# Patient Record
Sex: Female | Born: 1935 | Race: White | Hispanic: No | Marital: Married | Smoking: Never smoker
Health system: Southern US, Community
[De-identification: ages and names within clinical notes are randomized; demographics above are authoritative.]

## PROBLEM LIST (undated history)

## (undated) DIAGNOSIS — M810 Age-related osteoporosis without current pathological fracture: Secondary | ICD-10-CM

## (undated) DIAGNOSIS — I10 Essential (primary) hypertension: Secondary | ICD-10-CM

## (undated) DIAGNOSIS — N183 Chronic kidney disease, stage 3 unspecified: Secondary | ICD-10-CM

## (undated) DIAGNOSIS — Z95818 Presence of other cardiac implants and grafts: Secondary | ICD-10-CM

## (undated) DIAGNOSIS — Z8719 Personal history of other diseases of the digestive system: Secondary | ICD-10-CM

## (undated) DIAGNOSIS — M1990 Unspecified osteoarthritis, unspecified site: Secondary | ICD-10-CM

## (undated) DIAGNOSIS — I442 Atrioventricular block, complete: Secondary | ICD-10-CM

## (undated) DIAGNOSIS — E785 Hyperlipidemia, unspecified: Secondary | ICD-10-CM

## (undated) DIAGNOSIS — Z87898 Personal history of other specified conditions: Secondary | ICD-10-CM

## (undated) DIAGNOSIS — M419 Scoliosis, unspecified: Secondary | ICD-10-CM

## (undated) DIAGNOSIS — R0989 Other specified symptoms and signs involving the circulatory and respiratory systems: Secondary | ICD-10-CM

## (undated) DIAGNOSIS — K219 Gastro-esophageal reflux disease without esophagitis: Secondary | ICD-10-CM

## (undated) DIAGNOSIS — L602 Onychogryphosis: Secondary | ICD-10-CM

## (undated) DIAGNOSIS — N3020 Other chronic cystitis without hematuria: Secondary | ICD-10-CM

## (undated) DIAGNOSIS — R079 Chest pain, unspecified: Secondary | ICD-10-CM

## (undated) DIAGNOSIS — F0390 Unspecified dementia without behavioral disturbance: Secondary | ICD-10-CM

## (undated) DIAGNOSIS — K449 Diaphragmatic hernia without obstruction or gangrene: Secondary | ICD-10-CM

## (undated) DIAGNOSIS — M2040 Other hammer toe(s) (acquired), unspecified foot: Secondary | ICD-10-CM

## (undated) DIAGNOSIS — M549 Dorsalgia, unspecified: Secondary | ICD-10-CM

## (undated) DIAGNOSIS — D509 Iron deficiency anemia, unspecified: Secondary | ICD-10-CM

## (undated) DIAGNOSIS — N859 Noninflammatory disorder of uterus, unspecified: Secondary | ICD-10-CM

## (undated) DIAGNOSIS — N390 Urinary tract infection, site not specified: Secondary | ICD-10-CM

## (undated) DIAGNOSIS — R55 Syncope and collapse: Secondary | ICD-10-CM

## (undated) DIAGNOSIS — G8929 Other chronic pain: Secondary | ICD-10-CM

## (undated) HISTORY — PX: JOINT REPLACEMENT: SHX530

## (undated) HISTORY — DX: Chest pain, unspecified: R07.9

## (undated) HISTORY — PX: TUBAL LIGATION: SHX77

## (undated) HISTORY — DX: Atrioventricular block, complete: I44.2

## (undated) HISTORY — DX: Scoliosis, unspecified: M41.9

## (undated) HISTORY — PX: CATARACT EXTRACTION, BILATERAL: SHX1313

## (undated) HISTORY — DX: Hyperlipidemia, unspecified: E78.5

## (undated) HISTORY — DX: Onychogryphosis: L60.2

## (undated) HISTORY — DX: Diaphragmatic hernia without obstruction or gangrene: K44.9

## (undated) HISTORY — DX: Noninflammatory disorder of uterus, unspecified: N85.9

## (undated) HISTORY — DX: Other hammer toe(s) (acquired), unspecified foot: M20.40

## (undated) HISTORY — DX: Age-related osteoporosis without current pathological fracture: M81.0

## (undated) HISTORY — DX: Personal history of other specified conditions: Z87.898

## (undated) HISTORY — DX: Other specified symptoms and signs involving the circulatory and respiratory systems: R09.89

## (undated) HISTORY — PX: LOOP RECORDER IMPLANT: SHX5954

## (undated) HISTORY — DX: Presence of other cardiac implants and grafts: Z95.818

---

## 2006-01-17 ENCOUNTER — Ambulatory Visit (HOSPITAL_COMMUNITY): Admission: RE | Admit: 2006-01-17 | Discharge: 2006-01-17 | Payer: Self-pay | Admitting: Orthopedic Surgery

## 2006-01-17 ENCOUNTER — Encounter: Payer: Self-pay | Admitting: Vascular Surgery

## 2006-02-21 ENCOUNTER — Ambulatory Visit: Admission: RE | Admit: 2006-02-21 | Discharge: 2006-02-21 | Payer: Self-pay | Admitting: Orthopedic Surgery

## 2006-05-10 HISTORY — PX: TOTAL HIP ARTHROPLASTY: SHX124

## 2006-06-03 ENCOUNTER — Encounter (INDEPENDENT_AMBULATORY_CARE_PROVIDER_SITE_OTHER): Payer: Self-pay | Admitting: *Deleted

## 2006-06-03 ENCOUNTER — Inpatient Hospital Stay (HOSPITAL_COMMUNITY): Admission: RE | Admit: 2006-06-03 | Discharge: 2006-06-07 | Payer: Self-pay | Admitting: Orthopedic Surgery

## 2006-06-04 ENCOUNTER — Ambulatory Visit: Payer: Self-pay | Admitting: Physical Medicine & Rehabilitation

## 2008-06-21 ENCOUNTER — Emergency Department (HOSPITAL_COMMUNITY): Admission: EM | Admit: 2008-06-21 | Discharge: 2008-06-22 | Payer: Self-pay | Admitting: Emergency Medicine

## 2009-11-28 ENCOUNTER — Ambulatory Visit: Payer: Self-pay | Admitting: Vascular Surgery

## 2011-01-22 NOTE — Procedures (Signed)
DUPLEX DEEP VENOUS EXAM - LOWER EXTREMITY   INDICATION:  History of DVT with pain and swelling.   HISTORY:  Edema:  Yes.  Trauma/Surgery:  No.  Pain:  Yes.  PE:  No.  Previous DVT:  Yes.  Anticoagulants:  None.  Other:   DUPLEX EXAM:                CFV   SFV   PopV  PTV    GSV                R  L  R  L  R  L  R   L  R  L  Thrombosis    o  o     o     +      o     o  Spontaneous   +  +     +     p      o     +  Phasic        +  +     +     p      o     +  Augmentation  +  +     +     p      o     +  Compressible  +  +     +     p      o     +  Competent     +  +     +     p      o     +   Legend:  + - yes  o - no  p - partial  D - decreased   IMPRESSION:  Deep venous thrombosis with undetermined age noted in the  left popliteal, peroneal, and posterior tibial veins.    _____________________________  Quita Skye Hart [place], M.D.   MG/MEDQ  D:  11/28/2009  T:  11/29/2009  Job:  [id number]

## 2011-01-22 NOTE — Consult Note (Signed)
NEW PATIENT CONSULTATION   Victoria Lewis, Victoria Lewis  DOB:  04/04/1928                                       11/28/2009  ZOXWR#:[id number]   The patient is an 77 year old female referred by Dr. Tomasa Blase for lower  extremity edema, left worse than right.  This patient apparently began  having some swelling in both legs with the left worse than the right a  few months ago according to her husband.  She has no known history of  DVT, thrombophlebitis and has no history of stasis ulcers, painful  varicosities or superficial thrombophlebitis.  She does not wear elastic  compression stockings.  She has been treated with diuretics over the  past few months with significant improvement in her swelling and states  that at the present time she is not having pain in her feet and ankles  as she was a few months ago.  I have reviewed her records from [facility] including an ultrasound report done by [facility]  11/01/2009 which revealed some nonocclusive thrombus in the left  popliteal vein but otherwise no evidence of DVT in the rest of the left  leg or in the entire right leg.   CHRONIC MEDICAL PROBLEMS:  1. Hypertension.  2. Degenerative joint disease.  3. Previous left hip replacement.  4. Osteopenia.  5. Hyperlipidemia.  6. Negative for diabetes, coronary artery disease, COPD or stroke.   FAMILY HISTORY:  Positive for coronary artery disease and stroke in her  mother.  Negative for diabetes.   SOCIAL HISTORY:  She is married.  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  She does not use tobacco or alcohol.   REVIEW OF SYSTEMS:  Positive for weight loss and gradual loss of  appetite.  Negative for chest pain, dyspnea on exertion.  Does have  urinary frequency.  Lower extremity discomfort in both legs with  walking, joint pain, muscle pain and poor hearing.   PHYSICAL EXAM:  General:  She is an elderly frail-appearing female who  is in no apparent  distress, alert and oriented x3.  HEENT:  EOMs intact.  Conjunctivae normal.  Lungs:  Are clear with no wheezing or rhonchi.  Cardiovascular:  Regular rhythm.  No murmurs.  Carotid pulses 3+.  No  audible bruits.  She has 2+ pulses in all extremities.  Abdomen:  Soft,  nontender with no masses.  Musculoskeletal:  Exam reveals no major  deformities.  Neurologic:  Normal.  Skin:  Free of rashes.  Lower  extremity:  Exam reveals no bulging varicosities.  She does have 1+  edema in the left ankle.  No edema in the right ankle.  There is no  hyperpigmentation, ulceration or diffuse spider veins noted.   Today I ordered a repeat venous duplex exam in our office to compare  with the one done 1 month ago at [facility].  I reviewed and  interpreted this.  She has a DVT in the left popliteal pain of  undetermined origin with some posterior and peroneal vein involvement.  There is no reflux in the left great saphenous system.   She has a chronic DVT in the left leg which may be months or years old,  difficult to know at this time.  I would recommend treating her with an  81 mg aspirin tablet daily and  if she has problems with edema in the  left leg have her fitted for short leg elastic compression stockings.  No other treatment is necessary at this time.  I will see her back on a  p.r.n. basis.     Quita Skye Hart [place], M.D.  Electronically Signed   JDL/MEDQ  D:  11/28/2009  T:  11/29/2009  Job:  [id number]   cc:   Dr Foye Deer

## 2011-01-25 NOTE — H&P (Signed)
NAME:  Victoria Lewis, Victoria Lewis NO.:  [id number]   MEDICAL RECORD NO.:  [id number]            PATIENT TYPE:   LOCATION:                                 FACILITY:   PHYSICIAN:  Deidre Ala, M.D.         DATE OF BIRTH:   DATE OF ADMISSION:  DATE OF DISCHARGE:                              HISTORY & PHYSICAL   CHIEF COMPLAINT:  Left hip pain.   HISTORY OF PRESENT ILLNESS:  The patient is a 77 year old female with a  longstanding history of left hip pain.  She has failed conservative  treatment up to this point.  Radiographs in the office taken showed end-  stage osteoarthritis of the left hip.  Upon these findings and failure  of conservative treatment Dr. Renae Fickle felt it was best to proceed with a  left total hip arthroplasty.  The patient agreed.  The risks and  benefits of the surgery were discussed with the patient and the patient  wished to proceed.   DRUG ALLERGIES:  NO KNOWN DRUG ALLERGIES.   CURRENT MEDICATIONS:  1. Lipitor 20 mg p.o. daily.  2. Zetia 10 mg p.o. daily.  3. Cozaar 100 mg p.o. daily.  4. Metoprolol 50 mg p.o. daily.  5. Fosamax 70 mg p.o. q. week.  6. Calcium one p.o. b.i.d.  7. Vicodin one p.o. q.i.d.  8. Fiber Choice two times daily.  9. B-12 injection once a month.  10.Tandem Plus capsule one p.o. b.i.d.   PAST MEDICAL HISTORY:  1. Hypercholesterolemia.  2. Hypertension.   PAST SURGICAL HISTORY:  Bilateral tubal ligation.   SOCIAL HISTORY:  The patient denies any tobacco or alcohol use.  She is  married.  She also lives in a one story house with 3 steps.   FAMILY HISTORY:  Positive for CVA, hypertension and cancer.   REVIEW OF SYSTEMS:  UROLOGICAL:  Positive for frequency and nocturia.   PHYSICAL EXAMINATION:  Pulse is 68, respirations 20, blood pressure  130/90.  GENERAL:  A well-developed, well-nourished 77 year old female awake,  alert and oriented and cooperative.  HEENT:  Normocephalic, atraumatic.  Pupils equal, round,  reactive to  light.  NECK:  Supple.  No carotid bruits noted.  CHEST:  Clear to auscultation bilaterally.  No wheezes, rhonchi or  crackles.  HEART:  Regular rate and rhythm.  No murmur, rub or gallop.  ABDOMEN:  Soft, nontender, nondistended.  Positive bowel sounds x4.  EXTREMITIES:  Groin pain with range of motion of her left hip.  She  walks with a Trendelenburg gait.  She is neurovascularly intact distally  to the left lower extremity.  SKIN:  Within normal limits.   RADIOGRAPHS:  Show end-stage osteoarthritis of the left hip with bone on  bone contact in left hip joint.   IMPRESSION:  End-stage osteoarthritis of the left hip.   PLAN:  The patient will be admitted to [facility] to undergo a  left total hip arthroplasty by Dr. Charlesetta Shanks.     ______________________________  Clarene Reamer, P.A.-C.    ______________________________  V. Charlesetta Shanks,  M.D.    SW/MEDQ  D:  07/22/2006  T:  07/22/2006  Job:  [id number]

## 2011-01-25 NOTE — Discharge Summary (Signed)
NAMEIRETHA, KIRLEY NO.:  [id number]   MEDICAL RECORD NO.:  [id number]          PATIENT TYPE:  INP   LOCATION:  1506                         FACILITY:  [facility]   PHYSICIAN:  Deidre Ala, M.D.    DATE OF BIRTH:  [birth year]   DATE OF ADMISSION:  06/03/2006  DATE OF DISCHARGE:  06/07/2006                                 DISCHARGE SUMMARY   ADMISSION DIAGNOSES:  1. End-stage osteoarthritis of the left hip.  2. Hypercholesterolemia.  3. Hypertension.   DISCHARGE DIAGNOSES:  1. End-stage osteoarthritis of the left hip status post left total hip      arthroplasty.  2. Hypercholesterolemia.  3. Hypertension.  4. Postop hemorrhagic anemia, stable at the time of discharge.   PROCEDURE:  The patient was taken to the operating room on June 03, 2006, and underwent a left total hip arthroplasty.   SURGEON:  1. Charlesetta Shanks, M.D.   ASSISTANT:  Clarene Reamer, P.A.-C.   ANESTHESIA:  General.   CONSULTATIONS:  Physical Therapy.  Occupational Therapy.  Social Work Case Management.  Physical Medicine Rehabilitation with Dr. Thomasena Edis.   BRIEF HISTORY:  The patient is a 77 year old female with a long-standing  history of left hip pain.  She has failed conservative treatment to this  point.  Radiographs in the office revealed end-stage osteoarthritis of the  left hip with deformity of the femoral head.  Upon these findings and  failure of conservative treatment, Dr. Renae Fickle thought that it was best to  proceed with a left total hip arthroplasty.  The patient agreed.  Risks and  benefits of the surgery were discussed with the patient and the patient  wished to proceed.   LABORATORY DATA:  CBC on admission showed a hemoglobin and hematocrit of  13.5 and 39.2, white blood cell count 7.1, red blood cell count 4.3.  Serial  H&Hs were performed throughout hospital stay.  Hemoglobin and hematocrit did  decline to 9.5 and 27 on September 28, but were stable at the time of  discharge.  Differential on admission was all within normal limits.  Coagulation studies on admission were all within normal limits.  PT INR at  the time of discharge were 21.7 and 1.8 respectively on Coumadin therapy.  Routine chemistry on admission showed a glucose high at 149.  Serial  chemistries were followed throughout hosp stay.  Glucose ranged from a low  of 100 to a high of 149.  Urinalysis showed a moderate amount of leucocyte  esterase with few epithelials.  The patient's blood type is O+ with antibody  screen negative.  Postop hip x-rays revealed left total hip arthroplasty  devices in place.  Hardware components are in anatomic alignment.   HOSPITAL COURSE:  The patient was admitted to [facility] and taken  to the operating room where she underwent the above stated procedure without  complications.  The patient tolerated the procedure well and was returned  from recovery room to the orthopedic floor continuing her postop care.  On  postop day #1, the patient was complaining of some pain as expected.  Hemoglobin and hematocrit are 10.5 and 30.2.  She is neurovascularly intact  in the left lower extremity.  Dressing was clean, dry and intact.  The  patient is to work with physical therapy and occupational therapy.  On  September 27, postop day #2, T-max is 99.1. Hemoglobin and hematocrit are  10.1 and 28.8.  Incision was clean dry and intact.  She was neurovascularly  intact in the left lower extremity.  PCA and Hemovac were discontinued on  this date.  She is to continue to work with physical therapy and  occupational therapy.  Rehab consult was seen.  It was felt that she would  be better for home health.  On September 28, postop day #3, the patient is  resting comfortably; still a little slow with therapy.  T-max is 99.1.  Hemoglobin and hematocrit are 9.5 and 27.  She is neurovascularly intact to  the left lower extremity.  Incision was clean, dry and intact.  The  patient  was to work with physical therapy for one more day with the plan to be  discharged home the following day.  On June 07, 2006, postop day #4,  the patient was doing well, ready to go home.  She was afebrile.  Neurovascularly intact to the left lower extremity.  Incision was clean, dry  and intact.  The patient was to be subsequently discharged home on this  date.   DISPOSITION:  The patient was discharged home on June 07, 2006.   DISCHARGE MEDICATIONS:  1. Percocet one to two p.o. q4-6h. p.r.n. pain.  2. Robaxin 500 mg one p.o. q.6-8h. p.r.n. spasm.  3. Coumadin per pharmacy protocol.   DISCHARGE DIET:  Diet as tolerated.   DISCHARGE ACTIVITIES:  The patient is weightbearing as tolerated to the left  lower extremity.   WOUND CARE:  The patient is to perform daily dressing changes until no  drainage.  She may shower when no drainage.   DISCHARGE FOLLOWUP:  The patient is to follow up with Dr. Renae Fickle 2 weeks from  the date of surgery.  The office is to be called for an appointment at [phone].   CONDITION ON DISCHARGE:  Stable and improved.     ______________________________  Clarene Reamer, P.A.-C.    ______________________________  V. Charlesetta Shanks, M.D.    SW/MEDQ  D:  06/24/2006  T:  06/24/2006  Job:  [id number]

## 2011-01-25 NOTE — Op Note (Signed)
Victoria Lewis, Victoria Lewis NO.:  [id number]   MEDICAL RECORD NO.:  [id number]          PATIENT TYPE:  INP   LOCATION:  1506                         FACILITY:  [facility]   PHYSICIAN:  Deidre Ala, M.D.    DATE OF BIRTH:  [birth year]   DATE OF PROCEDURE:  06/03/2006  DATE OF DISCHARGE:                                 OPERATIVE REPORT   PREOPERATIVE DIAGNOSIS:  1. End-stage DJD, left hip.  2. Large peri-acetabular cyst.   PROCEDURE:  1. Left total hip arthroplasty using uncemented DePuy components.  2. Excision of partial peri-acetabular cyst.   SURGEON:  1. Charlesetta Shanks, M.D.   ASSISTANT:  Clarene Reamer, [facility]   ANESTHESIA:  General endotracheal.   CULTURES:  None.   DRAINS:  Two medium Hemovacs to self suction.   ESTIMATED BLOOD LOSS:  300 cc.   HISTORY:  Victoria Lewis is a 77 year old female with long-term history of  degenerative joint disease of the left hip.   FINDINGS:  She had superior acetabular wear.  She had good bone medially  with intact columns.  She had preoperative  acetabular cyst that was quite  large. It does go up and down in size, according to the patient.  I did see  it in the anterior capsule and resected as much as I safely could and sent  it for pathology.  We were able to fit her with a 56 mm acetabulum, 36 mm  inside diameter, 10 degree acetabular liner +4, with a tapered hip stem,  size 5, 12/14 taper, standard offset, hole eliminator, acetabular sector cup  56 mm, and humeral head was 1+5, 36 mm.  We achieved push-pull stability.  We had hip extension to full with 90 degrees of flexion, 90 degrees of knee  flexion.  We had no instability to external rotation and extension, flexion  with internal rotation produced stability up to 85 to almost 90 degrees.  We  had the posterior lip at about 4 o'clock with very good anteversion of the  stem as well as the cup.   DESCRIPTION OF PROCEDURE:  After adequate anesthesia was obtained using  endotracheal technique, 1 gram Ancef given IV prophylaxis.  The patient was  placed in the right lateral decubitus position with the left side up.  After  standard prepping and draping of the left hip, we then did a standard Romilda Joy type posterior hip incision.  The incision was deepened sharply and  adequate hemostasis obtained using the Bovie electrocoagulator.  Dissection  was carried down to the iliotibial band and gluteus fascia which was incised  longitudinally.  We then placed a Charnley retractor.  I then internally  rotated the hip and resected the short hip flexors, hip external rotators.  I then cut and tagged the piriformis.  I then tagged the capsule and made a  capsulotomy with a flap based medial and reflected the capsule back.  I then  dislocated the femoral head and cleared the neck and piriformis area of soft  tissue.  I then made a cut using the broach template at mid neck and  removed  the femoral head.  I then exposed the acetabulum and did a labrectomy,  preserving the posterior capsule.  Retractors were placed and I started  reaming centrally,  I then reamed up in the more inferior mid acetabulum,  the true acetabulum just above the fovea centralis and reamed up to a 55.  I  then trialed a 54.  I then reamed with a 56 on the face. I then implanted  the 56 with good tightness.  I did use a sector cup but I did not need the  holes because it was very solid.  I then placed the 10 degree liner as  above, trial.  I then exposed the proximal femur.  I further shaved and  trimmed the neck.  I then did successive reaming to a #6.  I then broached  to a 5 with the appropriate anteversion, trying to stay lateral.  I then  placed the trial broach in place with the standard neck and +0 head and  articulated the hip through a range of motion with the above findings.  I  then removed the trial components, exposed the acetabulum, and implanted the  final acetabular insert +4, 10  degree liner, 36 mm inner diameter, 56 mm  outer diameter and impacted it.  I then exposed the proximal femur and  implanted the 5 standard tapered hip stem.  I then  impacted on the femoral  head trial and put it through a range of motion to +0.  I then implanted the  0 femoral head and cold welded it with the impaction.  The joint was then  articulated and put through a range of motion.  Thorough irrigation was  carried out.  I then placed Hemovac drains in the medial lateral gutters.  I  then closed the capsule flat back to the posterior trochanter using drill  holes with a horizontal mattress suture of double-stranded #1 Ethibond.  The  wound was then closed in layers with #1 Vicryl on the fascia and with a  running locking #1 PDS oversew, 0 and 2-0 Vicryl in the subcu, and skin  staples.  Hemovac was hooked up to Autovac.  The patient then had a knee  immobilizer placed to prevent dislocation.  The patient then, having  tolerated procedure well, was taken to recovery room in satisfactory  condition for routine postoperative care.           ______________________________  V. Charlesetta Shanks, M.D.     VEP/MEDQ  D:  06/03/2006  T:  06/05/2006  Job:  [id number]   cc:   Laurell Roof  Fax: [phone]

## 2011-06-11 LAB — DIFFERENTIAL
Basophils Absolute: 0
Basophils Relative: 0
Eosinophils Absolute: 0.1
Eosinophils Relative: 1
Lymphocytes Relative: 6 — ABNORMAL LOW
Lymphs Abs: 0.7
Monocytes Absolute: 0.7
Monocytes Relative: 6
Neutro Abs: 10.4 — ABNORMAL HIGH
Neutrophils Relative %: 87 — ABNORMAL HIGH

## 2011-06-11 LAB — LIPASE, BLOOD: Lipase: 26

## 2011-06-11 LAB — POCT CARDIAC MARKERS
CKMB, poc: 2.1
CKMB, poc: 2.4
Troponin i, poc: <0.05

## 2011-06-11 LAB — COMPREHENSIVE METABOLIC PANEL WITH GFR
BUN: 17
CO2: 29
Chloride: 100
Creatinine, Ser: 1.46 — ABNORMAL HIGH
GFR calc Af Amer: 42 — ABNORMAL LOW
Sodium: 136
Total Bilirubin: 0.8

## 2011-06-11 LAB — COMPREHENSIVE METABOLIC PANEL
ALT: 14
AST: 25
Albumin: 3.8
Alkaline Phosphatase: 81
Calcium: 9.2
GFR calc non Af Amer: 35 — ABNORMAL LOW
Glucose, Bld: 131 — ABNORMAL HIGH
Potassium: 4.5
Total Protein: 6.1

## 2011-06-11 LAB — URINALYSIS, ROUTINE W REFLEX MICROSCOPIC
Bilirubin Urine: NEGATIVE
Hgb urine dipstick: NEGATIVE
Nitrite: NEGATIVE
Specific Gravity, Urine: 1.01

## 2011-06-11 LAB — CBC
HCT: 37.2
Hemoglobin: 12.5
MCHC: 33.5
MCV: 96.2
Platelets: 228
RBC: 3.86 — ABNORMAL LOW
RDW: 13.1
WBC: 11.9 — ABNORMAL HIGH

## 2011-06-11 LAB — URINE MICROSCOPIC-ADD ON

## 2013-10-05 ENCOUNTER — Other Ambulatory Visit: Payer: Self-pay | Admitting: Orthopedic Surgery

## 2013-10-07 ENCOUNTER — Encounter (HOSPITAL_COMMUNITY): Payer: Self-pay | Admitting: Pharmacy Technician

## 2013-10-07 ENCOUNTER — Encounter (HOSPITAL_COMMUNITY)
Admission: RE | Admit: 2013-10-07 | Discharge: 2013-10-07 | Disposition: A | Discharge status: Home / Self Care | Payer: Medicare Other | Source: Ambulatory Visit | Attending: Orthopedic Surgery | Admitting: Orthopedic Surgery

## 2013-10-07 ENCOUNTER — Encounter (HOSPITAL_COMMUNITY): Payer: Self-pay

## 2013-10-07 DIAGNOSIS — Z01812 Encounter for preprocedural laboratory examination: Secondary | ICD-10-CM | POA: Insufficient documentation

## 2013-10-07 DIAGNOSIS — Z01810 Encounter for preprocedural cardiovascular examination: Secondary | ICD-10-CM | POA: Insufficient documentation

## 2013-10-07 DIAGNOSIS — Z01811 Encounter for preprocedural respiratory examination: Secondary | ICD-10-CM | POA: Insufficient documentation

## 2013-10-07 DIAGNOSIS — Z01818 Encounter for other preprocedural examination: Secondary | ICD-10-CM | POA: Insufficient documentation

## 2013-10-07 HISTORY — DX: Gastro-esophageal reflux disease without esophagitis: K21.9

## 2013-10-07 HISTORY — DX: Unspecified osteoarthritis, unspecified site: M19.90

## 2013-10-07 HISTORY — DX: Personal history of other diseases of the digestive system: Z87.19

## 2013-10-07 HISTORY — DX: Essential (primary) hypertension: I10

## 2013-10-07 LAB — PROTIME-INR
INR: 0.94 (ref 0.00–1.49)
Prothrombin Time: 12.4 seconds (ref 11.6–15.2)

## 2013-10-07 LAB — CBC WITH DIFFERENTIAL/PLATELET
BASOS PCT: 2 % — AB (ref 0–1)
Basophils Absolute: 0.1 10*3/uL (ref 0.0–0.1)
EOS ABS: 0.7 10*3/uL (ref 0.0–0.7)
Eosinophils Relative: 12 % — ABNORMAL HIGH (ref 0–5)
HEMATOCRIT: 29 % — AB (ref 36.0–46.0)
Hemoglobin: 9.5 g/dL — ABNORMAL LOW (ref 12.0–15.0)
Lymphocytes Relative: 16 % (ref 12–46)
Lymphs Abs: 1 10*3/uL (ref 0.7–4.0)
MCH: 26.5 pg (ref 26.0–34.0)
MCHC: 32.8 g/dL (ref 30.0–36.0)
MCV: 81 fL (ref 78.0–100.0)
MONO ABS: 0.6 10*3/uL (ref 0.1–1.0)
MONOS PCT: 9 % (ref 3–12)
NEUTROS ABS: 3.7 10*3/uL (ref 1.7–7.7)
Neutrophils Relative %: 61 % (ref 43–77)
Platelets: 361 10*3/uL (ref 150–400)
RBC: 3.58 MIL/uL — ABNORMAL LOW (ref 3.87–5.11)
RDW: 16.6 % — ABNORMAL HIGH (ref 11.5–15.5)
WBC: 6.1 10*3/uL (ref 4.0–10.5)

## 2013-10-07 LAB — BASIC METABOLIC PANEL
BUN: 16 mg/dL (ref 6–23)
CHLORIDE: 100 meq/L (ref 96–112)
CO2: 22 mEq/L (ref 19–32)
Calcium: 9 mg/dL (ref 8.4–10.5)
Creatinine, Ser: 1.19 mg/dL — ABNORMAL HIGH (ref 0.50–1.10)
GFR calc Af Amer: 48 mL/min — ABNORMAL LOW (ref >90)
GFR calc non Af Amer: 41 mL/min — ABNORMAL LOW (ref >90)
Glucose, Bld: 89 mg/dL (ref 70–99)
Potassium: 4.8 mEq/L (ref 3.7–5.3)
SODIUM: 137 meq/L (ref 137–147)

## 2013-10-07 LAB — ABO/RH: ABO/RH(D): O POS

## 2013-10-07 LAB — APTT: aPTT: 32 seconds (ref 24–37)

## 2013-10-07 LAB — SURGICAL PCR SCREEN
MRSA, PCR: NEGATIVE
Staphylococcus aureus: NEGATIVE

## 2013-10-07 NOTE — Pre-Procedure Instructions (Addendum)
Tera HelperBetty C Strieter  10/07/2013   Your procedure is scheduled on:  MONDAY, FEBRUARY 2.  [facility] Main [facility]trance / Entrance "A" at 9:30 AM.  Call this number if you have problems the morning of surgery: [phone]   Remember:   Do not eat food or drink liquids after midnight Sunday.   Take these medicines the morning of surgery with A SIP OF WATER: -Hydrocodone if needed for pain, Metoprolol, and Prilosec. Stop Aspirin, Vitamins,  herbal medication and NSAIDS as today   Do not wear jewelry, make-up or nail polish.  Do not wear lotions, powders, or perfumes. You may wear deodorant.  Do not shave 48 hours prior to surgery.   Do not bring valuables to the hospital.  [facility] belongings or valuables.               Contacts, dentures or bridgework may not be worn into surgery.  Leave suitcase in the car. After surgery it may be brought to your room.  For patients admitted to the hospital, discharge time is determined by your treatment team.                Special Instructions: Shower using CHG 2 nights before surgery and the night before surgery.  If you shower the day of surgery use CHG.  Use special wash - you have one bottle of CHG for all showers.  You should use approximately 1/3 of the bottle for each shower.   Please read over the following fact sheets that you were given: Pain Booklet, Coughing and Deep Breathing, Blood Transfusion Information and Surgical Site Infection Prevention

## 2013-10-07 NOTE — H&P (Signed)
TOTAL HIP ADMISSION H&P  Patient is admitted for right total hip arthroplasty.  Subjective:  Chief Complaint: right hip pain  HPI: Victoria Lewis, 80 y.o. female, has a history of pain and functional disability in the right hip(s) due to arthritis and patient has failed non-surgical conservative treatments for greater than 12 weeks to include NSAID's and/or analgesics, flexibility and strengthening excercises, use of assistive devices and activity modification.  Onset of symptoms was gradual starting >10 years ago with gradually worsening course since that time.The patient noted no past surgery on the right hip(s).  Patient currently rates pain in the right hip at 10 out of 10 with activity. Patient has night pain, worsening of pain with activity and weight bearing, pain that interfers with activities of daily living and pain with passive range of motion. Patient has evidence of subchondral sclerosis, joint subluxation and joint space narrowing by imaging studies. This condition presents safety issues increasing the risk of falls. This patient has had avascular necrosis of the hip, acetabular fracture, hip dysplasia.  There is no current active infection.  There are no active problems to display for this patient.  Past Medical History  Diagnosis Date  . Hypertension   . GERD (gastroesophageal reflux disease)   . H/O hiatal hernia   . Arthritis   . Anemia     iron    Past Surgical History  Procedure Laterality Date  . Joint replacement Left 2008  . Eye surgery Bilateral     cataracts     No prescriptions prior to admission   No Known Allergies  History  Substance Use Topics  . Smoking status: Never Smoker   . Smokeless tobacco: Never Used  . Alcohol Use: No    No family history on file.   Review of Systems  Constitutional: Negative.   HENT: Negative.   Eyes: Negative.   Respiratory: Negative.   Cardiovascular: Negative.   Gastrointestinal: Negative.   Genitourinary:  Negative.   Musculoskeletal: Positive for joint pain.  Skin: Negative.   Neurological: Negative.   Psychiatric/Behavioral: Negative.     Objective:  Physical Exam  Constitutional: She is oriented to person, place, and time. She appears well-developed and well-nourished.  HENT:  Head: Normocephalic and atraumatic.  Eyes: Pupils are equal, round, and reactive to light.  Neck: Normal range of motion.  Cardiovascular: Intact distal pulses.   Respiratory: Effort normal.  Musculoskeletal: She exhibits tenderness.  Neurological: She is alert and oriented to person, place, and time.  Skin: Skin is warm and dry.  Psychiatric: She has a normal mood and affect. Her behavior is normal. Judgment and thought content normal.    Vital signs in last 24 hours: Temp:  [98.3 F (36.8 C)] 98.3 F (36.8 C) (01/29 1251) Pulse Rate:  [100] 100 (01/29 1251) Resp:  [18] 18 (01/29 1251) BP: (148)/(85) 148/85 mmHg (01/29 1251) SpO2:  [99 %] 99 % (01/29 1251) Weight:  [64.864 kg (143 lb)] 64.864 kg (143 lb) (01/29 1251)  Labs:   There is no height or weight on file to calculate BMI.   Imaging Review X-rays around the sector system are reviewed showing end-stage arthritis, high neck shaft angle flattening of the femoral head, which is 50% subluxed laterally and superiorly.  She did have a CT scan done that is on the [facility] and was reviewed showing good anterior and posterior columns and mild superior acetabular deficiency.  Assessment/Plan:  End stage arthritis, right hip(s)  The patient history,  physical examination, clinical judgement of the provider and imaging studies are consistent with end stage degenerative joint disease of the right hip(s) and total hip arthroplasty is deemed medically necessary. The treatment options including medical management, injection therapy, arthroscopy and arthroplasty were discussed at length. The risks and benefits of total hip arthroplasty were presented  and reviewed. The risks due to aseptic loosening, infection, stiffness, dislocation/subluxation,  thromboembolic complications and other imponderables were discussed.  The patient acknowledged the explanation, agreed to proceed with the plan and consent was signed. Patient is being admitted for inpatient treatment for surgery, pain control, PT, OT, prophylactic antibiotics, VTE prophylaxis, progressive ambulation and ADL's and discharge planning.The patient is planning to be discharged to skilled nursing facility

## 2013-10-08 NOTE — Progress Notes (Signed)
Anesthesia Chart Review:  Patient is a 80 year old female scheduled for right THA on 10/11/13 by Dr. Turner Danielsowan. History includes non-smoker, GERD, hiatal hernia, HTN, arthritis, anemia, syncope '13. PCP is Dr. Foye Deerouglas Schultz who medically cleared patient for this procedure.    She was also referred to cardiologist Dr. Ledora BottcherKrasowki for cardiac clearance.  He initially saw her around 11/2012 for syncope.  By notes, her stress, echo, and event monitor were unremarkable.  In the future he may consider an implantable loop recorder. He did recommend further evaluation by GI Dr. Jennye BoroughsMisenheimer for her anemia since post-operative anticoagluation would be anticipated.  She was since undergone 1) EGD (10/05/13)showing esophageal stricture s/p dilation and large hiatal hernia, and 2) colonoscopy (10/05/13) showing mild pan-diverticular disease, perirectal inflammation, external hemorrhoids, but biopsies showed no changes of sprue, active inflammation, or granulomas.   EKG on 10/07/13 showed ST @ 103 bpm, low voltage QRS, LAD, anterior infarct (age undetermined).  Nuclear stress test on 11/23/12 [facility] showed no evidence of ischemia, normal wall motion and thickening, EF 60%.  Echo 07/28/12 [facility] showed: Normal global left ventricular contractility, overall left ventricular systolic function is normal with EF 65-70%, no regional wall motion abnormalities, mildly dilated left atrium, mild mitral annular calcification, mild mitral regurgitation, mild tricuspid regurgitation.  CXR on 10/07/13 showed no active cardiopulmonary disease.  Preoperative labs noted.  H/H 9.5/29.0 which is stable when compared to labs from 10/05/[facility] (Dr. Meredeth IdeMiseheimer). T&S has already been done. [facility] and [facility] Dr. Wadie Lessenowan's office.  Patient has had recent GI and cardiology work-up.  H/H appears stable.  She has medical and cardiac clearances.  If no acute changes then I anticipate that she can  proceed as planned.  Velna Ochsllison Janith Nielson, PA-C [facility]Anesthesiology Phone [phone] 10/08/2013 11:23 AM

## 2013-10-10 MED ORDER — CEFAZOLIN SODIUM-DEXTROSE 2-3 GM-% IV SOLR
2.0000 g | INTRAVENOUS | Status: AC
  Administered 2013-10-11: 2 g via INTRAVENOUS
  Filled 2013-10-10: qty 50

## 2013-10-11 ENCOUNTER — Inpatient Hospital Stay (HOSPITAL_COMMUNITY): Payer: Medicare Other

## 2013-10-11 ENCOUNTER — Inpatient Hospital Stay (HOSPITAL_COMMUNITY)
Admission: RE | Admit: 2013-10-11 | Discharge: 2013-10-15 | DRG: 470 | Disposition: A | Discharge status: Skilled Nursing Facility | Payer: Medicare Other | Source: Ambulatory Visit | Attending: Orthopedic Surgery | Admitting: Orthopedic Surgery

## 2013-10-11 ENCOUNTER — Encounter (HOSPITAL_COMMUNITY): Payer: Medicare Other | Admitting: Vascular Surgery

## 2013-10-11 ENCOUNTER — Encounter (HOSPITAL_COMMUNITY)
Admission: RE | Disposition: A | Discharge status: Skilled Nursing Facility | Payer: Self-pay | Source: Ambulatory Visit | Attending: Orthopedic Surgery

## 2013-10-11 ENCOUNTER — Inpatient Hospital Stay (HOSPITAL_COMMUNITY): Payer: Medicare Other | Admitting: Anesthesiology

## 2013-10-11 ENCOUNTER — Encounter (HOSPITAL_COMMUNITY): Payer: Self-pay | Admitting: Anesthesiology

## 2013-10-11 DIAGNOSIS — N390 Urinary tract infection, site not specified: Secondary | ICD-10-CM | POA: Diagnosis present

## 2013-10-11 DIAGNOSIS — M169 Osteoarthritis of hip, unspecified: Principal | ICD-10-CM | POA: Diagnosis present

## 2013-10-11 DIAGNOSIS — K219 Gastro-esophageal reflux disease without esophagitis: Secondary | ICD-10-CM | POA: Diagnosis present

## 2013-10-11 DIAGNOSIS — D62 Acute posthemorrhagic anemia: Secondary | ICD-10-CM | POA: Diagnosis not present

## 2013-10-11 DIAGNOSIS — M1610 Unilateral primary osteoarthritis, unspecified hip: Principal | ICD-10-CM | POA: Diagnosis present

## 2013-10-11 DIAGNOSIS — Q6589 Other specified congenital deformities of hip: Secondary | ICD-10-CM

## 2013-10-11 DIAGNOSIS — I10 Essential (primary) hypertension: Secondary | ICD-10-CM | POA: Diagnosis present

## 2013-10-11 DIAGNOSIS — M87059 Idiopathic aseptic necrosis of unspecified femur: Secondary | ICD-10-CM | POA: Diagnosis present

## 2013-10-11 HISTORY — PX: TOTAL HIP ARTHROPLASTY: SHX124

## 2013-10-11 LAB — PREPARE RBC (CROSSMATCH)

## 2013-10-11 SURGERY — ARTHROPLASTY, HIP, TOTAL,POSTERIOR APPROACH
Anesthesia: General | Site: Hip | Laterality: Right

## 2013-10-11 MED ORDER — BISACODYL 5 MG PO TBEC
5.0000 mg | DELAYED_RELEASE_TABLET | Freq: Every day | ORAL | Status: DC | PRN
  Administered 2013-10-12: 5 mg via ORAL
  Filled 2013-10-11: qty 1

## 2013-10-11 MED ORDER — OXYCODONE HCL 5 MG/5ML PO SOLN
ORAL | Status: AC
  Filled 2013-10-11: qty 10

## 2013-10-11 MED ORDER — PROPOFOL 10 MG/ML IV BOLUS
INTRAVENOUS | Status: DC | PRN
  Administered 2013-10-11: 100 mg via INTRAVENOUS

## 2013-10-11 MED ORDER — ONDANSETRON HCL 4 MG PO TABS: 4.0000 mg | ORAL_TABLET | Freq: Four times a day (QID) | ORAL | Status: DC | PRN

## 2013-10-11 MED ORDER — OXYCODONE HCL 5 MG PO TABS
5.0000 mg | ORAL_TABLET | ORAL | Status: DC | PRN
  Administered 2013-10-11: 10 mg via ORAL

## 2013-10-11 MED ORDER — GLYCOPYRROLATE 0.2 MG/ML IJ SOLN
INTRAMUSCULAR | Status: DC | PRN
  Administered 2013-10-11: 0.2 mg via INTRAVENOUS

## 2013-10-11 MED ORDER — PREGABALIN 50 MG PO CAPS
50.0000 mg | ORAL_CAPSULE | Freq: Every day | ORAL | Status: DC
  Administered 2013-10-11 – 2013-10-14 (×4): 50 mg via ORAL
  Filled 2013-10-11 (×4): qty 1

## 2013-10-11 MED ORDER — ONDANSETRON HCL 4 MG/2ML IJ SOLN
INTRAMUSCULAR | Status: DC | PRN
Start: 2013-10-11 — End: 2013-10-11
  Administered 2013-10-11: 4 mg via INTRAVENOUS

## 2013-10-11 MED ORDER — PROMETHAZINE HCL 25 MG/ML IJ SOLN: 6.2500 mg | INTRAMUSCULAR | Status: DC | PRN

## 2013-10-11 MED ORDER — METOPROLOL TARTRATE 50 MG PO TABS
50.0000 mg | ORAL_TABLET | Freq: Two times a day (BID) | ORAL | Status: DC
  Administered 2013-10-11 – 2013-10-15 (×8): 50 mg via ORAL
  Filled 2013-10-11 (×9): qty 1

## 2013-10-11 MED ORDER — ONDANSETRON HCL 4 MG/2ML IJ SOLN: 4.0000 mg | Freq: Four times a day (QID) | INTRAMUSCULAR | Status: DC | PRN

## 2013-10-11 MED ORDER — OXYCODONE HCL 5 MG PO TABS: 5.0000 mg | ORAL_TABLET | Freq: Once | ORAL | Status: DC | PRN

## 2013-10-11 MED ORDER — CHLORHEXIDINE GLUCONATE 4 % EX LIQD: 1.0000 "application " | Freq: Once | CUTANEOUS | Status: DC

## 2013-10-11 MED ORDER — METOCLOPRAMIDE HCL 10 MG PO TABS: 5.0000 mg | ORAL_TABLET | Freq: Three times a day (TID) | ORAL | Status: DC | PRN

## 2013-10-11 MED ORDER — BUPIVACAINE-EPINEPHRINE (PF) 0.5% -1:200000 IJ SOLN
INTRAMUSCULAR | Status: AC
  Filled 2013-10-11: qty 10

## 2013-10-11 MED ORDER — ACETAMINOPHEN 650 MG RE SUPP
650.0000 mg | Freq: Four times a day (QID) | RECTAL | Status: DC | PRN
Start: 2013-10-11 — End: 2013-10-15

## 2013-10-11 MED ORDER — PROPOFOL 10 MG/ML IV BOLUS
INTRAVENOUS | Status: AC
  Filled 2013-10-11: qty 20

## 2013-10-11 MED ORDER — MENTHOL 3 MG MT LOZG: 1.0000 | LOZENGE | OROMUCOSAL | Status: DC | PRN

## 2013-10-11 MED ORDER — LACTATED RINGERS IV SOLN
INTRAVENOUS | Status: DC | PRN
  Administered 2013-10-11: 12:00:00 via INTRAVENOUS

## 2013-10-11 MED ORDER — CHLORHEXIDINE GLUCONATE 4 % EX LIQD: 60.0000 mL | Freq: Once | CUTANEOUS | Status: DC

## 2013-10-11 MED ORDER — LIDOCAINE HCL (CARDIAC) 20 MG/ML IV SOLN
INTRAVENOUS | Status: AC
  Filled 2013-10-11: qty 5

## 2013-10-11 MED ORDER — DIPHENHYDRAMINE HCL 12.5 MG/5ML PO ELIX: 12.5000 mg | ORAL_SOLUTION | ORAL | Status: DC | PRN

## 2013-10-11 MED ORDER — FENTANYL CITRATE 0.05 MG/ML IJ SOLN
INTRAMUSCULAR | Status: DC | PRN
  Administered 2013-10-11: 50 ug via INTRAVENOUS
  Administered 2013-10-11: 25 ug via INTRAVENOUS

## 2013-10-11 MED ORDER — ASPIRIN EC 325 MG PO TBEC
325.0000 mg | DELAYED_RELEASE_TABLET | Freq: Every day | ORAL | Status: DC
  Administered 2013-10-12 – 2013-10-15 (×4): 325 mg via ORAL
  Filled 2013-10-11 (×5): qty 1

## 2013-10-11 MED ORDER — METOPROLOL TARTRATE 12.5 MG HALF TABLET
ORAL_TABLET | ORAL | Status: AC
  Filled 2013-10-11: qty 2

## 2013-10-11 MED ORDER — LIDOCAINE HCL (CARDIAC) 20 MG/ML IV SOLN
INTRAVENOUS | Status: DC | PRN
  Administered 2013-10-11: 60 mg via INTRAVENOUS

## 2013-10-11 MED ORDER — LACTATED RINGERS IV SOLN
INTRAVENOUS | Status: DC
  Administered 2013-10-11: 10:00:00 via INTRAVENOUS

## 2013-10-11 MED ORDER — METOPROLOL TARTRATE 50 MG PO TABS
ORAL_TABLET | ORAL | Status: DC
Start: 2013-10-11 — End: 2013-10-11
  Filled 2013-10-11: qty 1

## 2013-10-11 MED ORDER — METOCLOPRAMIDE HCL 5 MG/ML IJ SOLN: 5.0000 mg | Freq: Three times a day (TID) | INTRAMUSCULAR | Status: DC | PRN

## 2013-10-11 MED ORDER — BUPIVACAINE-EPINEPHRINE 0.5% -1:200000 IJ SOLN
INTRAMUSCULAR | Status: DC | PRN
  Administered 2013-10-11: 20 mL

## 2013-10-11 MED ORDER — LOSARTAN POTASSIUM 50 MG PO TABS
100.0000 mg | ORAL_TABLET | Freq: Every day | ORAL | Status: DC
  Administered 2013-10-11 – 2013-10-15 (×5): 100 mg via ORAL
  Filled 2013-10-11 (×5): qty 2

## 2013-10-11 MED ORDER — TRANEXAMIC ACID 100 MG/ML IV SOLN
1000.0000 mg | INTRAVENOUS | Status: AC
  Administered 2013-10-11: 1000 mg via INTRAVENOUS
  Filled 2013-10-11: qty 10

## 2013-10-11 MED ORDER — GLYCOPYRROLATE 0.2 MG/ML IJ SOLN
INTRAMUSCULAR | Status: AC
  Filled 2013-10-11: qty 2

## 2013-10-11 MED ORDER — ATORVASTATIN CALCIUM 40 MG PO TABS
40.0000 mg | ORAL_TABLET | Freq: Every day | ORAL | Status: DC
  Administered 2013-10-11 – 2013-10-14 (×4): 40 mg via ORAL
  Filled 2013-10-11 (×5): qty 1

## 2013-10-11 MED ORDER — METOPROLOL TARTRATE 50 MG PO TABS
50.0000 mg | ORAL_TABLET | Freq: Two times a day (BID) | ORAL | Status: DC
  Filled 2013-10-11 (×2): qty 1

## 2013-10-11 MED ORDER — ACETAMINOPHEN 325 MG PO TABS
650.0000 mg | ORAL_TABLET | Freq: Four times a day (QID) | ORAL | Status: DC | PRN
  Administered 2013-10-12 (×2): 650 mg via ORAL
  Filled 2013-10-11 (×2): qty 2

## 2013-10-11 MED ORDER — SODIUM CHLORIDE 0.9 % IR SOLN
Status: DC | PRN
  Administered 2013-10-11: 1000 mL

## 2013-10-11 MED ORDER — CIPROFLOXACIN HCL 250 MG PO TABS
250.0000 mg | ORAL_TABLET | Freq: Two times a day (BID) | ORAL | Status: DC
  Administered 2013-10-11 – 2013-10-13 (×4): 250 mg via ORAL
  Filled 2013-10-11 (×8): qty 1

## 2013-10-11 MED ORDER — ARTIFICIAL TEARS OP OINT
TOPICAL_OINTMENT | OPHTHALMIC | Status: AC
  Filled 2013-10-11: qty 3.5

## 2013-10-11 MED ORDER — HYDROMORPHONE HCL PF 1 MG/ML IJ SOLN
0.2500 mg | INTRAMUSCULAR | Status: DC | PRN
  Administered 2013-10-11 (×2): 0.5 mg via INTRAVENOUS

## 2013-10-11 MED ORDER — MAGNESIUM CITRATE PO SOLN: 1.0000 | Freq: Once | ORAL | Status: AC | PRN

## 2013-10-11 MED ORDER — NEOSTIGMINE METHYLSULFATE 1 MG/ML IJ SOLN
INTRAMUSCULAR | Status: DC | PRN
  Administered 2013-10-11: 2 mg via INTRAVENOUS

## 2013-10-11 MED ORDER — METHOCARBAMOL 500 MG PO TABS
500.0000 mg | ORAL_TABLET | Freq: Four times a day (QID) | ORAL | Status: DC | PRN
  Administered 2013-10-14: 500 mg via ORAL
  Filled 2013-10-11: qty 1

## 2013-10-11 MED ORDER — PHENYLEPHRINE HCL 10 MG/ML IJ SOLN
10.0000 mg | INTRAVENOUS | Status: DC | PRN
  Administered 2013-10-11: 10 ug/min via INTRAVENOUS

## 2013-10-11 MED ORDER — MIDAZOLAM HCL 2 MG/2ML IJ SOLN
INTRAMUSCULAR | Status: AC
  Filled 2013-10-11: qty 2

## 2013-10-11 MED ORDER — DEXTROSE-NACL 5-0.45 % IV SOLN: INTRAVENOUS | Status: DC

## 2013-10-11 MED ORDER — DOCUSATE SODIUM 100 MG PO CAPS
100.0000 mg | ORAL_CAPSULE | Freq: Two times a day (BID) | ORAL | Status: DC
  Administered 2013-10-11 – 2013-10-15 (×8): 100 mg via ORAL
  Filled 2013-10-11 (×9): qty 1

## 2013-10-11 MED ORDER — OXYCODONE HCL 5 MG/5ML PO SOLN: 5.0000 mg | Freq: Once | ORAL | Status: DC | PRN

## 2013-10-11 MED ORDER — SENNOSIDES-DOCUSATE SODIUM 8.6-50 MG PO TABS: 1.0000 | ORAL_TABLET | Freq: Every evening | ORAL | Status: DC | PRN

## 2013-10-11 MED ORDER — PHENOL 1.4 % MT LIQD: 1.0000 | OROMUCOSAL | Status: DC | PRN

## 2013-10-11 MED ORDER — KCL IN DEXTROSE-NACL 20-5-0.45 MEQ/L-%-% IV SOLN
INTRAVENOUS | Status: DC
  Administered 2013-10-11 – 2013-10-12 (×2): via INTRAVENOUS
  Filled 2013-10-11 (×15): qty 1000

## 2013-10-11 MED ORDER — FENTANYL CITRATE 0.05 MG/ML IJ SOLN
INTRAMUSCULAR | Status: AC
  Filled 2013-10-11: qty 5

## 2013-10-11 MED ORDER — ROCURONIUM BROMIDE 100 MG/10ML IV SOLN
INTRAVENOUS | Status: DC | PRN
  Administered 2013-10-11: 35 mg via INTRAVENOUS

## 2013-10-11 MED ORDER — HYDROMORPHONE HCL PF 1 MG/ML IJ SOLN: 1.0000 mg | INTRAMUSCULAR | Status: DC | PRN

## 2013-10-11 MED ORDER — METOPROLOL TARTRATE 25 MG PO TABS
25.0000 mg | ORAL_TABLET | Freq: Once | ORAL | Status: AC
  Administered 2013-10-11: 25 mg via ORAL

## 2013-10-11 MED ORDER — PANTOPRAZOLE SODIUM 40 MG PO TBEC
40.0000 mg | DELAYED_RELEASE_TABLET | Freq: Every day | ORAL | Status: DC
  Administered 2013-10-12 – 2013-10-15 (×4): 40 mg via ORAL
  Filled 2013-10-11 (×4): qty 1

## 2013-10-11 MED ORDER — FERROUS SULFATE 325 (65 FE) MG PO TABS
325.0000 mg | ORAL_TABLET | Freq: Two times a day (BID) | ORAL | Status: DC
  Administered 2013-10-12 – 2013-10-15 (×7): 325 mg via ORAL
  Filled 2013-10-11 (×9): qty 1

## 2013-10-11 MED ORDER — METHOCARBAMOL 100 MG/ML IJ SOLN
500.0000 mg | Freq: Four times a day (QID) | INTRAVENOUS | Status: DC | PRN
  Filled 2013-10-11: qty 5

## 2013-10-11 MED ORDER — ROCURONIUM BROMIDE 50 MG/5ML IV SOLN
INTRAVENOUS | Status: AC
  Filled 2013-10-11: qty 1

## 2013-10-11 MED ORDER — HYDROMORPHONE HCL PF 1 MG/ML IJ SOLN
INTRAMUSCULAR | Status: AC
  Filled 2013-10-11: qty 1

## 2013-10-11 MED ORDER — ARTIFICIAL TEARS OP OINT
TOPICAL_OINTMENT | OPHTHALMIC | Status: DC | PRN
  Administered 2013-10-11: 1 via OPHTHALMIC

## 2013-10-11 MED ORDER — NEOSTIGMINE METHYLSULFATE 1 MG/ML IJ SOLN
INTRAMUSCULAR | Status: AC
  Filled 2013-10-11: qty 10

## 2013-10-11 SURGICAL SUPPLY — 52 items
BLADE SAW SGTL 18X1.27X75 (BLADE) ×2 IMPLANT
BLADE SAW SGTL 18X1.27X75MM (BLADE) ×1
BRUSH FEMORAL CANAL (MISCELLANEOUS) IMPLANT
CAPT HIP PF MOP ×2 IMPLANT
CLOTH BEACON ORANGE TIMEOUT ST (SAFETY) ×3 IMPLANT
COVER BACK TABLE 24X17X13 BIG (DRAPES) IMPLANT
COVER SURGICAL LIGHT HANDLE (MISCELLANEOUS) ×6 IMPLANT
DRAPE ORTHO SPLIT 77X108 STRL (DRAPES) ×3
DRAPE PROXIMA HALF (DRAPES) ×3 IMPLANT
DRAPE SURG ORHT 6 SPLT 77X108 (DRAPES) ×1 IMPLANT
DRAPE U-SHAPE 47X51 STRL (DRAPES) ×3 IMPLANT
DRILL BIT 7/64X5 (BIT) ×3 IMPLANT
DRSG AQUACEL AG ADV 3.5X10 (GAUZE/BANDAGES/DRESSINGS) ×5 IMPLANT
DURAPREP 26ML APPLICATOR (WOUND CARE) ×3 IMPLANT
ELECT BLADE 4.0 EZ CLEAN MEGAD (MISCELLANEOUS) ×3
ELECT REM PT RETURN 9FT ADLT (ELECTROSURGICAL) ×3
ELECTRODE BLDE 4.0 EZ CLN MEGD (MISCELLANEOUS) ×1 IMPLANT
ELECTRODE REM PT RTRN 9FT ADLT (ELECTROSURGICAL) ×1 IMPLANT
GAUZE XEROFORM 1X8 LF (GAUZE/BANDAGES/DRESSINGS) ×1 IMPLANT
GLOVE BIO SURGEON STRL SZ7.5 (GLOVE) ×3 IMPLANT
GLOVE BIO SURGEON STRL SZ8.5 (GLOVE) ×6 IMPLANT
GLOVE BIOGEL PI IND STRL 8 (GLOVE) ×2 IMPLANT
GLOVE BIOGEL PI IND STRL 9 (GLOVE) ×1 IMPLANT
GLOVE BIOGEL PI INDICATOR 8 (GLOVE) ×4
GLOVE BIOGEL PI INDICATOR 9 (GLOVE) ×2
GOWN PREVENTION PLUS XLARGE (GOWN DISPOSABLE) ×3 IMPLANT
GOWN STRL NON-REIN LRG LVL3 (GOWN DISPOSABLE) ×6 IMPLANT
GOWN STRL REIN XL XLG (GOWN DISPOSABLE) ×6 IMPLANT
HANDPIECE INTERPULSE COAX TIP (DISPOSABLE)
HOOD PEEL AWAY FACE SHEILD DIS (HOOD) ×6 IMPLANT
KIT BASIN OR (CUSTOM PROCEDURE TRAY) ×3 IMPLANT
KIT ROOM TURNOVER OR (KITS) ×3 IMPLANT
MANIFOLD NEPTUNE II (INSTRUMENTS) ×3 IMPLANT
NEEDLE 22X1 1/2 (OR ONLY) (NEEDLE) ×3 IMPLANT
NS IRRIG 1000ML POUR BTL (IV SOLUTION) ×3 IMPLANT
PACK TOTAL JOINT (CUSTOM PROCEDURE TRAY) ×3 IMPLANT
PAD ARMBOARD 7.5X6 YLW CONV (MISCELLANEOUS) ×6 IMPLANT
PASSER SUT SWANSON 36MM LOOP (INSTRUMENTS) ×3 IMPLANT
PRESSURIZER FEMORAL UNIV (MISCELLANEOUS) IMPLANT
SET HNDPC FAN SPRY TIP SCT (DISPOSABLE) IMPLANT
SUT ETHIBOND 2 V 37 (SUTURE) ×3 IMPLANT
SUT ETHILON 3 0 FSL (SUTURE) ×3 IMPLANT
SUT VIC AB 0 CTB1 27 (SUTURE) ×3 IMPLANT
SUT VIC AB 1 CTX 36 (SUTURE) ×3
SUT VIC AB 1 CTX36XBRD ANBCTR (SUTURE) ×1 IMPLANT
SUT VIC AB 2-0 CTB1 (SUTURE) ×3 IMPLANT
SYR CONTROL 10ML LL (SYRINGE) ×3 IMPLANT
TOWEL OR 17X24 6PK STRL BLUE (TOWEL DISPOSABLE) ×3 IMPLANT
TOWEL OR 17X26 10 PK STRL BLUE (TOWEL DISPOSABLE) ×3 IMPLANT
TOWER CARTRIDGE SMART MIX (DISPOSABLE) IMPLANT
TRAY FOLEY CATH 14FR (SET/KITS/TRAYS/PACK) ×2 IMPLANT
WATER STERILE IRR 1000ML POUR (IV SOLUTION) ×12 IMPLANT

## 2013-10-11 NOTE — Progress Notes (Signed)
Pt was started on Cipro 250 mg BID x 3 days for a pre -op UTI. We are awaiting urine cultures.

## 2013-10-11 NOTE — Interval H&P Note (Signed)
History and Physical Interval Note:  10/11/2013 12:18 PM  Victoria HelperBetty C Glasheen  has presented today for surgery, with the diagnosis of OSTEOARTHRITIS LEFT HIP  The various methods of treatment have been discussed with the patient and family. After consideration of risks, benefits and other options for treatment, the patient has consented to  Procedure(s): TOTAL HIP ARTHROPLASTY (Right) as a surgical intervention .  The patient's history has been reviewed, patient examined, no change in status, stable for surgery.  I have reviewed the patient's chart and labs.  Questions were answered to the patient's satisfaction.     Nestor LewandowskyOWAN,Alexsia Klindt J

## 2013-10-11 NOTE — Transfer of Care (Signed)
Immediate Anesthesia Transfer of Care Note  Patient: Victoria Lewis  Procedure(s) Performed: Procedure(s): TOTAL HIP ARTHROPLASTY (Right)  Patient Location: PACU  Anesthesia Type:General  Level of Consciousness: awake and alert   Airway & Oxygen Therapy: Patient Spontanous Breathing and Patient connected to nasal cannula oxygen  Post-op Assessment: Report given to PACU RN and Post -op Vital signs reviewed and stable  Post vital signs: Reviewed and stable  Complications: No apparent anesthesia complications

## 2013-10-11 NOTE — Anesthesia Preprocedure Evaluation (Signed)
Anesthesia Evaluation  Patient identified by MRN, date of birth, ID band Patient awake    Reviewed: Allergy & Precautions, H&P , NPO status , Patient's Chart, lab work & pertinent test results  History of Anesthesia Complications Negative for: history of anesthetic complications  Airway Mallampati: I      Dental  (+) Teeth Intact and Dental Advisory Given   Pulmonary  breath sounds clear to auscultation        Cardiovascular hypertension, Rhythm:Regular Rate:Tachycardia     Neuro/Psych negative neurological ROS     GI/Hepatic hiatal hernia, GERD-  ,  Endo/Other    Renal/GU      Musculoskeletal   Abdominal   Peds  Hematology  (+) anemia ,   Anesthesia Other Findings   Reproductive/Obstetrics                           Anesthesia Physical Anesthesia Plan  ASA: III  Anesthesia Plan: General   Post-op Pain Management:    Induction: Intravenous  Airway Management Planned: Oral ETT  Additional Equipment:   Intra-op Plan:   Post-operative Plan: Extubation in OR  Informed Consent: I have reviewed the patients History and Physical, chart, labs and discussed the procedure including the risks, benefits and alternatives for the proposed anesthesia with the patient or authorized representative who has indicated his/her understanding and acceptance.   Dental advisory given  Plan Discussed with: Surgeon  Anesthesia Plan Comments: (Discussed anemia and likelihood of need for transfusion, patient and daughter agree)        Anesthesia Quick Evaluation

## 2013-10-11 NOTE — Progress Notes (Signed)
Dr. Jacklynn BueMassagee in to see pt. Request Type and Cross for 2 units in OR if needed. Also ordered pt. Receive Metoprolol 25 mg instead of 50 mg. 25 given.

## 2013-10-11 NOTE — Op Note (Addendum)
OPERATIVE REPORT    DATE OF PROCEDURE:  10/11/2013       PREOPERATIVE DIAGNOSIS:  OSTEOARTHRITIS RightHIP                                                          POSTOPERATIVE DIAGNOSIS:  OSTEOARTHRITIS Right HIP                                                           PROCEDURE:  Righttotal hip arthroplasty using a 52 mm DePuy Pinnacle  Cup, Peabody Energypex Hole Eliminator, 10-degree polyethylene liner index superior  and posterior, a +0 36 mm ceramic head, a [id number] SROM stem, 18Ds Sleeve   SURGEON: Aishani Kalis J    ASSISTANT:   Eric K. Gaylene BrooksPhillips PA-C  (present throughout entire procedure and necessary for timely completion of the procedure)   ANESTHESIA: General BLOOD LOSS: 300 FLUID REPLACEMENT: 1800 crystalloid DRAINS: Foley Catheter URINE OUTPUT: 300cc COMPLICATIONS: none    INDICATIONS FOR PROCEDURE: A 80 y.o. year-old With  OSTEOARTHRITISRight HIP   for 2 years, x-rays show bone-on-bone arthritic changes. Despite conservative measures with observation, anti-inflammatory medicine, narcotics, use of a cane, has severe unremitting pain and can ambulate only a few blocks before resting.  Patient desires elective Righttotal hip arthroplasty to decrease pain and increase function. The risks, benefits, and alternatives were discussed at length including but not limited to the risks of infection, bleeding, nerve injury, stiffness, blood clots, the need for revision surgery, cardiopulmonary complications, among others, and they were willing to proceed. Questions answered     PROCEDURE IN DETAIL: The patient was identified by armband,  received preoperative [facility], taken to the operating room , appropriate anesthetic monitors  were attached and general endotracheal anesthesia induced. Foley catheter was inserted. Pt was rolled into the L lateral decubitus position and fixed there with a Stulberg Mark II pelvic clamp.  The R lower extremity  was then prepped and draped  in the usual sterile fashion from the ankle to the hemipelvis. A time-out  procedure was performed. The skin along the lateral hip and thigh  infiltrated with 10 mL of 0.5% Marcaine and epinephrine solution. We  then made a posterolateral approach to the hip. With a #10 blade, a 18 cm  incision was made through the skin and subcutaneous tissue down to the level of the  IT band. Small bleeders were identified and cauterized. The IT band was cut in  line with skin incision exposing the greater trochanter. A Cobra retractor was placed between the gluteus minimus and the superior hip joint capsule, and a spiked Cobra between the quadratus femoris and the inferior hip joint capsule. This isolated the short  external rotators and piriformis tendons. These were tagged with a #2 Ethibond  suture and cut off their insertion on the intertrochanteric crest. The posterior  capsule was then developed into an acetabular-based flap from Posterior Superior off of the acetabulum out over the femoral neck and back posterior inferior to the acetabular rim. This flap was tagged with two #2 Ethibond sutures and retracted protecting  the sciatic nerve. This exposed the arthritic femoral head and osteophytes. The hip was then flexed and internally rotated, dislocating the femoral head and a standard neck cut performed 1 fingerbreadth above the lesser trochanter.  A spiked Cobra was placed in the cotyloid notch and a Hohmann retractor was then used to lever the femur anteriorly off of the anterior pelvic column. A posterior-inferior wing retractor was placed at the junction of the acetabulum and the ischium completing the acetabular exposure.We then removed the peripheral osteophytes and labrum from the acetabulum. Starting with a 45 mm reamer we then reamed the acetabulum up to 51 mm with basket reamers obtaining good coverage in all quadrants. We then irrigated with normal  saline solution and  hammered into place a 52 mm pinnacle cup in 45  degrees of abduction and about 20 degrees of anteversion. More  peripheral osteophytes removed and a trial 10-degree liner placed with the  index superior-posterior. The hip was then flexed and internally rotated exposing the  proximal femur, which was entered with the initiating reamer followed by  the axial reamers up to a 13.5 mm full depth and 14mm partial depth. We then conically reamed to 18D to the correct depth for a 42 base neck. The calcar was milled to 18Ds. A trial cone and stem was inserted in the 25 degrees anteversion, with a +0 36mm trial head. Trial reduction was then performed and excellent stability was noted with at 90 of flexion with 75 of internal rotation and then full extension with maximal external rotation. The hip could not be dislocated in full extension. The knee could easily flex  to about 130 degrees. We also stretched the abductors at this point,  because of the preexisting adductor contractures. All trial components  were then removed. The acetabulum was irrigated out with normal saline  solution. A titanium [facility] was then screwed into place  followed by a 10-degree polyethylene liner index superior-posterior. On  the femoral side a 18Ds ZTT1 sleeve was hammered into place, followed by a [phone] SROM stem in 25 degrees of anteversion. At this point, a +0 36 mm ceramic head was  hammered on the stem. The hip was reduced. We checked our stability  one more time and found it to be excellent. The wound was once again  thoroughly irrigated out with normal saline solution pulse lavage. The  capsular flap and short external rotators were repaired back to the  intertrochanteric crest through drill holes with a #2 Ethibond suture.  The IT band was closed with running 1 Vicryl suture. The subcutaneous  tissue with 0 and 2-0 undyed Vicryl suture and the skin with running  interlocking 3-0 nylon suture.  Dressing of Xeroform and Mepilex was  then applied. The patient was then unclamped, rolled supine, awaken extubated and taken to recovery room without difficulty in stable condition.   Vlad Mayberry J 10/11/2013, 1:50 PM

## 2013-10-11 NOTE — Preoperative (Signed)
Beta Blockers   Reason not to administer Beta Blockers:Not Applicable 

## 2013-10-11 NOTE — Anesthesia Procedure Notes (Addendum)
Procedure Name: Intubation Date/Time: 10/11/2013 12:37 PM Performed by: Gayla MedicusHYPES, Dara Camargo M. Pre-anesthesia Checklist: Patient identified, Patient being monitored, Emergency Drugs available, Timeout performed and Suction available Patient Re-evaluated:Patient Re-evaluated prior to inductionOxygen Delivery Method: Circle system utilized and Simple face mask Preoxygenation: Pre-oxygenation with 100% oxygen Intubation Type: IV induction Ventilation: Mask ventilation without difficulty Laryngoscope Size: Mac and 3 Grade View: Grade I Tube type: Oral Tube size: 7.0 mm Number of attempts: 1 Airway Equipment and Method: Stylet Placement Confirmation: ETT inserted through vocal cords under direct vision,  positive ETCO2,  CO2 detector and breath sounds checked- equal and bilateral Secured at: 21 cm Tube secured with: Tape Dental Injury: Teeth and Oropharynx as per pre-operative assessment

## 2013-10-12 ENCOUNTER — Encounter (HOSPITAL_COMMUNITY): Payer: Self-pay | Admitting: Orthopedic Surgery

## 2013-10-12 LAB — URINALYSIS, ROUTINE W REFLEX MICROSCOPIC
Bilirubin Urine: NEGATIVE
Glucose, UA: NEGATIVE mg/dL
Ketones, ur: NEGATIVE mg/dL
Nitrite: NEGATIVE
Protein, ur: NEGATIVE mg/dL
Specific Gravity, Urine: 1.01 (ref 1.005–1.030)
UROBILINOGEN UA: 0.2 mg/dL (ref 0.0–1.0)
pH: 6 (ref 5.0–8.0)

## 2013-10-12 LAB — BASIC METABOLIC PANEL
BUN: 12 mg/dL (ref 6–23)
CO2: 22 mEq/L (ref 19–32)
Calcium: 8.6 mg/dL (ref 8.4–10.5)
Chloride: 97 mEq/L (ref 96–112)
Creatinine, Ser: 1.11 mg/dL — ABNORMAL HIGH (ref 0.50–1.10)
GFR calc Af Amer: 52 mL/min — ABNORMAL LOW (ref >90)
GFR, EST NON AFRICAN AMERICAN: 45 mL/min — AB (ref >90)
Glucose, Bld: 114 mg/dL — ABNORMAL HIGH (ref 70–99)
POTASSIUM: 4.4 meq/L (ref 3.7–5.3)
SODIUM: 134 meq/L — AB (ref 137–147)

## 2013-10-12 LAB — CBC
HCT: 25.8 % — ABNORMAL LOW (ref 36.0–46.0)
HEMOGLOBIN: 8.4 g/dL — AB (ref 12.0–15.0)
MCH: 26.5 pg (ref 26.0–34.0)
MCHC: 32.6 g/dL (ref 30.0–36.0)
MCV: 81.4 fL (ref 78.0–100.0)
Platelets: 275 10*3/uL (ref 150–400)
RBC: 3.17 MIL/uL — ABNORMAL LOW (ref 3.87–5.11)
RDW: 17 % — ABNORMAL HIGH (ref 11.5–15.5)
WBC: 8.2 10*3/uL (ref 4.0–10.5)

## 2013-10-12 LAB — URINE MICROSCOPIC-ADD ON

## 2013-10-12 MED ORDER — HYDROCODONE-ACETAMINOPHEN 5-325 MG PO TABS
1.0000 | ORAL_TABLET | ORAL | Status: DC | PRN
  Administered 2013-10-13 – 2013-10-15 (×9): 1 via ORAL
  Filled 2013-10-12 (×10): qty 1

## 2013-10-12 NOTE — Progress Notes (Addendum)
Clinical Social Work Department CLINICAL SOCIAL WORK PLACEMENT NOTE 10/12/2013  Patient:  Tera HelperLINEBERRY,Keyondra C  Account Number:  [id number] Admit date:  10/11/2013  Clinical Social Worker:  Sharol HarnessPOONUM Amariss Detamore, Theresia MajorsLCSWA  Date/time:  10/12/2013 03:30 PM  Clinical Social Work is seeking post-discharge placement for this patient at the following level of care:   SKILLED NURSING   (*CSW will update this form in Epic as items are completed)   10/12/2013  Patient/family provided with [facility] Department of Clinical Social Work's list of facilities offering this level of care within the geographic area requested by the patient (or if unable, by the patient's family).  10/12/2013  Patient/family informed of their freedom to choose among providers that offer the needed level of care, that participate in Medicare, Medicaid or managed care program needed by the patient, have an available bed and are willing to accept the patient.  10/12/2013  Patient/family informed of [facility]' ownership interest in [facility], as well as of the fact that they are under no obligation to receive care at this facility.  PASARR submitted to EDS on 10/12/2013 PASARR number received from EDS on 10/12/2013  FL2 transmitted to all facilities in geographic area requested by pt/family on  10/12/2013 FL2 transmitted to all facilities within larger geographic area on   Patient informed that his/her managed care company has contracts with or will negotiate with  certain facilities, including the following:     Patient/family informed of bed offers received:  10/14/2013 [facility] Physician recommends and patient chooses bed at    [facility] on  10/15/2013 [facility]  The following physician request were entered in Epic:   Additional Comments:   Louay Myrie, LCSWA [phone]

## 2013-10-12 NOTE — Progress Notes (Signed)
Utilization review completed.  

## 2013-10-12 NOTE — Evaluation (Signed)
Physical Therapy Evaluation Patient Details Name: Victoria Lewis MRN: [id number] DOB: 1928/06/21 Today's Date: 10/12/2013 Time: 4098-1191 PT Time Calculation (min): 31 min  PT Assessment / Plan / Recommendation History of Present Illness  pt presents with R THA and long hx of decreased mobility per daughter.    Clinical Impression  Pt requires extensive A for all mobility and at times resists mobility 2/2 fearful of falling.  Per daughter plan is for pt to go to SNF prior to returning to home as pt has become a burden on pt's husband, who recently injured himself trying to A pt.  Will continue to follow.      PT Assessment  Patient needs continued PT services    Follow Up Recommendations  SNF    Does the patient have the potential to tolerate intense rehabilitation      Barriers to Discharge        Equipment Recommendations  None recommended by PT    Recommendations for Other Services OT consult   Frequency 7X/week    Precautions / Restrictions Precautions Precautions: Posterior Hip;Fall Precaution Booklet Issued: Yes (comment) Precaution Comments: Reviewed with pt and daughter, however pt with Dementia.   Restrictions Weight Bearing Restrictions: Yes RLE Weight Bearing: Weight bearing as tolerated   Pertinent Vitals/Pain Did not rate, but calls out during mobility.  Per daughter pt has had pain meds.        Mobility  Bed Mobility Overal bed mobility: Needs Assistance Bed Mobility: Supine to Sit Supine to sit: Max assist;HOB elevated General bed mobility comments: cues for sequencing and hip precautions.  pt at times resistant due to pain in R hip.   Transfers Overall transfer level: Needs assistance Equipment used: 1 person hand held assist Transfers: Sit to/from [employer] Sit to Stand: Max assist Stand pivot transfers: Max assist General transfer comment: max step-by-step cueing for safe technique and attending to task.  At times pt  resistant to mobility and tries to lean away from PT.  Daughter in room to A with encouragement andgetting pt to let go of bed rail in order to complete pivot.      Exercises Total Joint Exercises Ankle Circles/Pumps: AROM;Both;10 reps Quad Sets: AROM;Both;10 reps   PT Diagnosis: Abnormality of gait;Acute pain  PT Problem List: Decreased strength;Decreased activity tolerance;Decreased balance;Decreased mobility;Decreased cognition;Decreased knowledge of use of DME;Decreased knowledge of precautions;Decreased coordination;Pain PT Treatment Interventions: DME instruction;Gait training;Functional mobility training;Therapeutic activities;Therapeutic exercise;Balance training;Cognitive remediation;Patient/family education     PT Goals(Current goals can be found in the care plan section) Acute Rehab PT Goals Patient Stated Goal: Per daughter for pt to go to rehab prior to returning to home.   PT Goal Formulation: With family Time For Goal Achievement: 10/26/13 Potential to Achieve Goals: Fair  Visit Information  Last PT Received On: 10/12/13 Assistance Needed: +2 History of Present Illness: pt presents with R THA and long hx of decreased mobility per daughter.         Prior Functioning  Home Living Family/patient expects to be discharged to:: Skilled nursing facility Prior Function Level of Independence: Needs assistance Gait / Transfers Assistance Needed: A with coming to stand and during ambulation.   ADL's / Homemaking Assistance Needed: A with all ADLs and family performs homemaking.   Communication Communication: HOH    Cognition  Cognition Arousal/Alertness: Awake/alert Behavior During Therapy: WFL for tasks assessed/performed Overall Cognitive Status: History of cognitive impairments - at baseline    Extremity/Trunk Assessment Upper Extremity Assessment  Upper Extremity Assessment: Defer to OT evaluation Lower Extremity Assessment Lower Extremity Assessment: RLE  deficits/detail;LLE deficits/detail RLE: Unable to fully assess due to pain RLE Coordination: decreased fine motor;decreased gross motor LLE Deficits / Details: Generalized weakness LLE Coordination: decreased fine motor;decreased gross motor   Balance Balance Overall balance assessment: Needs assistance Sitting-balance support: Bilateral upper extremity supported;Feet supported Sitting balance-Leahy Scale: Fair Standing balance support: Bilateral upper extremity supported Standing balance-Leahy Scale: Zero  End of Session PT - End of Session Equipment Utilized During Treatment: Gait belt Activity Tolerance: Patient limited by fatigue;Patient limited by pain Patient left: in chair;with call bell/phone within reach;with family/visitor present Nurse Communication: Mobility status  GP     Sunny SchleinRitenour, Shaley Leavens F, [place]PT [phone] 10/12/2013, 12:16 PM

## 2013-10-12 NOTE — Care Management Note (Signed)
CARE MANAGEMENT NOTE 10/12/2013  Patient:  Tera HelperLINEBERRY,Tamilyn C   Account Number:  [id number]  Date Initiated:  10/12/2013  Documentation initiated by:  Vance PeperBRADY,Marlen Mollica  Subjective/Objective Assessment:   80 yr old female s/p right total hip arthroplasty.     Action/Plan:   patient [facility]. Family wants [facility]. Social Worker is aware.   Anticipated DC Date:  10/13/2013   Anticipated DC Plan:  SKILLED NURSING FACILITY  In-house referral  Clinical Social Worker      DC Planning Services  CM consult      Choice offered to / List presented to:             Status of service:  Completed, signed off Medicare Important Message given?   (If response is "NO", the following Medicare IM given date fields will be blank) Date Medicare IM given:   Date Additional Medicare IM given:    Discharge Disposition:  SKILLED NURSING FACILITY

## 2013-10-12 NOTE — Anesthesia Postprocedure Evaluation (Signed)
  Anesthesia Post-op Note  Patient: Victoria Lewis  Procedure(s) Performed: Procedure(s): TOTAL HIP ARTHROPLASTY (Right)  Patient Location: PACU  Anesthesia Type:General  Level of Consciousness: awake, alert , oriented and patient cooperative  Airway and Oxygen Therapy: Patient Spontanous Breathing  Post-op Pain: moderate  Post-op Assessment: Post-op Vital signs reviewed, Patient's Cardiovascular Status Stable, Respiratory Function Stable, Patent Airway, No signs of Nausea or vomiting and Pain level controlled  Post-op Vital Signs: stable  Complications: No apparent anesthesia complications

## 2013-10-12 NOTE — Progress Notes (Signed)
Clinical Social Work Department BRIEF PSYCHOSOCIAL ASSESSMENT 10/12/2013  Patient:  Victoria Lewis,Victoria Lewis     Account Number:  [id number]     Admit date:  10/11/2013  Clinical Social Worker:  Harless NakayamaAMBELAL,Carlen Fils, LCSWA  Date/Time:  10/12/2013 03:00 PM  Referred by:  Physician  Date Referred:  10/12/2013 Referred for  SNF Placement   Other Referral:   Interview type:  Patient Other interview type:   Spoke with pt, pt husband, and pt son at beside    PSYCHOSOCIAL DATA Living Status:  HUSBAND Admitted from facility:   Level of care:   Primary support name:  Victoria MaudlinScott Lewis [phone] Primary support relationship to patient:  SPOUSE Degree of support available:   Pt has very good support system    CURRENT CONCERNS Current Concerns  Post-Acute Placement   Other Concerns:    SOCIAL WORK ASSESSMENT / PLAN CSW aware of recommendation from PT. CSW visited pt room to discuss recommendation. Pt participated very little in conversation as her husband answered for her. They did however inform CSW at end of conversation that pt is hard of hearing and that is why she did not participate in conversation more. Pt husband and son informed CSW they were already made aware of recommendation for ST rehab and are agreeable. Pt husband informed CSW his daughter is familiar with facilities and they would like [facility]. CSW explained SNF referral process, but at this time pt family is only wanting for pt to be refered out to [facility]. CSW to update pt and pt family tomorrow.   Assessment/plan status:  Psychosocial Support/Ongoing Assessment of Needs Other assessment/ plan:   Information/referral to community resources:   SNF list denied at this time but will provided should preferred facility not be available.    PATIENT'S/FAMILY'S RESPONSE TO PLAN OF CARE: Pt and pt family are agreeble to SNF.       Victoria Lewis, LCSWA [phone]

## 2013-10-12 NOTE — Progress Notes (Signed)
Patient ID: Victoria Lewis, female   DOB: [birth year], 80 y.o.   MRN: [id number] PATIENT ID: Victoria Lewis  MRN: [id number]  DOB/AGE:  [birth year] / 80 y.o.  1 Day Post-Op Procedure(s) (LRB): TOTAL HIP ARTHROPLASTY (Right)    PROGRESS NOTE Subjective: Patient is alert, oriented,no Nausea, 1x Vomiting, yes passing gas, no Bowel Movement. Taking PO well. Denies SOB, Chest or Calf Pain. Using Incentive Spirometer, PAS in place. Ambulate WBAT today With physical therapy Patient reports pain as 5 on 0-10 scale  .    Objective: Vital signs in last 24 hours: Filed Vitals:   10/11/13 1743 10/11/13 1750 10/12/13 0131 10/12/13 0630  BP:  142/59 146/60 142/65  Pulse: 83 86 82 85  Temp: 97.1 F (36.2 C) 98.3 F (36.8 C) 98.7 F (37.1 C) 98.2 F (36.8 C)  TempSrc:      Resp: 12 16 18 18   SpO2: 97% 99% 99% 99%      Intake/Output from previous day: I/O last 3 completed shifts: In: 2300 [P.O.:100; I.V.:2200] Out: 1290 [Urine:990; Blood:300]   Intake/Output this shift:     LABORATORY DATA:  Recent Labs  10/12/13 0627  WBC 8.2  HGB 8.4*  HCT 25.8*  PLT 275  Urine culture taken at surgery is pending, urine obtained on Foley insertion looked purulent, patient has had previous UTIs, treated well with Cipro.  Examination: Neurologically intact ABD soft Neurovascular intact Sensation intact distally Intact pulses distally Dorsiflexion/Plantar flexion intact Incision: scant drainage No cellulitis present Compartment soft} XR AP&Lat of hip shows well placed\fixed THA  Assessment:   1 Day Post-Op Procedure(s) (LRB): TOTAL HIP ARTHROPLASTY (Right) ADDITIONAL DIAGNOSIS:  Hypertension and UTI Preop, Postop anemia with hemoglobin of 8.4, however, patient also had preoperative anemia, chronic, with a hemoglobin of 9.5 preoperatively.  Plan: PT/OT WBAT, THA  posterior precautions,We will treat the preoperative UTI with Cipro, which has worked well as an outpatient in the past, we  will monitor her hemoglobin and if it gets into the sevens.  We will transfuse  DVT Prophylaxis: SCDx72 hrs, ASA 325 mg BID x 2 weeks  DISCHARGE PLAN: Skilled Nursing Facility/Rehab, [place] [facility] [place]  DISCHARGE NEEDS: HHPT, HHRN, CPM, Walker and 3-in-1 comode seat

## 2013-10-13 ENCOUNTER — Encounter (HOSPITAL_COMMUNITY): Payer: Self-pay | Admitting: General Practice

## 2013-10-13 LAB — CBC
HCT: 24.7 % — ABNORMAL LOW (ref 36.0–46.0)
Hemoglobin: 8 g/dL — ABNORMAL LOW (ref 12.0–15.0)
MCH: 26.3 pg (ref 26.0–34.0)
MCHC: 32.4 g/dL (ref 30.0–36.0)
MCV: 81.3 fL (ref 78.0–100.0)
PLATELETS: 219 10*3/uL (ref 150–400)
RBC: 3.04 MIL/uL — ABNORMAL LOW (ref 3.87–5.11)
RDW: 17 % — AB (ref 11.5–15.5)
WBC: 9.9 10*3/uL (ref 4.0–10.5)

## 2013-10-13 LAB — URINE CULTURE: Colony Count: >=100000

## 2013-10-13 MED ORDER — HYDROCODONE-ACETAMINOPHEN 5-325 MG PO TABS: 1.0000 | ORAL_TABLET | ORAL | Status: DC | PRN

## 2013-10-13 MED ORDER — PNEUMOCOCCAL VAC POLYVALENT 25 MCG/0.5ML IJ INJ
0.5000 mL | INJECTION | INTRAMUSCULAR | Status: DC
  Filled 2013-10-13: qty 0.5

## 2013-10-13 MED ORDER — TIZANIDINE HCL 2 MG PO CAPS: 2.0000 mg | ORAL_CAPSULE | Freq: Three times a day (TID) | ORAL | Status: DC

## 2013-10-13 MED ORDER — ASPIRIN EC 325 MG PO TBEC: 325.0000 mg | DELAYED_RELEASE_TABLET | Freq: Two times a day (BID) | ORAL | Status: DC

## 2013-10-13 MED ORDER — CEFUROXIME AXETIL 500 MG PO TABS
500.0000 mg | ORAL_TABLET | Freq: Two times a day (BID) | ORAL | Status: DC
  Administered 2013-10-14 – 2013-10-15 (×3): 500 mg via ORAL
  Filled 2013-10-13 (×5): qty 1

## 2013-10-13 NOTE — Progress Notes (Signed)
Physical Therapy Treatment Patient Details Name: Victoria Lewis MRN: [id number] DOB: Aug 13, 1928 Today's Date: 10/13/2013 Time: 4098-1191 PT Time Calculation (min): 31 min  PT Assessment / Plan / Recommendation  History of Present Illness Pt presents with R THA and long hx of decreased mobility per daughter.     PT Comments   Pt made progress today with PT, tolerated therapy better without screaming or resisting. Pt still requires +2 assist for sit to stand and pivot transfers and ambulation not yet appropriate. Family agreeable to SNF for rehab. PT will continue to follow.   Follow Up Recommendations  SNF     Does the patient have the potential to tolerate intense rehabilitation     Barriers to Discharge        Equipment Recommendations  None recommended by PT    Recommendations for Other Services    Frequency 7X/week   Progress towards PT Goals Progress towards PT goals: Progressing toward goals  Plan Current plan remains appropriate    Precautions / Restrictions Precautions Precautions: Posterior Hip;Fall Precaution Comments: no carryover of precautions for pt Restrictions Weight Bearing Restrictions: Yes RLE Weight Bearing: Weight bearing as tolerated   Pertinent Vitals/Pain VSS    Mobility  Bed Mobility Overal bed mobility: Needs Assistance Bed Mobility: Rolling;Sidelying to Sit Rolling: Max assist;+2 for physical assistance Sidelying to sit: Max assist;+2 for physical assistance;HOB elevated General bed mobility comments: pt performed ~15% of bed mobility task, leaning back instead of fwd with SL to sit, manual facilitation given to get to EOB Transfers Overall transfer level: Needs assistance Equipment used: Rolling walker (2 wheeled) Transfers: Sit to/from [employer] Sit to Stand: +2 physical assistance;Total assist Stand pivot transfers: Total assist;+2 physical assistance General transfer comment: pt performed sit to stand transfer 3x  before SPT. Each time pt performed 10% of work. Leaning bkwd with trunk flexed, bilateral feet blocked, unable to achieve full upright posture. With SPT, pt required max facilitation at trunk from theapist in front to achieve standing, and then max facilitation of OT from behind to guide hips to chair as pt still in flexed position and very difficult to wt-shift. Ambulation/Gait General Gait Details: unable at this point. Performed pregait activities in standing: wt-shifting, but pt required max facilitation to do so.    Exercises Total Joint Exercises Ankle Circles/Pumps: AROM;Both;10 reps Quad Sets: AROM;Both;10 reps Heel Slides: AAROM;Both;10 reps;Supine Hip ABduction/ADduction: AAROM;Both;10 reps;Supine Straight Leg Raises: AAROM;Both;10 reps;Supine Long Arc Quad: AROM;Both;10 reps;Seated   PT Diagnosis:    PT Problem List:   PT Treatment Interventions:     PT Goals (current goals can now be found in the care plan section) Acute Rehab PT Goals Patient Stated Goal: Per daughter for pt to go to rehab prior to returning to home.   PT Goal Formulation: With family Time For Goal Achievement: 10/26/13 Potential to Achieve Goals: Fair  Visit Information  Last PT Received On: 10/13/13 Assistance Needed: +2 PT/OT/SLP Co-Evaluation/Treatment: Yes Reason for Co-Treatment: For patient/therapist safety;Necessary to address cognition/behavior during functional activity PT goals addressed during session: Mobility/safety with mobility;Balance;Proper use of DME;Strengthening/ROM OT goals addressed during session: ADL's and self-care;Proper use of Adaptive equipment and DME;Other (comment) (mobility/safety w/ mobility) History of Present Illness: Pt presents with R THA and long hx of decreased mobility per daughter.      Subjective Data  Subjective: pt frequently sticks tongue out or rolls eyes at therapist and daughter but cooperates physically Patient Stated Goal: Per daughter for pt to go  to  rehab prior to returning to home.     Cognition  Cognition Arousal/Alertness: Awake/alert Behavior During Therapy: WFL for tasks assessed/performed Overall Cognitive Status: History of cognitive impairments - at baseline Memory: Decreased recall of precautions;Decreased short-term memory    Balance  Balance Overall balance assessment: Needs assistance Sitting-balance support: Bilateral upper extremity supported;Feet supported Sitting balance-Leahy Scale: Fair Postural control: Posterior lean Standing balance support: Bilateral upper extremity supported;During functional activity Standing balance-Leahy Scale: Zero  End of Session PT - End of Session Equipment Utilized During Treatment: Gait belt Activity Tolerance: Patient limited by fatigue;Patient limited by pain Patient left: in chair;with call bell/phone within reach;with family/visitor present Nurse Communication: Mobility status   GP   Lyanne CoVictoria Netha Dafoe, PT  Acute Rehab Services  [phone]   Lyanne CoManess, Candid Bovey 10/13/2013, 10:54 AM

## 2013-10-13 NOTE — Progress Notes (Addendum)
PATIENT ID: Victoria HelperBetty C Lewis  MRN: [id number]  DOB/AGE:  06-01-1928 / 80 y.o.  2 Days Post-Op Procedure(s) (LRB): TOTAL HIP ARTHROPLASTY (Right)    PROGRESS NOTE Subjective: Patient is alert, oriented,no Nausea, no Vomiting, yes passing gas, no Bowel Movement. Taking PO well. Denies SOB, Chest or Calf Pain. Using Incentive Spirometer, PAS in place. Ambulate WBAT Patient reports pain as moderate.  Pt's family would like to stop pt's stronger pain meds as they believe this is affecting her ability to get up with therapy.    Objective: Vital signs in last 24 hours: Filed Vitals:   10/12/13 0131 10/12/13 0630 10/12/13 1400 10/12/13 2017  BP: 146/60 142/65 143/59 168/83  Pulse: 82 85 81 91  Temp: 98.7 F (37.1 C) 98.2 F (36.8 C) 98.3 F (36.8 C) 98.9 F (37.2 C)  TempSrc:    Oral  Resp: 18 18 16 18   SpO2: 99% 99% 97% 96%      Intake/Output from previous day: I/O last 3 completed shifts: In: 2940 [P.O.:540; I.V.:2400] Out: 800 [Urine:800]   Intake/Output this shift:     LABORATORY DATA:  Recent Labs  10/12/13 0627 10/13/13 0712  WBC 8.2 9.9  HGB 8.4* 8.0*  HCT 25.8* 24.7*  PLT 275 219  NA 134*  --   K 4.4  --   CL 97  --   CO2 22  --   BUN 12  --   CREATININE 1.11*  --   GLUCOSE 114*  --   CALCIUM 8.6  --     Examination: Neurologically intact ABD soft Neurovascular intact Sensation intact distally Intact pulses distally Dorsiflexion/Plantar flexion intact Incision: dressing C/D/I No cellulitis present Compartment soft} XR AP&Lat of hip shows well placed\fixed THA  Assessment:   2 Days Post-Op Procedure(s) (LRB): TOTAL HIP ARTHROPLASTY (Right) ADDITIONAL DIAGNOSIS:  Hypertension and UTI Preop, Postop anemia with hemoglobin of 8.0, however, patient also had preoperative anemia, chronic, with a hemoglobin of 9.5 preoperatively.   Plan: PT/OT WBAT, THA  posterior precautions  DVT Prophylaxis: SCDx72 hrs, ASA 325 mg BID x 2 weeks  DISCHARGE PLAN:  Skilled Nursing Facility/Rehab  DISCHARGE NEEDS: HHPT, HHRN, Walker and 3-in-1 comode seat  Pt's dilaudid and oxycodone discontinued, pt to use hydrocodone for pain control.

## 2013-10-13 NOTE — Evaluation (Signed)
Occupational Therapy Evaluation Patient Details Name: Victoria Lewis MRN: [id number] DOB: Jan 09, 1928 Today's Date: 10/13/2013 Time: 9562-1308 OT Time Calculation (min): 31 min  OT Assessment / Plan / Recommendation History of present illness Pt presents with R THA and long hx of decreased mobility per daughter.     Clinical Impression   Pt presents w/ dx as above & h/o of dementia impacting her ability to perform functional transfers related to ADL's & self care tasks. Pt will require SNF secondary to currently +2 total assist for sit to stand and SPT's, pt ~10%. Will follow acutely to assist w/ increasing functional mobility, ADL's & decreasing burden of care in preparation for d/c to next venue.    OT Assessment  Patient needs continued OT Services    Follow Up Recommendations  SNF    Barriers to Discharge      Equipment Recommendations  Other (comment) (Defer to next venue)    Recommendations for Other Services    Frequency  Min 2X/week    Precautions / Restrictions Precautions Precautions: Posterior Hip;Fall Restrictions Weight Bearing Restrictions: Yes RLE Weight Bearing: Weight bearing as tolerated   Pertinent Vitals/Pain Pt had pain medication prior to therapy session today.    ADL  Eating/Feeding: Performed;Set up Where Assessed - Eating/Feeding: Bed level Grooming: Performed;Minimal assistance Where Assessed - Grooming: Supported sitting Upper Body Bathing: Simulated;Minimal assistance Where Assessed - Upper Body Bathing: Supported sitting Lower Body Bathing: Simulated;Maximal assistance;+2 Total assistance Lower Body Bathing: Patient Percentage: 10% Where Assessed - Lower Body Bathing: Supported sit to stand Upper Body Dressing: Performed;Minimal assistance Where Assessed - Upper Body Dressing: Supported sitting;Unsupported sitting Lower Body Dressing: Simulated;+2 Total assistance Lower Body Dressing: Patient Percentage: 10% Where Assessed - Lower Body  Dressing: Supported sit to stand Toilet Transfer: Simulated;+2 Total assistance (Sit to stand from EOB x4, then SPT EOB to chair) Toilet Transfer: Patient Percentage: 10% Toilet Transfer Method: Surveyor, minerals: Other (comment) (Wears depends at home, family changes, pt "goes into bathroom" however per pt's daughter prior to this fx) Toileting - Architect and Hygiene: Performed Where Assessed - Engineer, mining and Hygiene: Standing Tub/Shower Transfer Method: Not assessed Equipment Used: Gait belt;Rolling walker Transfers/Ambulation Related to ADLs: Pt is +2 total assist sit to stand from EOB x4 using RW. Pt with strong posterior lean and forward flexed trunk in standing noted. SPT +2 total assist from EOB-chair w/o RW for safety. ADL Comments: Pt/daughter were educated in role of OT and recommendations for SNF after acute stay. Pt w/ dementia, daughter agreeable to SNF secondary to pt +2 total assist at this time. Performed sit to stand therapeutic activities x4 in preparation for increased independence/decreased burden of care and increased ADL participation. Dementia is an issue for carry over. Pt's daughter state that pt was assist with all ADL's at home prior to this hospitialization, however stated that she walked short distances at times given assistance.    OT Diagnosis: Generalized weakness;Acute pain  OT Problem List: Decreased strength;Decreased activity tolerance;Impaired balance (sitting and/or standing);Decreased knowledge of precautions;Decreased knowledge of use of DME or AE;Decreased safety awareness;Decreased cognition;Pain OT Treatment Interventions: Self-care/ADL training;DME and/or AE instruction;Patient/family education;Therapeutic activities;Balance training   OT Goals(Current goals can be found in the care plan section) Acute Rehab OT Goals Patient Stated Goal: Per daughter for pt to go to rehab prior to returning to home.    OT Goal Formulation: Patient unable to participate in goal setting Time For Goal Achievement: 10/27/13 Potential to  Achieve Goals: Fair  Visit Information  Last OT Received On: 10/13/13 Assistance Needed: +2 PT/OT/SLP Co-Evaluation/Treatment: Yes OT goals addressed during session: ADL's and self-care;Proper use of Adaptive equipment and DME;Other (comment) (mobility/safety w/ mobility) History of Present Illness: Pt presents with R THA and long hx of decreased mobility per daughter.         Prior Functioning     Home Living Family/patient expects to be discharged to:: Skilled nursing facility Living Arrangements: Spouse/significant other Prior Function Level of Independence: Needs assistance Gait / Transfers Assistance Needed: A with coming to stand and during ambulation.   ADL's / Homemaking Assistance Needed: A with all ADLs and family performs homemaking.   Communication Communication: HOH    Vision/Perception Vision - History Baseline Vision: Wears glasses all the time   Cognition  Cognition Arousal/Alertness: Awake/alert Behavior During Therapy: WFL for tasks assessed/performed Overall Cognitive Status: History of cognitive impairments - at baseline    Extremity/Trunk Assessment Upper Extremity Assessment Upper Extremity Assessment: Generalized weakness Lower Extremity Assessment Lower Extremity Assessment: Defer to PT evaluation    Mobility Bed Mobility Overal bed mobility: Needs Assistance Bed Mobility: Rolling;Sidelying to Sit Rolling: Max assist;+2 for physical assistance Sidelying to sit: Max assist;+2 for physical assistance;HOB elevated General bed mobility comments: Cues for sequencing, safety, hand placement and hip precautions.  pt resistant/hesistant to move.   Transfers Overall transfer level: Needs assistance Equipment used: Rolling walker (2 wheeled) Transfers: Sit to/from [employer];Stand Pivot Transfers Sit to Stand: +2 physical assistance;Total  assist Stand pivot transfers: Total assist;+2 physical assistance General transfer comment: max step-by-step cueing for safe technique and attending to task.  At times pt resistant to mobility, strong posterior lean and wide base of stance with forward/flexed trunk, making transfers difficult.  Daughter in room to A with encouragement, +2 total assist, pt ~10%.          Balance Balance Overall balance assessment: Needs assistance Sitting-balance support: Bilateral upper extremity supported;Feet supported Sitting balance-Leahy Scale: Fair Postural control: Posterior lean;Other (comment) (Foward flexed trunk in standing, wide base of stance) Standing balance support: Bilateral upper extremity supported Standing balance-Leahy Scale: Poor   End of Session OT - End of Session Equipment Utilized During Treatment: Gait belt;Rolling walker Activity Tolerance: Patient tolerated treatment well Patient left: in chair;with call bell/phone within reach;with family/visitor present Nurse Communication: Mobility status;Need for lift equipment;Patient requests pain meds  GO     Alm BustardBarnhill, Amy Beth Dixon 10/13/2013, 9:30 AM

## 2013-10-13 NOTE — Progress Notes (Signed)
When cleaning patient up after returning to bed, RN noticed Right hip incision site seemed to have increased redness, swelling, and warmth. RN marked area of redness/swelling. MD notified. Will continue to monitor.

## 2013-10-13 NOTE — Clinical Documentation Improvement (Signed)
THIS DOCUMENT IS NOT A PERMANENT PART OF THE MEDICAL RECORD  10/13/13  Dear Dannielle BurnEric Jaskirat Zertuche, PA-C,  In an effort to better capture your patient's severity of illness, reflect appropriate length of stay and utilization of resources, a review of the patient medical record has revealed the following indicators.  In your H&P from 10/07/13, you documented "This patient has had avascular necrosis of the hip, acetabular fracture, hip dysplasia."  Please exercise your independent judgment. The fact queries are asked, does not imply that any particular answer is desired or expected. Please clarify and document in a progress note and/or discharge summary the clinical condition associated with the following supporting information.      Was the acetabular fracture present on admission, or did it occur in the past and was treated medically?    Did the fracture occur on the right hip or the left hip prior to her previous hip surgery?   Conditions documented as possible, probable, or suspected can be coded if restated at the time of discharge.    Thank You,  Darla LeschesWendy Wright, RN, BSN, CCRN Clinical Documentation Improvement Specialist HIM department--[place] Office [phone]   Upon reviewing the patient's chart and [facility], the patient has a history of hip dysplasia and necrosis of the hip.  She does not have a history of acetabular fracture.

## 2013-10-14 LAB — CBC
HCT: 22.7 % — ABNORMAL LOW (ref 36.0–46.0)
Hemoglobin: 7.5 g/dL — ABNORMAL LOW (ref 12.0–15.0)
MCH: 27 pg (ref 26.0–34.0)
MCHC: 33 g/dL (ref 30.0–36.0)
MCV: 81.7 fL (ref 78.0–100.0)
Platelets: 271 10*3/uL (ref 150–400)
RBC: 2.78 MIL/uL — ABNORMAL LOW (ref 3.87–5.11)
RDW: 17.5 % — AB (ref 11.5–15.5)
WBC: 10.1 10*3/uL (ref 4.0–10.5)

## 2013-10-14 LAB — PREPARE RBC (CROSSMATCH)

## 2013-10-14 MED ORDER — CEFUROXIME AXETIL 500 MG PO TABS: 500.0000 mg | ORAL_TABLET | Freq: Two times a day (BID) | ORAL | Status: DC

## 2013-10-14 MED ORDER — POLYETHYLENE GLYCOL 3350 17 G PO PACK
17.0000 g | PACK | Freq: Every day | ORAL | Status: DC | PRN
  Administered 2013-10-14: 17 g via ORAL
  Filled 2013-10-14: qty 1

## 2013-10-14 NOTE — Progress Notes (Addendum)
Patient ID: Victoria Lewis, female   DOB: 07/07/1928, 80 y.o.   MRN: [id number] PATIENT ID: Victoria HelperBetty C Barua  MRN: [id number]  DOB/AGE:  78/29/1929 / 80 y.o.  3 Days Post-Op Procedure(s) (LRB): TOTAL HIP ARTHROPLASTY (Right)    PROGRESS NOTE Subjective: Patient is alert, oriented,no Nausea, no Vomiting, yes passing gas, no Bowel Movement. Taking PO well. Denies SOB, Chest or Calf Pain. Using Incentive Spirometer, PAS in place. Ambulate WBAT with physical therapy Patient reports pain as 6 on 0-10 scale, patient does feel fatigued when she attempts to get up with physical therapy  .    Objective: Vital signs in last 24 hours: Filed Vitals:   10/13/13 1600 10/13/13 1900 10/13/13 2000 10/14/13 0608  BP:  121/46  131/51  Pulse:  96  85  Temp:  98.9 F (37.2 C)  98.1 F (36.7 C)  TempSrc:  Oral  Oral  Resp: 18 18 18 18   SpO2:  98% 98% 98%      Intake/Output from previous day: I/O last 3 completed shifts: In: 1980 [P.O.:840; I.V.:1140] Out: -    Intake/Output this shift:     LABORATORY DATA:  Recent Labs  10/12/13 0627 10/13/13 0712 10/14/13 0557  WBC 8.2 9.9 10.1  HGB 8.4* 8.0* 7.5*  HCT 25.8* 24.7* 22.7*  PLT 275 219 271  NA 134*  --   --   K 4.4  --   --   CL 97  --   --   CO2 22  --   --   BUN 12  --   --   CREATININE 1.11*  --   --   GLUCOSE 114*  --   --   CALCIUM 8.6  --   --     Examination: Neurologically intact ABD soft Neurovascular intact Sensation intact distally Intact pulses distally Dorsiflexion/Plantar flexion intact Incision: no drainage No cellulitis present Compartment soft} XR AP&Lat of hip shows well placed\fixed THA  Assessment:   3 Days Post-Op Procedure(s) (LRB): TOTAL HIP ARTHROPLASTY (Right), at surgery the patient was noted to have a pre-existing rim fracture of the anterior wall of the acetabulum ADDITIONAL DIAGNOSIS:  Hypertension, postoperative anemia with symptoms  Plan: PT/OT WBAT, THA  posterior precautions, for  postoperative anemia will transfuse one unit of packed red cells  DVT Prophylaxis: SCDx72 hrs, ASA 325 mg BID x 2 weeks  DISCHARGE PLAN: Skilled Nursing Facility/Rehab, [facility] [place]. Should be ready for discharge tomorrow, she will be getting blood transfusion today for postoperative anemia DISCHARGE NEEDS: HHPT, HHRN, Walker and 3-in-1 comode seat

## 2013-10-14 NOTE — Plan of Care (Signed)
Problem: Consults Goal: Diagnosis- Total Joint Replacement Primary Total Hip     

## 2013-10-14 NOTE — Discharge Summary (Deleted)
Patient ID: Victoria Lewis MRN: [id number] DOB/AGE: 78-Sep-1929 78 y.o.  Admit date: 10/11/2013 Discharge date: 10/14/2013  Admission Diagnoses:  Principal Problem:   Right Hip arthritis   Discharge Diagnoses:  Same  Past Medical History  Diagnosis Date  . Hypertension   . GERD (gastroesophageal reflux disease)   . H/O hiatal hernia   . Arthritis   . Anemia     iron    Surgeries: Procedure(s): TOTAL HIP ARTHROPLASTY on 10/11/2013   Consultants:    Discharged Condition: Improved  Hospital Course: Victoria HelperBetty C Munter is an 80 y.o. female who was admitted 10/11/2013 for operative treatment ofHip arthritis. Patient has severe unremitting pain that affects sleep, daily activities, and work/hobbies. After pre-op clearance the patient was taken to the operating room on 10/11/2013 and underwent  Procedure(s): TOTAL HIP ARTHROPLASTY.    Patient was given perioperative antibiotics: Anti-infectives   Start     Dose/Rate Route Frequency Ordered Stop   10/14/13 0800  cefUROXime (CEFTIN) tablet 500 mg     500 mg Oral 2 times daily with meals 10/13/13 1720 10/21/13 0759   10/14/13 0000  cefUROXime (CEFTIN) 500 MG tablet     500 mg Oral 2 times daily with meals 10/14/13 1038     10/11/13 2000  ciprofloxacin (CIPRO) tablet 250 mg  Status:  Discontinued    Comments:  Pt has pre op uti.   250 mg Oral 2 times daily 10/11/13 1802 10/13/13 1720   10/11/13 0600  ceFAZolin (ANCEF) IVPB 2 g/50 mL premix     2 g 100 mL/hr over 30 Minutes Intravenous On call to O.R. 10/10/13 1313 10/11/13 1255       Patient was given sequential compression devices, early ambulation, and chemoprophylaxis to prevent DVT.  Patient benefited maximally from hospital stay and there were no complications.    Recent vital signs: Patient Vitals for the past 24 hrs:  BP Temp Temp src Pulse Resp SpO2  10/14/13 0608 131/51 mmHg 98.1 F (36.7 C) Oral 85 18 98 %  10/13/13 2000 - - - - 18 98 %  10/13/13 1900 121/46 mmHg 98.9  F (37.2 C) Oral 96 18 98 %  10/13/13 1600 - - - - 18 -  10/13/13 1425 123/53 mmHg 98.8 F (37.1 C) - 86 16 100 %     Recent laboratory studies:  Recent Labs  10/12/13 0627 10/13/13 0712 10/14/13 0557  WBC 8.2 9.9 10.1  HGB 8.4* 8.0* 7.5*  HCT 25.8* 24.7* 22.7*  PLT 275 219 271  NA 134*  --   --   K 4.4  --   --   CL 97  --   --   CO2 22  --   --   BUN 12  --   --   CREATININE 1.11*  --   --   GLUCOSE 114*  --   --   CALCIUM 8.6  --   --      Discharge Medications:     Medication List    STOP taking these medications       aspirin 81 MG tablet  Replaced by:  aspirin EC 325 MG tablet      TAKE these medications       ALIGN PO  Take 1 capsule by mouth daily.     aspirin EC 325 MG tablet  Take 1 tablet (325 mg total) by mouth 2 (two) times daily.     atorvastatin 40 MG tablet  Commonly known  as:  LIPITOR  Take 40 mg by mouth at bedtime.     AZO CRANBERRY PO  Take 2 tablets by mouth daily.     CALCIUM 600 + D 600-200 MG-UNIT Tabs  Generic drug:  Calcium Carb-Cholecalciferol  Take 1 tablet by mouth 2 (two) times daily.     cefUROXime 500 MG tablet  Commonly known as:  CEFTIN  Take 1 tablet (500 mg total) by mouth 2 (two) times daily with a meal.     CELEBREX 200 MG capsule  Generic drug:  celecoxib  Take 200 mg by mouth 2 (two) times daily.     ferrous sulfate 325 (65 FE) MG tablet  Take 325 mg by mouth 2 (two) times daily with a meal.     GAVISCON PO  Take 5 mLs by mouth 3 (three) times daily before meals.     HYDROcodone-acetaminophen 5-325 MG per tablet  Commonly known as:  NORCO/VICODIN  Take 1-2 tablets by mouth every 4 (four) hours as needed for moderate pain.     losartan 100 MG tablet  Commonly known as:  COZAAR  Take 100 mg by mouth daily.     metoprolol 50 MG tablet  Commonly known as:  LOPRESSOR  Take 50 mg by mouth 2 (two) times daily.     omeprazole 20 MG capsule  Commonly known as:  PRILOSEC  Take 20 mg by mouth daily.      pregabalin 50 MG capsule  Commonly known as:  LYRICA  Take 50 mg by mouth at bedtime.     tizanidine 2 MG capsule  Commonly known as:  ZANAFLEX  Take 1 capsule (2 mg total) by mouth 3 (three) times daily.     VITAMIN B-12 PO  Take 1 tablet by mouth daily.        Diagnostic Studies: Dg Chest 2 View  10/07/2013   CLINICAL DATA:  Total hip arthroplasty preop evaluation  EXAM: CHEST  2 VIEW  COMPARISON:  02/16/2013  FINDINGS: Cardiac enlargement without heart failure. Elevated right hemidiaphragm. Moderately large hiatal hernia  Negative for infiltrate effusion or mass.  Thoraco lumbar kyphoscoliosis  IMPRESSION: No active cardiopulmonary disease.   Electronically Signed   By: Marlan Palau M.D.   On: 10/07/2013 16:17   Dg Pelvis Portable  10/11/2013   CLINICAL DATA:  Status post right hip replacement  EXAM: PORTABLE PELVIS 1-2 VIEWS  COMPARISON:  None.  FINDINGS: Bilateral total hip replacements are seen. No acute fracture or dislocation is noted. No loosening is noted.   Electronically Signed   By: Alcide Clever M.D.   On: 10/11/2013 15:14    Disposition:       Discharge Orders   Future Orders Complete By Expires   Call MD / Call 911  As directed    Comments:     If you experience chest pain or shortness of breath, CALL 911 and be transported to the hospital emergency room.  If you develope a fever above 101 F, pus (white drainage) or increased drainage or redness at the wound, or calf pain, call your surgeon's office.   Change dressing  As directed    Comments:     You may change your dressing on day 5, then change the dressing daily with sterile 4 x 4 inch gauze dressing and paper tape.  You may clean the incision with alcohol prior to redressing   Constipation Prevention  As directed    Comments:     Drink  plenty of fluids.  Prune juice may be helpful.  You may use a stool softener, such as Colace (over the counter) 100 mg twice a day.  Use MiraLax (over the counter) for  constipation as needed.   Diet - low sodium heart healthy  As directed    Discharge instructions  As directed    Comments:     Follow up in office with Dr. Turner Daniels in 2 weeks.   Driving restrictions  As directed    Comments:     No driving for 2 weeks   Follow the hip precautions as taught in Physical Therapy  As directed    Increase activity slowly as tolerated  As directed    Patient may shower  As directed    Comments:     You may shower without a dressing once there is no drainage.  Do not wash over the wound.  If drainage remains, cover wound with plastic wrap and then shower.      Follow-up Information   Follow up with Nestor Lewandowsky, MD In 2 weeks.   Specialty:  Orthopedic Surgery   Contact information:   [address] [place] [address] [phone]        Signed: Henry Russel 10/14/2013, 10:39 AM

## 2013-10-14 NOTE — Progress Notes (Signed)
CSW (Clinical Child psychotherapistocial Worker) had confirmed bed at [facility] for pt but was informed by pt nurse that pt daughter is wanting to change facility. CSW spoke with pt daughter who informed CSW that [facility]. CSW spoke with new facility and they are able to offer a bed for pt tomorrow. CSW did inform pt daughter and RN CM that dc will be possible for tomorrow at desired facility.   Sharlynn Seckinger, LCSWA [phone]

## 2013-10-14 NOTE — Progress Notes (Signed)
Physical Therapy Treatment Patient Details Name: Victoria HelperBetty C Lewis MRN: [id number] DOB: [birth year] Today's Date: 10/14/2013 Time: [id number] PT Time Calculation (min): 24 min  PT Assessment / Plan / Recommendation  History of Present Illness Pt presents with R THA and long hx of decreased mobility per daughter.     PT Comments   Pt. With significant fear of falling when attempting mobility and was unable to stand upright over legs (has posterior lean).  Daughter in room and providing encouragement.  Pt. Remains at 2 max assist level for functional mobility.  Follow Up Recommendations  SNF     Does the patient have the potential to tolerate intense rehabilitation     Barriers to Discharge        Equipment Recommendations  None recommended by PT    Recommendations for Other Services    Frequency 7X/week   Progress towards PT Goals Progress towards PT goals: Progressing toward goals  Plan Current plan remains appropriate    Precautions / Restrictions Precautions Precautions: Posterior Hip;Fall Precaution Booklet Issued: Yes (comment) Precaution Comments: no carryover of precautions for pt Restrictions Weight Bearing Restrictions: Yes RLE Weight Bearing: Weight bearing as tolerated   Pertinent Vitals/Pain See vitals tab Pt. Appears more limited in her mobility by fear but does have pain with bed mobility especially    Mobility  Bed Mobility Overal bed mobility: Needs Assistance Bed Mobility: Supine to Sit Rolling: Max assist;+2 for physical assistance General bed mobility comments: Pt. could initiate moving hips toward edge of bed but reuqired 2 max assist to complete transition up to sitting position Transfers Overall transfer level: Needs assistance Equipment used: Rolling walker (2 wheeled);None Transfers: Sit to/from [employer] CorporationStand;Stand Pivot Transfers Sit to Stand: +2 physical assistance;Total assist Stand pivot transfers: Total assist;+2 physical assistance General transfer  comment: Pt. performed sit>stand and standing balance , was seated then completed stand pivot transfer with 2 max assist.  Pt. unable to achieve upright standing and kept hips posterior to her base of support, partly out of fear of falling and otherwise due to overall decreased tolerance to activity Ambulation/Gait Ambulation/Gait assistance:  (pt. unable)    Exercises Total Joint Exercises Ankle Circles/Pumps: AROM;Both;10 reps Long Arc Quad: AROM;Right;10 reps;Seated   PT Diagnosis:    PT Problem List:   PT Treatment Interventions:     PT Goals (current goals can now be found in the care plan section)    Visit Information  Last PT Received On: 10/14/13 Assistance Needed: +2 History of Present Illness: Pt presents with R THA and long hx of decreased mobility per daughter.      Subjective Data  Subjective: Pt. presents to PT in bed , receiving blood, with daughter in room   Cognition  Cognition Arousal/Alertness: Awake/alert Behavior During Therapy: WFL for tasks assessed/performed Overall Cognitive Status: History of cognitive impairments - at baseline Memory: Decreased recall of precautions;Decreased short-term memory    Balance  Balance Overall balance assessment: Needs assistance Sitting-balance support: Bilateral upper extremity supported;Feet supported Sitting balance-Leahy Scale: Fair Standing balance support: Bilateral upper extremity supported;During functional activity Standing balance-Leahy Scale: Zero Standing balance comment: significant posterior lean in standing attempt  End of Session PT - End of Session Equipment Utilized During Treatment: Gait belt Activity Tolerance: Patient limited by fatigue;Patient limited by pain Patient left: in chair;with call bell/phone within reach;with family/visitor present Nurse Communication: Mobility status   GP     Ferman HammingBlankenship, Dantae Meunier B 10/14/2013, 2:06 PM Weldon PickingSusan Analuisa Tudor PT Acute Rehab Services [phone] Beeper  [phone]   

## 2013-10-15 MED ORDER — CEFUROXIME AXETIL 500 MG PO TABS: 500.0000 mg | ORAL_TABLET | Freq: Two times a day (BID) | ORAL | Status: DC

## 2013-10-15 MED ORDER — METHOCARBAMOL 500 MG PO TABS: 500.0000 mg | ORAL_TABLET | Freq: Two times a day (BID) | ORAL | Status: DC

## 2013-10-15 NOTE — Progress Notes (Signed)
CSW (Clinical Social Worker) prepared pt dc packet and placed with shadow chart. CSW arranged non-emergent ambulance transport at 1:30pm. Pt, pt family, pt nurse, and facility informed. CSW signing off.  Janaria Mccammon, LCSWA [phone]  

## 2013-10-15 NOTE — Progress Notes (Signed)
Patient has had PNA vaccination.

## 2013-10-15 NOTE — Discharge Summary (Signed)
Patient ID: Victoria Lewis MRN: [id number] DOB/AGE: Jul 30, 1928 80 y.o.  Admit date: 10/11/2013 Discharge date: 10/15/2013  Admission Diagnoses:  Principal Problem:   Right Hip arthritis   Discharge Diagnoses:  Same  Past Medical History  Diagnosis Date  . Hypertension   . GERD (gastroesophageal reflux disease)   . H/O hiatal hernia   . Arthritis   . Anemia     iron    Surgeries: Procedure(s): TOTAL HIP ARTHROPLASTY on 10/11/2013   Consultants:    Discharged Condition: Improved  Hospital Course: Victoria Lewis is an 80 y.o. female who was admitted 10/11/2013 for operative treatment ofHip arthritis. Patient has severe unremitting pain that affects sleep, daily activities, and work/hobbies. After pre-op clearance the patient was taken to the operating room on 10/11/2013 and underwent  Procedure(s): TOTAL HIP ARTHROPLASTY.    Patient was given perioperative antibiotics: Anti-infectives   Start     Dose/Rate Route Frequency Ordered Stop   10/15/13 0000  cefUROXime (CEFTIN) 500 MG tablet     500 mg Oral 2 times daily with meals 10/15/13 0723     10/14/13 0800  cefUROXime (CEFTIN) tablet 500 mg     500 mg Oral 2 times daily with meals 10/13/13 1720 10/21/13 0759   10/14/13 0000  cefUROXime (CEFTIN) 500 MG tablet  Status:  Discontinued     500 mg Oral 2 times daily with meals 10/14/13 1038 10/15/13    10/11/13 2000  ciprofloxacin (CIPRO) tablet 250 mg  Status:  Discontinued    Comments:  Pt has pre op uti.   250 mg Oral 2 times daily 10/11/13 1802 10/13/13 1720   10/11/13 0600  ceFAZolin (ANCEF) IVPB 2 g/50 mL premix     2 g 100 mL/hr over 30 Minutes Intravenous On call to O.R. 10/10/13 1313 10/11/13 1255       Patient was given sequential compression devices, early ambulation, and chemoprophylaxis to prevent DVT.  Patient benefited maximally from hospital stay and there were no complications.    Recent vital signs: Patient Vitals for the past 24 hrs:  BP Temp Temp src  Pulse Resp SpO2  10/15/13 0529 148/67 mmHg 99.1 F (37.3 C) - 85 18 95 %  10/15/13 0400 - - - - 16 -  10/15/13 0000 - - - - 16 -  10/14/13 2056 134/57 mmHg 98.6 F (37 C) Oral 87 18 95 %  10/14/13 2000 - - - - 16 -  10/14/13 1430 111/56 mmHg 99 F (37.2 C) Oral 79 16 97 %  10/14/13 1340 142/60 mmHg 98.4 F (36.9 C) Oral 85 18 96 %  10/14/13 1240 130/59 mmHg 100.8 F (38.2 C) Oral 91 16 98 %  10/14/13 1142 143/52 mmHg 98.4 F (36.9 C) Oral 97 18 96 %  10/14/13 1115 114/48 mmHg 99 F (37.2 C) Oral 94 16 98 %     Recent laboratory studies:  Recent Labs  10/13/13 0712 10/14/13 0557  WBC 9.9 10.1  HGB 8.0* 7.5*  HCT 24.7* 22.7*  PLT 219 271     Discharge Medications:     Medication List    STOP taking these medications       aspirin 81 MG tablet  Replaced by:  aspirin EC 325 MG tablet      TAKE these medications       ALIGN PO  Take 1 capsule by mouth daily.     aspirin EC 325 MG tablet  Take 1 tablet (325 mg total) by  mouth 2 (two) times daily.     atorvastatin 40 MG tablet  Commonly known as:  LIPITOR  Take 40 mg by mouth at bedtime.     AZO CRANBERRY PO  Take 2 tablets by mouth daily.     CALCIUM 600 + D 600-200 MG-UNIT Tabs  Generic drug:  Calcium Carb-Cholecalciferol  Take 1 tablet by mouth 2 (two) times daily.     cefUROXime 500 MG tablet  Commonly known as:  CEFTIN  Take 1 tablet (500 mg total) by mouth 2 (two) times daily with a meal.     CELEBREX 200 MG capsule  Generic drug:  celecoxib  Take 200 mg by mouth 2 (two) times daily.     ferrous sulfate 325 (65 FE) MG tablet  Take 325 mg by mouth 2 (two) times daily with a meal.     GAVISCON PO  Take 5 mLs by mouth 3 (three) times daily before meals.     HYDROcodone-acetaminophen 5-325 MG per tablet  Commonly known as:  NORCO/VICODIN  Take 1-2 tablets by mouth every 4 (four) hours as needed for moderate pain.     losartan 100 MG tablet  Commonly known as:  COZAAR  Take 100 mg by mouth  daily.     methocarbamol 500 MG tablet  Commonly known as:  ROBAXIN  Take 1 tablet (500 mg total) by mouth 2 (two) times daily with a meal.     metoprolol 50 MG tablet  Commonly known as:  LOPRESSOR  Take 50 mg by mouth 2 (two) times daily.     omeprazole 20 MG capsule  Commonly known as:  PRILOSEC  Take 20 mg by mouth daily.     pregabalin 50 MG capsule  Commonly known as:  LYRICA  Take 50 mg by mouth at bedtime.     VITAMIN B-12 PO  Take 1 tablet by mouth daily.        Diagnostic Studies: Dg Chest 2 View  10/07/2013   CLINICAL DATA:  Total hip arthroplasty preop evaluation  EXAM: CHEST  2 VIEW  COMPARISON:  02/16/2013  FINDINGS: Cardiac enlargement without heart failure. Elevated right hemidiaphragm. Moderately large hiatal hernia  Negative for infiltrate effusion or mass.  Thoraco lumbar kyphoscoliosis  IMPRESSION: No active cardiopulmonary disease.   Electronically Signed   By: Marlan Palauharles  Clark M.D.   On: 10/07/2013 16:17   Dg Pelvis Portable  10/11/2013   CLINICAL DATA:  Status post right hip replacement  EXAM: PORTABLE PELVIS 1-2 VIEWS  COMPARISON:  None.  FINDINGS: Bilateral total hip replacements are seen. No acute fracture or dislocation is noted. No loosening is noted.   Electronically Signed   By: Alcide CleverMark  Lukens M.D.   On: 10/11/2013 15:14    Disposition:       Discharge Orders   Future Orders Complete By Expires   Call MD / Call 911  As directed    Comments:     If you experience chest pain or shortness of breath, CALL 911 and be transported to the hospital emergency room.  If you develope a fever above 101 F, pus (white drainage) or increased drainage or redness at the wound, or calf pain, call your surgeon's office.   Change dressing  As directed    Comments:     You may change your dressing on day 5, then change the dressing daily with sterile 4 x 4 inch gauze dressing and paper tape.  You may clean the incision with alcohol  prior to redressing   Constipation  Prevention  As directed    Comments:     Drink plenty of fluids.  Prune juice may be helpful.  You may use a stool softener, such as Colace (over the counter) 100 mg twice a day.  Use MiraLax (over the counter) for constipation as needed.   Diet - low sodium heart healthy  As directed    Discharge instructions  As directed    Comments:     Follow up in office with Dr. Turner Daniels in 2 weeks.   Driving restrictions  As directed    Comments:     No driving for 2 weeks   Follow the hip precautions as taught in Physical Therapy  As directed    Increase activity slowly as tolerated  As directed    Patient may shower  As directed    Comments:     You may shower without a dressing once there is no drainage.  Do not wash over the wound.  If drainage remains, cover wound with plastic wrap and then shower.      Follow-up Information   Follow up with Nestor Lewandowsky, MD In 2 weeks.   Specialty:  Orthopedic Surgery   Contact information:   [address] [place] [address] [phone]        Signed: Vear Clock ERIC R 10/15/2013, 7:26 AM

## 2013-10-15 NOTE — Progress Notes (Signed)
PATIENT ID: Victoria Lewis  MRN: [id number]  DOB/AGE:  Sep 17, 1927 / 80 y.o.  4 Days Post-Op Procedure(s) (LRB): TOTAL HIP ARTHROPLASTY (Right)    PROGRESS NOTE Subjective: Patient is alert, oriented,no Nausea, no Vomiting, yes passing gas, no Bowel Movement. Taking PO well. Denies SOB, Chest or Calf Pain. Using Incentive Spirometer, PAS in place. Ambulate WBAT Patient reports pain as moderate  .    Objective: Vital signs in last 24 hours: Filed Vitals:   10/14/13 2056 10/15/13 0000 10/15/13 0400 10/15/13 0529  BP: 134/57   148/67  Pulse: 87   85  Temp: 98.6 F (37 C)   99.1 F (37.3 C)  TempSrc: Oral     Resp: 18 16 16 18   SpO2: 95%   95%      Intake/Output from previous day: I/O last 3 completed shifts: In: 1055 [P.O.:480; I.V.:250; Blood:325] Out: -    Intake/Output this shift:     LABORATORY DATA:  Recent Labs  10/13/13 0712 10/14/13 0557  WBC 9.9 10.1  HGB 8.0* 7.5*  HCT 24.7* 22.7*  PLT 219 271    Examination: Neurologically intact Neurovascular intact Sensation intact distally Intact pulses distally Dorsiflexion/Plantar flexion intact Incision: dressing C/D/I and no drainage No cellulitis present} XR AP&Lat of hip shows well placed\fixed THA  Assessment:   4 Days Post-Op Procedure(s) (LRB): TOTAL HIP ARTHROPLASTY (Right) ADDITIONAL DIAGNOSIS:  Acute Blood Loss Anemia and Hypertension  Plan: PT/OT WBAT, THA  posterior precautions  DVT Prophylaxis: SCDx72 hrs, ASA 325 mg BID x 2 weeks  DISCHARGE PLAN: Skilled Nursing Facility/Rehab [facility] today  DISCHARGE NEEDS: HHPT, HHRN, Walker and 3-in-1 comode seat  Pt is to continue with ceftin until finished. she is given a prescription for a preop UTI.

## 2013-10-18 LAB — TYPE AND SCREEN
ABO/RH(D): O POS
Antibody Screen: NEGATIVE
UNIT DIVISION: 0
Unit division: 0

## 2014-02-28 DIAGNOSIS — N390 Urinary tract infection, site not specified: Secondary | ICD-10-CM | POA: Diagnosis present

## 2014-03-04 DIAGNOSIS — Z87718 Personal history of other specified (corrected) congenital malformations of genitourinary system: Secondary | ICD-10-CM | POA: Insufficient documentation

## 2014-03-04 DIAGNOSIS — Z87448 Personal history of other diseases of urinary system: Secondary | ICD-10-CM | POA: Insufficient documentation

## 2014-04-11 DIAGNOSIS — N183 Chronic kidney disease, stage 3 unspecified: Secondary | ICD-10-CM | POA: Diagnosis present

## 2014-07-13 ENCOUNTER — Emergency Department (HOSPITAL_COMMUNITY): Payer: Medicare Other

## 2014-07-13 ENCOUNTER — Encounter (HOSPITAL_COMMUNITY): Payer: Self-pay

## 2014-07-13 ENCOUNTER — Inpatient Hospital Stay (HOSPITAL_COMMUNITY)
Admission: EM | Admit: 2014-07-13 | Discharge: 2014-07-18 | DRG: 309 | Disposition: A | Discharge status: Home / Self Care | Payer: Medicare Other | Attending: Family Medicine | Admitting: Family Medicine

## 2014-07-13 DIAGNOSIS — R0682 Tachypnea, not elsewhere classified: Secondary | ICD-10-CM

## 2014-07-13 DIAGNOSIS — K219 Gastro-esophageal reflux disease without esophagitis: Secondary | ICD-10-CM | POA: Diagnosis present

## 2014-07-13 DIAGNOSIS — N183 Chronic kidney disease, stage 3 unspecified: Secondary | ICD-10-CM | POA: Diagnosis present

## 2014-07-13 DIAGNOSIS — D649 Anemia, unspecified: Secondary | ICD-10-CM | POA: Diagnosis present

## 2014-07-13 DIAGNOSIS — R202 Paresthesia of skin: Secondary | ICD-10-CM | POA: Diagnosis not present

## 2014-07-13 DIAGNOSIS — N390 Urinary tract infection, site not specified: Secondary | ICD-10-CM | POA: Diagnosis present

## 2014-07-13 DIAGNOSIS — Z96649 Presence of unspecified artificial hip joint: Secondary | ICD-10-CM | POA: Diagnosis present

## 2014-07-13 DIAGNOSIS — R000 Tachycardia, unspecified: Secondary | ICD-10-CM | POA: Insufficient documentation

## 2014-07-13 DIAGNOSIS — Z7982 Long term (current) use of aspirin: Secondary | ICD-10-CM

## 2014-07-13 DIAGNOSIS — R200 Anesthesia of skin: Secondary | ICD-10-CM

## 2014-07-13 DIAGNOSIS — I442 Atrioventricular block, complete: Principal | ICD-10-CM | POA: Diagnosis present

## 2014-07-13 DIAGNOSIS — I129 Hypertensive chronic kidney disease with stage 1 through stage 4 chronic kidney disease, or unspecified chronic kidney disease: Secondary | ICD-10-CM | POA: Diagnosis present

## 2014-07-13 DIAGNOSIS — K5900 Constipation, unspecified: Secondary | ICD-10-CM | POA: Diagnosis present

## 2014-07-13 DIAGNOSIS — R55 Syncope and collapse: Secondary | ICD-10-CM

## 2014-07-13 DIAGNOSIS — I459 Conduction disorder, unspecified: Secondary | ICD-10-CM

## 2014-07-13 DIAGNOSIS — E871 Hypo-osmolality and hyponatremia: Secondary | ICD-10-CM | POA: Diagnosis present

## 2014-07-13 DIAGNOSIS — F0390 Unspecified dementia without behavioral disturbance: Secondary | ICD-10-CM | POA: Diagnosis present

## 2014-07-13 HISTORY — DX: Syncope and collapse: R55

## 2014-07-13 LAB — CBC WITH DIFFERENTIAL/PLATELET
Basophils Absolute: 0.1 10*3/uL (ref 0.0–0.1)
Basophils Relative: 1 % (ref 0–1)
Eosinophils Absolute: 0.3 10*3/uL (ref 0.0–0.7)
Eosinophils Relative: 3 % (ref 0–5)
HEMATOCRIT: 40.2 % (ref 36.0–46.0)
HEMOGLOBIN: 13.5 g/dL (ref 12.0–15.0)
LYMPHS ABS: 0.7 10*3/uL (ref 0.7–4.0)
LYMPHS PCT: 8 % — AB (ref 12–46)
MCH: 32.5 pg (ref 26.0–34.0)
MCHC: 33.6 g/dL (ref 30.0–36.0)
MCV: 96.9 fL (ref 78.0–100.0)
MONO ABS: 0.6 10*3/uL (ref 0.1–1.0)
MONOS PCT: 7 % (ref 3–12)
NEUTROS PCT: 81 % — AB (ref 43–77)
Neutro Abs: 6.7 10*3/uL (ref 1.7–7.7)
Platelets: 347 10*3/uL (ref 150–400)
RBC: 4.15 MIL/uL (ref 3.87–5.11)
RDW: 13.2 % (ref 11.5–15.5)
WBC: 8.4 10*3/uL (ref 4.0–10.5)

## 2014-07-13 LAB — BASIC METABOLIC PANEL
Anion gap: 12 (ref 5–15)
BUN: 18 mg/dL (ref 6–23)
CO2: 26 meq/L (ref 19–32)
CREATININE: 1.09 mg/dL (ref 0.50–1.10)
Calcium: 10.3 mg/dL (ref 8.4–10.5)
Chloride: 93 mEq/L — ABNORMAL LOW (ref 96–112)
GFR calc Af Amer: 52 mL/min — ABNORMAL LOW (ref >90)
GFR calc non Af Amer: 45 mL/min — ABNORMAL LOW (ref >90)
Glucose, Bld: 93 mg/dL (ref 70–99)
Potassium: 4.3 mEq/L (ref 3.7–5.3)
Sodium: 131 mEq/L — ABNORMAL LOW (ref 137–147)

## 2014-07-13 LAB — CK: Total CK: 121 U/L (ref 7–177)

## 2014-07-13 LAB — I-STAT TROPONIN, ED: Troponin i, poc: 0 ng/mL (ref 0.00–0.08)

## 2014-07-13 LAB — TROPONIN I: Troponin I: <0.3 ng/mL (ref <0.30)

## 2014-07-13 MED ORDER — CALCIUM CARBONATE 600 MG PO TABS: 600.0000 mg | ORAL_TABLET | Freq: Two times a day (BID) | ORAL | Status: DC

## 2014-07-13 MED ORDER — VITAMIN D 1000 UNITS PO TABS
2000.0000 [IU] | ORAL_TABLET | Freq: Every day | ORAL | Status: DC
  Administered 2014-07-14 – 2014-07-18 (×5): 2000 [IU] via ORAL
  Filled 2014-07-13 (×5): qty 2

## 2014-07-13 MED ORDER — CALCIUM CARBONATE 1250 (500 CA) MG PO TABS
1.0000 | ORAL_TABLET | Freq: Two times a day (BID) | ORAL | Status: DC
  Administered 2014-07-14: 500 mg via ORAL
  Filled 2014-07-13: qty 1

## 2014-07-13 MED ORDER — TRAZODONE HCL 50 MG PO TABS: 25.0000 mg | ORAL_TABLET | Freq: Every evening | ORAL | Status: DC | PRN

## 2014-07-13 MED ORDER — SODIUM CHLORIDE 0.9 % IV SOLN: 250.0000 mL | INTRAVENOUS | Status: DC | PRN

## 2014-07-13 MED ORDER — HEPARIN SODIUM (PORCINE) 5000 UNIT/ML IJ SOLN
5000.0000 [IU] | Freq: Three times a day (TID) | INTRAMUSCULAR | Status: DC
  Administered 2014-07-13 – 2014-07-17 (×12): 5000 [IU] via SUBCUTANEOUS
  Filled 2014-07-13 (×12): qty 1

## 2014-07-13 MED ORDER — ACETAMINOPHEN 650 MG RE SUPP: 650.0000 mg | Freq: Four times a day (QID) | RECTAL | Status: DC | PRN

## 2014-07-13 MED ORDER — SODIUM CHLORIDE 0.9 % IJ SOLN: 3.0000 mL | INTRAMUSCULAR | Status: DC | PRN

## 2014-07-13 MED ORDER — PANTOPRAZOLE SODIUM 40 MG PO TBEC
40.0000 mg | DELAYED_RELEASE_TABLET | Freq: Every day | ORAL | Status: DC
  Administered 2014-07-14 – 2014-07-18 (×5): 40 mg via ORAL
  Filled 2014-07-13 (×5): qty 1

## 2014-07-13 MED ORDER — SODIUM CHLORIDE 0.9 % IJ SOLN
3.0000 mL | Freq: Two times a day (BID) | INTRAMUSCULAR | Status: DC
  Administered 2014-07-13 – 2014-07-18 (×6): 3 mL via INTRAVENOUS

## 2014-07-13 MED ORDER — SENNA 8.6 MG PO TABS
1.0000 | ORAL_TABLET | Freq: Two times a day (BID) | ORAL | Status: DC
  Administered 2014-07-13 – 2014-07-18 (×10): 8.6 mg via ORAL
  Filled 2014-07-13 (×10): qty 1

## 2014-07-13 MED ORDER — FERROUS SULFATE 325 (65 FE) MG PO TABS
325.0000 mg | ORAL_TABLET | Freq: Every day | ORAL | Status: DC
  Administered 2014-07-14: 325 mg via ORAL
  Filled 2014-07-13: qty 1

## 2014-07-13 MED ORDER — CIPROFLOXACIN HCL 250 MG PO TABS
125.0000 mg | ORAL_TABLET | Freq: Every day | ORAL | Status: DC
  Administered 2014-07-14: 125 mg via ORAL
  Filled 2014-07-13 (×2): qty 0.5

## 2014-07-13 MED ORDER — ACETAMINOPHEN 500 MG PO TABS
500.0000 mg | ORAL_TABLET | Freq: Four times a day (QID) | ORAL | Status: DC
  Administered 2014-07-13 – 2014-07-18 (×19): 500 mg via ORAL
  Filled 2014-07-13 (×19): qty 1

## 2014-07-13 MED ORDER — RISAQUAD PO CAPS
1.0000 | ORAL_CAPSULE | Freq: Every day | ORAL | Status: DC
  Administered 2014-07-14 – 2014-07-18 (×5): 1 via ORAL
  Filled 2014-07-13 (×5): qty 1

## 2014-07-13 MED ORDER — ACETAMINOPHEN 325 MG PO TABS
650.0000 mg | ORAL_TABLET | Freq: Four times a day (QID) | ORAL | Status: DC | PRN
  Filled 2014-07-13: qty 2

## 2014-07-13 MED ORDER — VITAMIN B-12 1000 MCG PO TABS
1000.0000 ug | ORAL_TABLET | Freq: Every day | ORAL | Status: DC
  Administered 2014-07-14 – 2014-07-18 (×5): 1000 ug via ORAL
  Filled 2014-07-13 (×5): qty 1

## 2014-07-13 MED ORDER — SODIUM CHLORIDE 0.9 % IJ SOLN
3.0000 mL | Freq: Two times a day (BID) | INTRAMUSCULAR | Status: DC
  Administered 2014-07-15: 3 mL via INTRAVENOUS

## 2014-07-13 MED ORDER — ALIGN PO CAPS: 1.0000 | ORAL_CAPSULE | Freq: Every day | ORAL | Status: DC

## 2014-07-13 MED ORDER — VITAMIN D 50 MCG (2000 UT) PO CAPS: 2000.0000 [IU] | ORAL_CAPSULE | Freq: Every day | ORAL | Status: DC

## 2014-07-13 MED ORDER — ASPIRIN EC 81 MG PO TBEC
81.0000 mg | DELAYED_RELEASE_TABLET | Freq: Every day | ORAL | Status: DC
  Administered 2014-07-14 – 2014-07-18 (×5): 81 mg via ORAL
  Filled 2014-07-13 (×5): qty 1

## 2014-07-13 NOTE — Consult Note (Signed)
CARDIOLOGY CONSULT NOTE   Patient ID: Victoria Lewis MRN: [id number], DOB/AGE: [birth year]   Admit date: 07/13/2014 Date of Consult: 07/13/2014   Primary Physician: Paulina Fusi, MD Primary Cardiologist: Dr. Gypsy Balsam at [place]/Family wish to establish care with [facility]iology  Pt. Profile  frail 80 year old Caucasian female with past medical history of hypertension, GERD, anemia, stage III chronic kidney disease, and moderate to severe dementia present with syncope and transient complete heart block  Problem List  Past Medical History  Diagnosis Date  . Hypertension   . GERD (gastroesophageal reflux disease)   . H/O hiatal hernia   . Arthritis   . Anemia     iron    Past Surgical History  Procedure Laterality Date  . Joint replacement Left 2008  . Eye surgery Bilateral     cataracts   . Total hip arthroplasty Right 10/11/2013    Procedure: TOTAL HIP ARTHROPLASTY;  Surgeon: Nestor Lewandowsky, MD;  Location: [facility] OR;  Service: Orthopedics;  Laterality: Right;     Allergies  No Known Allergies  HPI    the patient is a frail 80 year old Caucasian female with past medical history of hypertension, GERD, anemia, stage III chronic kidney disease, and moderate to severe dementia. Patient also had some remote history of syncope, which the family has attributed to the use of hydrocodone in the past. Since then, she has been using Tylenol for pain. She had a loop recorder placed early last year, and her cardiologist has been monitoring the loop recorder data. She was told she has some pauses every now and then. Per family, her dementia prevented her from being active. She has been taking care of by her husband. She never have any known prior MI, and her last stress test was over a year ago. Family was not informed of any abnormal result with the stress test.  Patient was admitted to [facility] last week for UTI. Family states she gets UTI frequently. She also has some  constipation as well. Two days ago during her previous admission, she had a syncope episode. Family does not know if the patient was on telemetry at the time, and was not told what caused the episode of syncope. She was discharged yesterday. This morning, patient while sitting on the toilet, patient had episode of syncope and was out for roughly 5 minutes. EMS was called and the patient was referred to [facility] for further evaluation. Cardiology was consulted for syncope. We have asked [employer] to interrogate the loop recorder which showed patient had a 15 seconds complete heart block with heart rate of 30 around 11:08 AM. There was no significant pauses. Of note, patient's metoprolol was recently increased from 25 mg twice a day to 50 mg twice a day during her last admission.  Note, patient is a poor historian given moderate to severe dementia. Majority of the history was obtained from patient's son and daughter-in-law. Family wished the patient to establish care with [place] cardiology.   Marland Kitchen acetaminophen  500 mg Oral QID  . [START ON 07/14/2014] aspirin EC  81 mg Oral Daily  . bifidobacterium infantis  1 capsule Oral Daily  . ciprofloxacin  125 mg Oral Daily  . [START ON 07/14/2014] ferrous sulfate  325 mg Oral Daily  . heparin  5,000 Units Subcutaneous 3 times per day  . [START ON 07/14/2014] pantoprazole  40 mg Oral Daily  . senna  1 tablet Oral BID  . sodium chloride  3  mL Intravenous Q12H  . sodium chloride  3 mL Intravenous Q12H  . [START ON 07/14/2014] vitamin B-12  1,000 mcg Oral Daily  . Vitamin D  2,000 Units Oral Daily   Family History Family History  Problem Relation Age of Onset  . Stroke Mother [id number]    died of stroke     Social History History   Social History  . Marital Status: Married    Spouse Name: N/A    Number of Children: N/A  . Years of Education: N/A   Occupational History  . Not on file.   Social History Main Topics  . Smoking status: Never Smoker     . Smokeless tobacco: Never Used  . Alcohol Use: No  . Drug Use: No  . Sexual Activity: Not on file   Other Topics Concern  . Not on file   Social History Narrative     Review of Systems  General:  No chills, fever, night sweats or weight changes.  Cardiovascular:  No chest pain, dyspnea on exertion, edema, orthopnea, palpitations, paroxysmal nocturnal dyspnea. Dermatological: No rash, lesions/masses Respiratory: No cough. +dyspnea Urologic: No hematuria, dysuria Abdominal:   No nausea, vomiting, diarrhea, bright red blood per rectum, melena, or hematemesis Neurologic:  No visual changes. Chronic dementia Weakness, syncope All other systems reviewed and are otherwise negative except as noted above.  Physical Exam  Blood pressure 167/68, pulse 76, temperature 98.4 F (36.9 C), temperature source Oral, resp. rate 18, SpO2 98 %.  General: Pleasant, NAD Psych: Normal affect. Neuro: Alert and oriented X 3. Moves all extremities spontaneously. HEENT: Normal  Neck: Supple without bruits or JVD. Lungs:  Resp regular and unlabored, CTA. Heart: RRR no s3, s4, or murmurs. Abdomen: Soft, non-tender, non-distended, BS + x 4.  Extremities: No clubbing, cyanosis or edema. DP/PT/Radials 2+ and equal bilaterally.  Labs  No results for input(s): CKTOTAL, CKMB, TROPONINI in the last 72 hours. Lab Results  Component Value Date   WBC 8.4 07/13/2014   HGB 13.5 07/13/2014   HCT 40.2 07/13/2014   MCV 96.9 07/13/2014   PLT 347 07/13/2014     Recent Labs Lab 07/13/14 1445  NA 131*  K 4.3  CL 93*  CO2 26  BUN 18  CREATININE 1.09  CALCIUM 10.3  GLUCOSE 93    Radiology/Studies  Ct Head Wo Contrast  07/13/2014   CLINICAL DATA:  Found passed out on the toilet. Reason UTI. History of syncopal episodes.  EXAM: CT HEAD WITHOUT CONTRAST  TECHNIQUE: Contiguous axial images were obtained from the base of the skull through the vertex without intravenous contrast.  COMPARISON:  11/29/2013   FINDINGS: There is diffuse cerebral atrophy. Severe chronic microvascular changes throughout the deep white matter. Associated ventriculomegaly of the lateral ventricles. No hemorrhage or hydrocephalus. No acute infarction or mass lesion. No midline shift.  Air-fluid levels noted in the sphenoid sinuses, similar to prior study. Mastoid air cells are clear.  IMPRESSION: Advanced atrophy and extensive chronic microvascular changes.  No acute intracranial abnormality.   Electronically Signed   By: Charlett NoseKevin  Dover M.D.   On: 07/13/2014 13:13     ASSESSMENT AND PLAN  1. Syncope 2/2 transient complete heart block  - likely related to recently increased metoprolol during recent admission and vagal response while sitting on toilet  - continue to hold metoprolol  - if patient has recurrent 3rd degree heart block while off metoprolol, will need pacemaker  2. Complete heart block: see above 3. Hypertension  4. GERD, anemia 5. stage III chronic kidney disease 6. moderate to severe dementia   Signed, Azalee CourseMeng, Hao, PA-C 07/13/2014, 6:14 PM   Patient seen and examined. Agree with assessment and plan.Pleasantly demented 80 yo WF who was admitted today following another syncopal spell as noted above. Pt was recently hospitalized at [facility] with [facility]  With significant antibody resistance. While hospitalized due to markedly elevated BP her baseline metoprolol was increased from 25 mg bid to 50 bid. Today while attempting to have a bowel movement she had a vagal response and loop recording reveals 15 sec of CHB with HR decreased to 30 bpm.  Will dc metoprolol. Consider amlodipine for additional BP support. If patient develops tachyrhythmia requiring re-institution of negative chronotropic therapy or has recurrent bradyarrhymia will need pacemaker.  Lennette Biharihomas A. Kelly, MD, [facility] 07/13/2014 6:37 PM

## 2014-07-13 NOTE — ED Notes (Signed)
PA at the bedside.

## 2014-07-13 NOTE — ED Notes (Signed)
Attempted IV. Phlebotomy attempted to draw blood. Unable to obtain. Consult placed for IV Team.

## 2014-07-13 NOTE — ED Notes (Signed)
Got orthostatic VS sitting up in the bed instead of sitting with feet on the floor.  Unable to do standing.

## 2014-07-13 NOTE — ED Notes (Signed)
[employer] Representative asked for patient to have [facility] taken on her Loop Recorder so her MD in [place] could follow.

## 2014-07-13 NOTE — ED Notes (Signed)
Per EMS, [facility] from being treated for a UTI. Patient has been constipated for three days. They gave her Dulcolax suppositories this morning to help. Patient was found on the toilet. Patient was confused, but recognized daughter when she woke up. Patient denies pain. Patient has a history of Syncopal Episodes, UTI, and HTN. Vitals per EMS: 142/70, 72 HR, 16 RR, 99%, 134 CBG. Patient alert when arrived to the ED. Patient alert to person and place.

## 2014-07-13 NOTE — H&P (Signed)
Family Medicine Teaching [facility] Admission History and Physical Service Pager: [phone]  [name] Medical record number: [id number] Date of birth: Jun 11, 1928 Age: 80 y.o. Gender: female  Primary Care Provider: Paulina FusiSCHULTZ,DOUGLAS E, MD Consultants: None Code Status: Full  Chief Complaint: Syncope  Assessment and Plan: Victoria Lewis is a 80 y.o. female presenting with syncope. PMH is significant for previous syncopal episodes, HTN, CKD III, chronic cystitis, dementia, GERD, Anemia, Hiatal Hernia, and Arthritis.  # Syncope- two episodes of syncope with one near episode of syncope. Last episode occurred today after being given Dulculax and attempting bowel movement. Has loop recorder in place and has been told that her "heart occasionally stops for a few seconds." DDx. includes Cardiogenic vs. Vasovagal etiology. - [facility] - Cardiology consulted due to possible cardiogenic etiology  - Appreciate recommendations  - Currently [place] in [place], but states she would like to become established at a [place] in [place]. Requested consult with [facility] since she has family that goes there - Holding metoprolol - Obtain readings from loop recorder to determine what rhythm Victoria Lewis was in during episodes - EKG negative - Troponins negative. Will continue to monitor - CT Head- Advanced atrophy and extensive chronic microvascular changes. No acute intracranial abnormality.  #Hyponatremia: Sodium 131 on BMP today. - Continue to monitor with BMP tomorrow morning  # H/O UTI- [facility] with Ciprofloxacin - Prescribed Ciprofloxacin 250mg  and instructed to take 0.5 tablets daily for 30 days  - Continue current dose of Ciprofloxacin - Urinalysis pending - Note that daughter does not want catheter placed.   # HTN- Home medication of Metoprolol. - 136-170/ 72-102  while in ED - Metoprolol held due to potential heart block and cardiogenic etiology of syncope  # CKD Stage III- Creatinine 1.09 today. Baseline 0.9-1. - Continue to monitor with BMP in am  # H/O Anemia- Hemoglobin 13.5 today - Continue Ferrous Sulfate 325mg  - CBC tomorrow morning  # GERD- Pantoprazole 40mg   FEN/GI: Heart Healthy/Carb Modified; KVO Prophylaxis: Subcutaneous Heparin  Disposition: Placed in Observation.   History of Present Illness: Victoria Lewis is a 80 y.o. female with history of syncopal episodes, HTN, GERD, and anemia , presenting with one episode of syncope this morning.  She was recently discharged [facility] three days ago where she was being treated for a UTI with Ciprofloxacin. She had been complaining of constipation and was given Dulcolax this morning.  She was sitting on the toilet at the time of the episode and episode was witnessed by her daughter, who states that Victoria Lewis is never left alone.  Victoria Lewis was unconscious for approximately 5 minutes with no signs of seizure-like activity. After being aroused, Victoria Lewis did not appear confused. Victoria Lewis denies pain.  Another similar episode occurred while she was at [facility] earlier this week.  She felt pre-syncopal a few days prior while with her husband, but never lost consciousness. Daughter states Victoria Lewis has been wearing a loop recorder and that her cardiologist has said that her heart periodically stops for a few seconds.  She is currently [place] cardi[place], however she would like to transfer her care to [facility] due to convenience and because her husband is currently seen there.  Daughter states that her mother was complaining of right arm tingling and numbness and neck pain a few days ago, but that this  has resolved.  Daughter states that Victoria Lewis has been constipated, which was why she was given the Dulcolax; she had the syncopal episode  prior to bowel movement. No further complaints today.  Review Of Systems: Per HPI  Otherwise 12 point review of systems was performed and was unremarkable.  Patient Active Problem List   Diagnosis Date Noted  . Syncope 07/13/2014  . Right Hip arthritis 10/11/2013   Past Medical History: Past Medical History  Diagnosis Date  . Hypertension   . GERD (gastroesophageal reflux disease)   . H/O hiatal hernia   . Arthritis   . Anemia     iron   Past Surgical History: Past Surgical History  Procedure Laterality Date  . Joint replacement Left 2008  . Eye surgery Bilateral     cataracts   . Total hip arthroplasty Right 10/11/2013    Procedure: TOTAL HIP ARTHROPLASTY;  Surgeon: Nestor LewandowskyFrank J Rowan, MD;  Location: [facility] OR;  Service: Orthopedics;  Laterality: Right;   Social History: History  Substance Use Topics  . Smoking status: Never Smoker   . Smokeless tobacco: Never Used  . Alcohol Use: No   Please also refer to relevant sections of EMR.  Family History: No family history on file. Allergies and Medications: No Known Allergies No current facility-administered medications on file prior to encounter.   Current Outpatient Prescriptions on File Prior to Encounter  Medication Sig Dispense Refill  . Alum Hydroxide-Mag Carbonate (GAVISCON PO) Take 5 mLs by mouth 3 (three) times daily before meals.    . ferrous sulfate 325 (65 FE) MG tablet Take 325 mg by mouth daily.     . metoprolol (LOPRESSOR) 50 MG tablet Take 50 mg by mouth 2 (two) times daily.    Marland Kitchen. omeprazole (PRILOSEC) 20 MG capsule Take 20 mg by mouth daily.    . Probiotic Product (ALIGN PO) Take 1 capsule by mouth daily.      Objective: BP 136/78 mmHg  Pulse 102  Temp(Src) 97.4 F (36.3 C) (Oral)  Resp 19  SpO2 96% Exam: General: 80yo frail elderly woman resting comfortably in no apparent distress HEENT: PERRLA, EOM intact, chapped lips Cardiovascular: S1 and S2 noted, no murmurs/rubs/gallops, peripheral pulses  palpated Respiratory: Clear to auscultation bilaterally, no increased work of breathing noted, no wheezing/rales/rhonchi Abdomen: Soft, non-distended, non-tender, bowel sounds noted. Extremities: Compression stockings in place, peripheral pulses noted, mild +1 edema Skin: Warm, no rashes noted Neuro: CN exam limited due to demented state, but no deficits noted; no sensory loss or motor strength loss noted.  Labs and Imaging: CBC BMET   Recent Labs Lab 07/13/14 1445  WBC 8.4  HGB 13.5  HCT 40.2  PLT 347    Recent Labs Lab 07/13/14 1445  NA 131*  K 4.3  CL 93*  CO2 26  BUN 18  CREATININE 1.09  GLUCOSE 93  CALCIUM 10.3    Troponin- 0   Ct Head Wo Contrast  07/13/2014   CLINICAL DATA:  Found passed out on the toilet. Reason UTI. History of syncopal episodes.  EXAM: CT HEAD WITHOUT CONTRAST  TECHNIQUE: Contiguous axial images were obtained from the base of the skull through the vertex without intravenous contrast.  COMPARISON:  11/29/2013  FINDINGS: There is diffuse cerebral atrophy. Severe chronic microvascular changes throughout the deep white matter. Associated ventriculomegaly of the lateral ventricles. No hemorrhage or hydrocephalus. No acute infarction or mass lesion. No midline shift.  Air-fluid levels noted in the sphenoid sinuses, similar to prior  study. Mastoid air cells are clear.  IMPRESSION: Advanced atrophy and extensive chronic microvascular changes.  No acute intracranial abnormality.   Electronically Signed   By: Charlett Nose M.D.   On: 07/13/2014 13:13   Araceli Bouche, DO 07/13/2014, 3:37 PM PGY-1, [facility] FPTS Intern pager: [phone], text pages welcome  FPTS Upper-Level Resident Addendum  I have independently interviewed and examined the patient. I have discussed the above with the original author and agree with their documentation. My edits for correction/addition/clarification are in pink. Please see also any attending notes.   Abram Sander, MD PGY-2, [facility] Family [facility] FPTS Service pager: [phone] (text pages welcome through [facility])

## 2014-07-13 NOTE — ED Provider Notes (Signed)
CSN: [id number]     Arrival date & time 07/13/14  1218 History   First MD Initiated Contact with Patient 07/13/14 1219     Chief Complaint  Patient presents with  . Loss of Consciousness     (Consider location/radiation/quality/duration/timing/severity/associated sxs/prior Treatment) HPI Comments: Patient presents to the emergency department with chief complaints of syncopal episode. She is accompanied by her daughter, who states that she thinks that patient passed out while having a bowel movement today. She was found on the toilet. She was recently admitted at [facility] for [facility]TI. She is taking Cipro. Daughter states that the patient has been constipated for the past 3 days, and was given dulcolax suppository this morning. She then found the patient slumped over on the toilet. Patient was mildly confused at the time. There was no fall or injury. The daughter states the patient has been sleepy since the syncopal episode. Otherwise she is acting normally. Of note, the daughter states the patient has been complaining of new onset right arm numbness.  The history is provided by the patient and a relative. No language interpreter was used.    Past Medical History  Diagnosis Date  . Hypertension   . GERD (gastroesophageal reflux disease)   . H/O hiatal hernia   . Arthritis   . Anemia     iron   Past Surgical History  Procedure Laterality Date  . Joint replacement Left 2008  . Eye surgery Bilateral     cataracts   . Total hip arthroplasty Right 10/11/2013    Procedure: TOTAL HIP ARTHROPLASTY;  Surgeon: Nestor LewandowskyFrank J Rowan, MD;  Location: [facility] OR;  Service: Orthopedics;  Laterality: Right;   No family history on file. History  Substance Use Topics  . Smoking status: Never Smoker   . Smokeless tobacco: Never Used  . Alcohol Use: No   OB History    No data available     Review of Systems  Constitutional: Negative for fever and chills.  Respiratory: Negative for shortness of breath.    Cardiovascular: Negative for chest pain.  Gastrointestinal: Negative for nausea, vomiting, diarrhea and constipation.  Genitourinary: Negative for dysuria.  All other systems reviewed and are negative.     Allergies  Review of patient's allergies indicates no known allergies.  Home Medications   Prior to Admission medications   Medication Sig Start Date End Date Taking? Authorizing Provider  Alum Hydroxide-Mag Carbonate (GAVISCON PO) Take 5 mLs by mouth 3 (three) times daily before meals.    Historical Provider, MD  aspirin EC 325 MG tablet Take 1 tablet (325 mg total) by mouth 2 (two) times daily. 10/13/13   Allena KatzEric K Phillips, PA-C  atorvastatin (LIPITOR) 40 MG tablet Take 40 mg by mouth at bedtime.    Historical Provider, MD  Calcium Carb-Cholecalciferol (CALCIUM 600 + D) 600-200 MG-UNIT TABS Take 1 tablet by mouth 2 (two) times daily.    Historical Provider, MD  cefUROXime (CEFTIN) 500 MG tablet Take 1 tablet (500 mg total) by mouth 2 (two) times daily with a meal. 10/15/13   Allena KatzEric K Phillips, PA-C  CELEBREX 200 MG capsule Take 200 mg by mouth 2 (two) times daily. 09/10/13   Historical Provider, MD  Cranberry-Vitamin C-Probiotic (AZO CRANBERRY PO) Take 2 tablets by mouth daily.    Historical Provider, MD  Cyanocobalamin (VITAMIN B-12 PO) Take 1 tablet by mouth daily.    Historical Provider, MD  ferrous sulfate 325 (65 FE) MG tablet Take 325 mg by mouth  2 (two) times daily with a meal.    Historical Provider, MD  HYDROcodone-acetaminophen (NORCO/VICODIN) 5-325 MG per tablet Take 1-2 tablets by mouth every 4 (four) hours as needed for moderate pain. 10/13/13   Allena KatzEric K Phillips, PA-C  losartan (COZAAR) 100 MG tablet Take 100 mg by mouth daily.    Historical Provider, MD  methocarbamol (ROBAXIN) 500 MG tablet Take 1 tablet (500 mg total) by mouth 2 (two) times daily with a meal. 10/15/13   Allena KatzEric K Phillips, PA-C  metoprolol (LOPRESSOR) 50 MG tablet Take 50 mg by mouth 2 (two) times daily.    Historical  Provider, MD  omeprazole (PRILOSEC) 20 MG capsule Take 20 mg by mouth daily.    Historical Provider, MD  pregabalin (LYRICA) 50 MG capsule Take 50 mg by mouth at bedtime.    Historical Provider, MD  Probiotic Product (ALIGN PO) Take 1 capsule by mouth daily.    Historical Provider, MD   BP 155/86 mmHg  Pulse 66  Temp(Src) 97.4 F (36.3 C) (Oral)  Resp 16  SpO2 100% Physical Exam  Constitutional: She is oriented to person, place, and time. She appears well-developed and well-nourished.  Thin and frail-appearing  HENT:  Head: Normocephalic and atraumatic.  Eyes: Conjunctivae and EOM are normal. Pupils are equal, round, and reactive to light.  Neck: Normal range of motion. Neck supple.  Cardiovascular: Normal rate and regular rhythm.  Exam reveals no gallop and no friction rub.   No murmur heard. Pulmonary/Chest: Effort normal and breath sounds normal. No respiratory distress. She has no wheezes. She has no rales. She exhibits no tenderness.  Abdominal: Soft. Bowel sounds are normal. She exhibits no distension and no mass. There is no tenderness. There is no rebound and no guarding.  Musculoskeletal: Normal range of motion. She exhibits no edema or tenderness.  No pain on palpation of extremities  Neurological: She is alert and oriented to person, place, and time.  Skin: Skin is warm and dry.  Psychiatric: She has a normal mood and affect. Her behavior is normal. Judgment and thought content normal.  Nursing note and vitals reviewed.   ED Course  Procedures (including critical care time) Results for orders placed or performed during the hospital encounter of 07/13/14  CBC with Differential  Result Value Ref Range   WBC 8.4 4.0 - 10.5 K/uL   RBC 4.15 3.87 - 5.11 MIL/uL   Hemoglobin 13.5 12.0 - 15.0 g/dL   HCT 21.340.2 08.636.0 - 57.846.0 %   MCV 96.9 78.0 - 100.0 fL   MCH 32.5 26.0 - 34.0 pg   MCHC 33.6 30.0 - 36.0 g/dL   RDW 46.913.2 62.911.5 - 52.815.5 %   Platelets 347 150 - 400 K/uL   Neutrophils  Relative % 81 (H) 43 - 77 %   Neutro Abs 6.7 1.7 - 7.7 K/uL   Lymphocytes Relative 8 (L) 12 - 46 %   Lymphs Abs 0.7 0.7 - 4.0 K/uL   Monocytes Relative 7 3 - 12 %   Monocytes Absolute 0.6 0.1 - 1.0 K/uL   Eosinophils Relative 3 0 - 5 %   Eosinophils Absolute 0.3 0.0 - 0.7 K/uL   Basophils Relative 1 0 - 1 %   Basophils Absolute 0.1 0.0 - 0.1 K/uL  Basic metabolic panel  Result Value Ref Range   Sodium 131 (L) 137 - 147 mEq/L   Potassium 4.3 3.7 - 5.3 mEq/L   Chloride 93 (L) 96 - 112 mEq/L   CO2  26 19 - 32 mEq/L   Glucose, Bld 93 70 - 99 mg/dL   BUN 18 6 - 23 mg/dL   Creatinine, Ser 0.98 0.50 - 1.10 mg/dL   Calcium 11.9 8.4 - 14.7 mg/dL   GFR calc non Af Amer 45 (L) >90 mL/min   GFR calc Af Amer 52 (L) >90 mL/min   Anion gap 12 5 - 15  I-stat troponin, ED  Result Value Ref Range   Troponin i, poc 0.00 0.00 - 0.08 ng/mL   Comment 3           Ct Head Wo Contrast  07/13/2014   CLINICAL DATA:  Found passed out on the toilet. Reason UTI. History of syncopal episodes.  EXAM: CT HEAD WITHOUT CONTRAST  TECHNIQUE: Contiguous axial images were obtained from the base of the skull through the vertex without intravenous contrast.  COMPARISON:  11/29/2013  FINDINGS: There is diffuse cerebral atrophy. Severe chronic microvascular changes throughout the deep white matter. Associated ventriculomegaly of the lateral ventricles. No hemorrhage or hydrocephalus. No acute infarction or mass lesion. No midline shift.  Air-fluid levels noted in the sphenoid sinuses, similar to prior study. Mastoid air cells are clear.  IMPRESSION: Advanced atrophy and extensive chronic microvascular changes.  No acute intracranial abnormality.   Electronically Signed   By: Charlett Nose M.D.   On: 07/13/2014 13:13     Imaging Review No results found.   EKG Interpretation None      MDM   Final diagnoses:  Right arm numbness  Syncope, unspecified syncope type    Patient with syncopal episode. Sounds like  vasovagal event. We'll check basic labs, EKG, and head CT given new right arm numbness.  Patient with syncopal episode. Could be cardiac. Patient is somewhat high risk given age and risk factors. Will plan for observation admission. Patient seen by and discussed with Dr. Romeo Apple, who agrees with the plan.  Of note patient's daughter would like the patient to have helped in establishing care with cardiology in [place].  Roxy Horseman, PA-C 07/13/14 1548  Purvis Sheffield, MD 07/13/14 [phone]

## 2014-07-13 NOTE — ED Notes (Signed)
Patient returned from CT. Phlebotomy at the bedside.  

## 2014-07-13 NOTE — ED Notes (Signed)
Patient's family came out of room tearful and asked to see the MD. Governor SpeckingFamnily stated, "[facility]. She doesn't look good and I would just prefer a teaching hospital. She had a syncopal episode there and I [facility]." Family reassured that I would speak to MD about seeing her. Family assured that patient was stable and we were drawing blood work and attempting IV start.

## 2014-07-13 NOTE — Progress Notes (Signed)
Spoke with AES CorporationHannah RN.  MD at bedside placing line. To reorder consult if IV ordered and not obtained. Ronette DeterJessica Poff, RN

## 2014-07-13 NOTE — ED Notes (Signed)
Family at the bedside.

## 2014-07-13 NOTE — ED Notes (Signed)
Internal Medicine at the bedside. 

## 2014-07-13 NOTE — ED Provider Notes (Signed)
Date: 07/13/2014  Rate: 80  Rhythm: normal sinus rhythm  QRS Axis: borderline LAD  Intervals: normal  ST/T Wave abnormalities: normal  Conduction Disutrbances:none  Narrative Interpretation: No ST or T wave changes consistent with ischemia.    Old EKG Reviewed: unchanged    Purvis SheffieldForrest Dallyn Bergland, MD 07/14/14 1512

## 2014-07-13 NOTE — ED Notes (Signed)
MD instructed not to preform IN and Out to obtain urine.

## 2014-07-14 ENCOUNTER — Encounter (HOSPITAL_COMMUNITY): Payer: Self-pay | Admitting: General Practice

## 2014-07-14 DIAGNOSIS — D649 Anemia, unspecified: Secondary | ICD-10-CM | POA: Diagnosis present

## 2014-07-14 DIAGNOSIS — I442 Atrioventricular block, complete: Principal | ICD-10-CM

## 2014-07-14 DIAGNOSIS — Z7982 Long term (current) use of aspirin: Secondary | ICD-10-CM | POA: Diagnosis not present

## 2014-07-14 DIAGNOSIS — N183 Chronic kidney disease, stage 3 (moderate): Secondary | ICD-10-CM | POA: Diagnosis present

## 2014-07-14 DIAGNOSIS — E871 Hypo-osmolality and hyponatremia: Secondary | ICD-10-CM | POA: Diagnosis not present

## 2014-07-14 DIAGNOSIS — F0390 Unspecified dementia without behavioral disturbance: Secondary | ICD-10-CM | POA: Diagnosis not present

## 2014-07-14 DIAGNOSIS — R202 Paresthesia of skin: Secondary | ICD-10-CM | POA: Diagnosis present

## 2014-07-14 DIAGNOSIS — T671XXD Heat syncope, subsequent encounter: Secondary | ICD-10-CM

## 2014-07-14 DIAGNOSIS — Z96649 Presence of unspecified artificial hip joint: Secondary | ICD-10-CM | POA: Diagnosis present

## 2014-07-14 DIAGNOSIS — K219 Gastro-esophageal reflux disease without esophagitis: Secondary | ICD-10-CM | POA: Diagnosis present

## 2014-07-14 DIAGNOSIS — K5900 Constipation, unspecified: Secondary | ICD-10-CM | POA: Diagnosis present

## 2014-07-14 DIAGNOSIS — I129 Hypertensive chronic kidney disease with stage 1 through stage 4 chronic kidney disease, or unspecified chronic kidney disease: Secondary | ICD-10-CM | POA: Diagnosis present

## 2014-07-14 DIAGNOSIS — N390 Urinary tract infection, site not specified: Secondary | ICD-10-CM | POA: Diagnosis not present

## 2014-07-14 LAB — BASIC METABOLIC PANEL
ANION GAP: 16 — AB (ref 5–15)
BUN: 19 mg/dL (ref 6–23)
CALCIUM: 9.9 mg/dL (ref 8.4–10.5)
CO2: 22 meq/L (ref 19–32)
Chloride: 96 mEq/L (ref 96–112)
Creatinine, Ser: 1.16 mg/dL — ABNORMAL HIGH (ref 0.50–1.10)
GFR calc non Af Amer: 42 mL/min — ABNORMAL LOW (ref >90)
GFR, EST AFRICAN AMERICAN: 49 mL/min — AB (ref >90)
Glucose, Bld: 91 mg/dL (ref 70–99)
Potassium: 4.2 mEq/L (ref 3.7–5.3)
Sodium: 134 mEq/L — ABNORMAL LOW (ref 137–147)

## 2014-07-14 LAB — CBC
HEMATOCRIT: 38.1 % (ref 36.0–46.0)
HEMOGLOBIN: 12.8 g/dL (ref 12.0–15.0)
MCH: 32.4 pg (ref 26.0–34.0)
MCHC: 33.6 g/dL (ref 30.0–36.0)
MCV: 96.5 fL (ref 78.0–100.0)
Platelets: 354 10*3/uL (ref 150–400)
RBC: 3.95 MIL/uL (ref 3.87–5.11)
RDW: 13.3 % (ref 11.5–15.5)
WBC: 6.4 10*3/uL (ref 4.0–10.5)

## 2014-07-14 LAB — TROPONIN I

## 2014-07-14 MED ORDER — SODIUM CHLORIDE 0.9 % IV SOLN
INTRAVENOUS | Status: DC
  Administered 2014-07-14 – 2014-07-15 (×4): via INTRAVENOUS
  Administered 2014-07-16 – 2014-07-17 (×2): 75 mL via INTRAVENOUS
  Administered 2014-07-18: 03:00:00 via INTRAVENOUS

## 2014-07-14 MED ORDER — CIPROFLOXACIN HCL 500 MG PO TABS
250.0000 mg | ORAL_TABLET | Freq: Every day | ORAL | Status: DC
  Administered 2014-07-15 – 2014-07-16 (×2): 250 mg via ORAL
  Filled 2014-07-14 (×2): qty 1

## 2014-07-14 MED ORDER — ATROPINE SULFATE 0.1 MG/ML IJ SOLN
INTRAMUSCULAR | Status: AC
  Filled 2014-07-14: qty 10

## 2014-07-14 MED ORDER — CIPROFLOXACIN HCL 250 MG PO TABS: 125.0000 mg | ORAL_TABLET | Freq: Once | ORAL | Status: DC

## 2014-07-14 NOTE — Plan of Care (Signed)
Problem: Phase I Progression Outcomes Goal: Hemodynamically stable Outcome: Completed/Met Date Met:  07/14/14     

## 2014-07-14 NOTE — Plan of Care (Signed)
Problem: Acute Rehab OT Goals (only OT should resolve) Goal: Pt. Will Perform Toileting-Clothing Manipulation Outcome: Not Applicable Date Met:  86/57/84

## 2014-07-14 NOTE — Evaluation (Signed)
Physical Therapy Evaluation Patient Details Name: Victoria Lewis MRN: [id number] DOB: 02-24-1928 Today's Date: 07/14/2014   History of Present Illness  80 y.o. female presenting with syncope. PMH is significant for previous syncopal episodes, HTN, CKD III, chronic cystitis, dementia, GERD, Anemia, Hiatal Hernia, and Arthritis. [facility]. [facility].   Clinical Impression  Pt admitted with/for syncope and presents with significant limitations on evaluation.  Pt currently limited functionally due to the problems listed below.  (see problems list.)  Pt will benefit from PT to maximize function and safety to be able to get home safely with available assist of family and potential outside help.     Follow Up Recommendations Home health PT;Supervision/Assistance - 24 hour;Other (comment) (aide for help in the mornings--SNF refused by daughter at th)    Equipment Recommendations  Other (comment) (TBA, may need lift or other equipment)    Recommendations for Other Services       Precautions / Restrictions Precautions Precautions: Fall Precaution Comments: syncopal episodes      Mobility  Bed Mobility Overal bed mobility: +2 for physical assistance;Needs Assistance Bed Mobility: Supine to Sit     Supine to sit: Total assist;+2 for physical assistance     General bed mobility comments: Pt reaching with arms to rails with mod A but not initiatingmovement or ehlping to rol  Transfers Overall transfer level: Needs assistance Equipment used: 2 person hand held assist Transfers: Sit to/from UGI CorporationStand;Stand Pivot Transfers Sit to Stand: Total assist;Max assist Stand pivot transfers: Total assist;+2 physical assistance       General transfer comment: Ataxic gait pattern. Support for R ankle to prevent inversion  Ambulation/Gait             General Gait Details: not able  Stairs            Wheelchair Mobility     Modified Rankin (Stroke Patients Only)       Balance Overall balance assessment: Needs assistance Sitting-balance support: Bilateral upper extremity supported;Single extremity supported Sitting balance-Leahy Scale: Poor     Standing balance support: During functional activity;Bilateral upper extremity supported Standing balance-Leahy Scale: Zero Standing balance comment: significant posterior lean                             Pertinent Vitals/Pain Pain Assessment: Faces Faces Pain Scale: Hurts little more Pain Descriptors / Indicators: Discomfort;Grimacing Pain Intervention(s): Monitored during session;Limited activity within patient's tolerance    Home Living Family/patient expects to be discharged to:: Private residence Living Arrangements: Spouse/significant other;Children Available Help at Discharge: Available 24 hours/day;Family Type of Home: House Home Access: Ramped entrance     Home Layout: One level Home Equipment: Environmental consultantWalker - 2 wheels;Bedside commode;Shower seat;Grab bars - toilet;Grab bars - tub/shower;Transport chair;Toilet riser      Prior Function Level of Independence: Needs assistance   Gait / Transfers Assistance Needed: Family assisted with bed mobility and trasnfers @ min A. Pt ambulated with RW. mostly sat during the day.  ADL's / Homemaking Assistance Needed: Pt able to feed sefl and assist minimally with bathing dressing.         Hand Dominance        Extremity/Trunk Assessment   Upper Extremity Assessment: Generalized weakness           Lower Extremity Assessment: RLE deficits/detail;LLE deficits/detail RLE Deficits / Details: increased tone in extension with voluntary movement, but grossly  3/5 based on standing LLE Deficits / Details: grossly 3+/5 with stiffness on voluntary movement  Cervical / Trunk Assessment: Kyphotic;Other exceptions (history of scoliosis)  Communication   Communication: HOH  Cognition  Arousal/Alertness: Awake/alert Behavior During Therapy: Flat affect Overall Cognitive Status: History of cognitive impairments - at baseline                      General Comments      Exercises        Assessment/Plan    PT Assessment Patient needs continued PT services  PT Diagnosis Generalized weakness   PT Problem List Decreased strength;Decreased activity tolerance;Decreased balance;Decreased mobility;Decreased coordination;Decreased knowledge of use of DME  PT Treatment Interventions Gait training;Functional mobility training;Therapeutic activities;Therapeutic exercise;Balance training;Patient/family education   PT Goals (Current goals can be found in the Care Plan section) Acute Rehab PT Goals Patient Stated Goal: family states to return home PT Goal Formulation: With patient Time For Goal Achievement: 07/14/14 Potential to Achieve Goals: Fair    Frequency Min 3X/week   Barriers to discharge        Co-evaluation   Reason for Co-Treatment: For patient/therapist safety           End of Session   Activity Tolerance: Patient tolerated treatment well;Patient limited by lethargy Patient left: in chair;with call bell/phone within reach;with family/visitor present;Other (comment) (pt was positioned with pillows) Nurse Communication: Mobility status    Functional Assessment Tool Used: clinical judgement Functional Limitation: Mobility: Walking and moving around Mobility: Walking and Moving Around Current Status ([id number]([id number]): At least 80 percent but less than 100 percent impaired, limited or restricted Mobility: Walking and Moving Around Goal Status [phone]([id number]): At least 60 percent but less than 80 percent impaired, limited or restricted    Time: 1100-1130 PT Time Calculation (min): 30 min   Charges:   PT Evaluation $Initial PT Evaluation Tier I: 1 Procedure PT Treatments $Therapeutic Activity: 8-22 mins   PT G Codes:   Functional Assessment Tool Used: clinical  judgement Functional Limitation: Mobility: Walking and moving around    Tulani Kidney, Eliseo GumKenneth V 07/14/2014, 2:11 PM  07/14/2014  [place] BingKen Cebert Dettmann, PT [phone]  (pager)

## 2014-07-14 NOTE — Plan of Care (Signed)
Problem: Phase I Progression Outcomes Goal: Pain controlled with appropriate interventions Outcome: Completed/Met Date Met:  07/14/14 Pt has scheduled Tylenol since pt unable to report pain due to moderate-severe dementia. PAINAD scores 0.

## 2014-07-14 NOTE — Plan of Care (Signed)
Problem: Phase II Progression Outcomes Goal: Vital signs remain stable Outcome: Completed/Met Date Met:  07/14/14     

## 2014-07-14 NOTE — Progress Notes (Signed)
Family Medicine Teaching Service Daily Progress Note Intern Pager: [phone]  [name] Medical record number: [id number] Date of birth: 02-17-1928 Age: 80 y.o. Gender: female  Primary Care Provider: Paulina FusiSCHULTZ,DOUGLAS E, Lewis Consultants: Cardiology Code Status: Full  Pt Overview and Major Events to Date:  07/13/14: Admitted  Assessment and Plan: Victoria HelperBetty C Lewis is a 80 y.o. female presenting with syncope. PMH is significant for previous syncopal episodes, HTN, CKD III, chronic cystitis, dementia, GERD, Anemia, Hiatal Hernia, and Arthritis.  # Syncope- two episodes of syncope with one near episode of syncope. Last episode occurred today after being given Victoria Lewis and attempting bowel movement. Has loop recorder in place and has been told that her "heart occasionally stops for a few seconds." Dose of metoprolol was recently doubled - Cardiology consulted due to possible cardiogenic etiology - Appreciate recommendations  - Loop recorder shows episodes of complete heart block during syncope  - Holding metoprolol. If no improvement will consider pacemaker placement. - EKG negative - Troponins negative. Will continue to monitor - CT Head- Advanced atrophy and extensive chronic microvascular changes. No acute intracranial abnormality.  #Constipation- soap suds enema today, monitor closely when giving enema  #Hyponatremia: Sodium 131 at admission - Improved to 134 today - Continue to monitor   # H/O UTI- [facility] Victoria Lewis - Prescribed Victoria Lewis 250mg  and instructed to take 0.5 tablets daily for 30 days - Victoria Lewis binds with iron and calcium, which Victoria Lewis is also taking  - Daughter-In-Law states she takes her Victoria Lewis at the same time as her iron and calcium  - Consider switching to fluoroquinolone to avoid binding - Note that daughter does not want catheter placed.   # HTN- Home  medication of Metoprolol, which is being held at this time - 136-170/ 72-102 while in ED - BP 125-150/58-75 this am  # CKD Stage III- Creatinine 1.09 at admission. Baseline 0.9-1. - Cr 1.16 today - Continue to monitor   # H/O Anemia- Hemoglobin 13.5 at admission and 12.8 today. - Continue Ferrous Sulfate 325mg  - CBC tomorrow morning  # GERD- Pantoprazole 40mg   FEN/GI: Heart Healthy/Carb Modified; NS@100  Prophylaxis: Subcutaneous Victoria Lewis  [facility]  Subjective:  No acute complaints overnight. Most of history taken from daughter-in-law due to demented state of Victoria Lewis. States she appears more fatigued and that the odor of her urine has changed. States this is usually indicative of a UTI in her mother. Has not had a bowel movement in a few days and would like to try an enema. No further complaints today.  Objective: Temp:  [97.4 F (36.3 C)-98.6 F (37 C)] 98.4 F (36.9 C) (11/05 0532) Pulse Rate:  [66-120] 84 (11/05 0752) Resp:  [13-27] 18 (11/05 0752) BP: (101-170)/(58-102) 150/61 mmHg (11/05 0752) SpO2:  [95 %-100 %] 98 % (11/05 0752) Weight:  [135 lb 11.2 oz (61.553 kg)] 135 lb 11.2 oz (61.553 kg) (11/05 0532) Physical Exam: General: 80yo female resting comfortably and in no apparent distress HEENT: dry mucous membranes. Cardiovascular: S1 and S2 noted; no murmurs/rubs/gallops. Respiratory: Clear to auscultation bilaterally. No wheezing/rales/rhonchi. No increased work of breathing. Abdomen: Bowel sounds noted. Soft and nondistended. Non-tender.  Extremities: No edema noted.  Laboratory:  Recent Labs Lab 07/13/14 1445 07/14/14 0630  WBC 8.4 6.4  HGB 13.5 12.8  HCT 40.2 38.1  PLT 347 354    Recent Labs Lab 07/13/14 1445 07/14/14 0630  NA 131* 134*  K 4.3 4.2  CL 93* 96  CO2 26 22  BUN 18 19  CREATININE 1.09 1.16*  CALCIUM 10.3 9.9  GLUCOSE 93 91   Imaging/Diagnostic Tests: Ct Head Wo  Contrast  07/13/2014   CLINICAL DATA:  Found passed out on the toilet. Reason UTI. History of syncopal episodes.  EXAM: CT HEAD WITHOUT CONTRAST  TECHNIQUE: Contiguous axial images were obtained from the base of the skull through the vertex without intravenous contrast.  COMPARISON:  11/29/2013  FINDINGS: There is diffuse cerebral atrophy. Severe chronic microvascular changes throughout the deep white matter. Associated ventriculomegaly of the lateral ventricles. No hemorrhage or hydrocephalus. No acute infarction or mass lesion. No midline shift.  Air-fluid levels noted in the sphenoid sinuses, similar to prior study. Mastoid air cells are clear.  IMPRESSION: Advanced atrophy and extensive chronic microvascular changes.  No acute intracranial abnormality.   Electronically Signed   By: Victoria Lewis  Victoria M.D.   On: 07/13/2014 13:13   Victoria Bouchealeigh N Rumley, DO 07/14/2014, 8:22 AM PGY-1, [place] [facility] FPTS Intern pager: [phone], text pages welcome

## 2014-07-14 NOTE — Progress Notes (Signed)
UR completed 

## 2014-07-14 NOTE — Progress Notes (Signed)
Patient ID: Victoria Lewis, female   DOB: March 28, 1928, 80 y.o.   MRN: [id number]    Subjective:  Denies SSCP, palpitations or Dyspnea Weak  Never recovered from long stay at [facility] with complicated UTI  Objective:  Filed Vitals:   07/14/14 0347 07/14/14 0409 07/14/14 0532 07/14/14 0752  BP: 128/58  128/58 150/61  Pulse: 112 112  84  Temp:   98.4 F (36.9 C)   TempSrc:   Oral Oral  Resp: 14 16 16 18   Weight:   61.553 kg (135 lb 11.2 oz)   SpO2: 98% 98%  98%    Intake/Output from previous day:  Intake/Output Summary (Last 24 hours) at 07/14/14 0943 Last data filed at 07/14/14 0805  Gross per 24 hour  Intake    540 ml  Output      0 ml  Net    540 ml    Physical Exam: Affect appropriate Healthy:  appears stated age HEENT: normal Neck supple with no adenopathy JVP normal no bruits no thyromegaly Lungs clear with no wheezing and good diaphragmatic motion Heart:  S1/S2 no murmur, no rub, gallop or click PMI normal Abdomen: benighn, BS positve, no tenderness, no AAA no bruit.  No HSM or HJR Distal pulses intact with no bruits No edema Neuro non-focal Skin warm and dry No muscular weakness   Lab Results: Basic Metabolic Panel:  Recent Labs  86/57/8409/12/22 1445 07/14/14 0630  NA 131* 134*  K 4.3 4.2  CL 93* 96  CO2 26 22  GLUCOSE 93 91  BUN 18 19  CREATININE 1.09 1.16*  CALCIUM 10.3 9.9   CBC:  Recent Labs  07/13/14 1445 07/14/14 0630  WBC 8.4 6.4  NEUTROABS 6.7  --   HGB 13.5 12.8  HCT 40.2 38.1  MCV 96.9 96.5  PLT 347 354   Cardiac Enzymes:  Recent Labs  07/13/14 1923 07/14/14 0012 07/14/14 0630  CKTOTAL 121  --   --   TROPONINI <0.30 <0.30 <0.30    Imaging: Ct Head Wo Contrast  07/13/2014   CLINICAL DATA:  Found passed out on the toilet. Reason UTI. History of syncopal episodes.  EXAM: CT HEAD WITHOUT CONTRAST  TECHNIQUE: Contiguous axial images were obtained from the base of the skull through the vertex without intravenous contrast.   COMPARISON:  11/29/2013  FINDINGS: There is diffuse cerebral atrophy. Severe chronic microvascular changes throughout the deep white matter. Associated ventriculomegaly of the lateral ventricles. No hemorrhage or hydrocephalus. No acute infarction or mass lesion. No midline shift.  Air-fluid levels noted in the sphenoid sinuses, similar to prior study. Mastoid air cells are clear.  IMPRESSION: Advanced atrophy and extensive chronic microvascular changes.  No acute intracranial abnormality.   Electronically Signed   By: Charlett NoseKevin  Dover M.D.   On: 07/13/2014 13:13    Cardiac Studies:  ECG:  SR rate 80 LAD PAC no AV block or BBB    Telemetry:  NSR rate 88 no heart block or long pauses   Echo:   Medications:   . acetaminophen  500 mg Oral QID  . acidophilus  1 capsule Oral Daily  . aspirin EC  81 mg Oral Daily  . calcium carbonate  1 tablet Oral BID WC  . cholecalciferol  2,000 Units Oral Daily  . ciprofloxacin  125 mg Oral Daily  . ferrous sulfate  325 mg Oral Daily  . heparin  5,000 Units Subcutaneous 3 times per day  . pantoprazole  40 mg Oral Daily  .  senna  1 tablet Oral BID  . sodium chloride  3 mL Intravenous Q12H  . sodium chloride  3 mL Intravenous Q12H  . vitamin B-12  1,000 mcg Oral Daily       Assessment/Plan:  CHB:  Doubt pacer will be needed.  ECG with no conduction abnormality  Avoid beta blockers Follow on telemetry 48 hrs HTN:  Beta blocker stopped  Add norvasc or hydralazine if needed UTI:  On cipro    Consider rehab stay  Victoria Lewis 07/14/2014, 9:43 AM

## 2014-07-14 NOTE — Plan of Care (Signed)
Problem: Phase II Progression Outcomes Goal: IV changed to normal saline lock Outcome: Completed/Met Date Met:  07/14/14     

## 2014-07-14 NOTE — Plan of Care (Signed)
Problem: Phase I Progression Outcomes Goal: Voiding-avoid urinary catheter unless indicated Outcome: Completed/Met Date Met:  07/14/14 Pt has hx of UTI so Foley catheter was avoided due to family request. Pt has incontinence.

## 2014-07-14 NOTE — Plan of Care (Signed)
Problem: Discharge Progression Outcomes Goal: Pain controlled with appropriate interventions Outcome: Completed/Met Date Met:  07/14/14

## 2014-07-14 NOTE — Plan of Care (Signed)
Problem: Phase III Progression Outcomes Goal: Pain controlled on oral analgesia Outcome: Completed/Met Date Met:  07/14/14

## 2014-07-14 NOTE — Progress Notes (Signed)
Occupational Therapy Evaluation Patient Details Name: Victoria Lewis MRN: [id number] DOB: February 16, 1928 Today's Date: 07/14/2014    History of Present Illness 80 y.o. female presenting with syncope. PMH is significant for previous syncopal episodes, HTN, CKD III, chronic cystitis, dementia, GERD, Anemia, Hiatal Hernia, and Arthritis. [facility]    Clinical Impression   At baseline, pt able to ambulate short distance with RW with min guard assist from family. Mod assist with bed mobility and Mod - Max A with ADL. Feel pt demonstrates a significant functional decline as compared to her baseline. Required total A +2 with transfer to chair. Lengthy discussion with daughter who wants to take pt home with home health services. Discussed need for 24/7 physical assist aside from home health services and concern that husband will not be able to provide this level of care. Discussed need to use an outside company to provide caregivers to assist with care. Will further assess with use of lift equipment to see if this is an option family can use to decrease burden of care. Discussed with nursing. Feel it is appropriate to recommend palliative care consult to discuss goals of care and provide emotional support for family.     Follow Up Recommendations  Home health OT;HH Aide;Supervision/Assistance - 24 hour (SNF recommended but family refusing SNF)    Equipment Recommendations  Other (comment) (lift equipment)    Recommendations for Other Services  Palliative care consult     Precautions / Restrictions Precautions Precautions: Fall Precaution Comments: syncopal episodes      Mobility Bed Mobility Overal bed mobility: +2 for physical assistance;Needs Assistance Bed Mobility: Supine to Sit     Supine to sit: Total assist;+2 for physical assistance     General bed mobility comments: Pt reaching with arms to rails with mod A but not  initiatingmovement or ehlping to rol  Transfers Overall transfer level: Needs assistance Equipment used: 2 person hand held assist Transfers: Sit to/from UGI CorporationStand;Stand Pivot Transfers Sit to Stand: Total assist;Max assist Stand pivot transfers: Total assist;+2 physical assistance       General transfer comment: Ataxic gait pattern. Support for R ankle to prevent inversion    Balance Overall balance assessment: Needs assistance Sitting-balance support: Feet supported Sitting balance-Leahy Scale: Poor     Standing balance support: During functional activity;Bilateral upper extremity supported Standing balance-Leahy Scale: Zero Standing balance comment: significant posterior lean                            ADL Overall ADL's : Needs assistance/impaired Eating/Feeding: Moderate assistance;Sitting Eating/Feeding Details (indicate cue type and reason): family assisting with feeding to increase PO intake Grooming: Moderate assistance;Sitting   Upper Body Bathing: Maximal assistance   Lower Body Bathing: Total assistance   Upper Body Dressing : Maximal assistance   Lower Body Dressing: Total assistance       Toileting- Clothing Manipulation and Hygiene: Total assistance Toileting - Clothing Manipulation Details (indicate cue type and reason): Total A at bed level. total A with rolling     Functional mobility during ADLs: +2 for physical assistance;Total assistance;Cueing for safety;Cueing for sequencing (signifiicant posterior lean) General ADL Comments: significant functional decline with mobility as compared to baseline     Vision                     Perception     Praxis  Pertinent Vitals/Pain Pain Assessment: Faces Faces Pain Scale: Hurts little more Pain Descriptors / Indicators: Discomfort;Grimacing Pain Intervention(s): Limited activity within patient's tolerance;Monitored during session;Repositioned     Hand Dominance      Extremity/Trunk Assessment Upper Extremity Assessment Upper Extremity Assessment: Generalized weakness   Lower Extremity Assessment Lower Extremity Assessment: Defer to PT evaluation (BLE stiffness at hips/knees. R LE ankle weakness/inverts)   Cervical / Trunk Assessment Cervical / Trunk Assessment: Kyphotic;Other exceptions (history of scoliosis)   Communication Communication Communication: HOH   Cognition Arousal/Alertness: Awake/alert Behavior During Therapy: Flat affect Overall Cognitive Status: History of cognitive impairments - at baseline                     General Comments       Exercises       Shoulder Instructions      Home Living Family/patient expects to be discharged to:: Private residence Living Arrangements: Spouse/significant other;Children Available Help at Discharge: Available 24 hours/day;Family Type of Home: House Home Access: Ramped entrance     Home Layout: One level     Bathroom Shower/Tub: Producer, television/film/videoWalk-in shower   Bathroom Toilet: Handicapped height Bathroom Accessibility: Yes How Accessible: Accessible via wheelchair Home Equipment: Walker - 2 wheels;Bedside commode;Shower seat;Grab bars - toilet;Grab bars - tub/shower;Transport chair;Toilet riser          Prior Functioning/Environment Level of Independence: Needs assistance  Gait / Transfers Assistance Needed: Family assisted with bed mobility and trasnfers @ min A. Pt ambulated with RW. mostly sat during the day. ADL's / Homemaking Assistance Needed: Pt able to feed sefl and assist minimally with bathing dressing.  Communication / Swallowing Assistance Needed: HOH; wears hearing aids      OT Diagnosis: Generalized weakness;Cognitive deficits;Acute pain;Ataxia   OT Problem List: Decreased strength;Decreased activity tolerance;Decreased range of motion;Impaired balance (sitting and/or standing);Decreased coordination;Decreased cognition;Decreased safety awareness;Decreased knowledge  of use of DME or AE;Cardiopulmonary status limiting activity;Impaired tone;Pain   OT Treatment/Interventions: Self-care/ADL training;Therapeutic exercise;DME and/or AE instruction;Therapeutic activities;Cognitive remediation/compensation;Patient/family education;Balance training    OT Goals(Current goals can be found in the care plan section) Acute Rehab OT Goals Patient Stated Goal: family states to return home OT Goal Formulation: With family Time For Goal Achievement: 07/28/14 Potential to Achieve Goals: Good  OT Frequency: Min 2X/week   Barriers to D/C:            Co-evaluation PT/OT/SLP Co-Evaluation/Treatment: Yes Reason for Co-Treatment: For patient/therapist safety          End of Session Equipment Utilized During Treatment: Gait belt Nurse Communication: Mobility status;Need for lift equipment;Other (comment)  Activity Tolerance: Patient tolerated treatment well Patient left: in chair;with call bell/phone within reach;with family/visitor present   Time: [id number] OT Time Calculation (min): 43 min Charges:  OT General Charges $OT Visit: 1 Procedure OT Evaluation $Initial OT Evaluation Tier I: 1 Procedure OT Treatments $Self Care/Home Management : 23-37 mins G-Codes:    Jerrie Schussler,HILLARY 07/14/2014, 12:32 PM   [facility] Shaida Route, OTR/L  [phone] 07/14/2014

## 2014-07-14 NOTE — Plan of Care (Signed)
Problem: Discharge Progression Outcomes Goal: Tolerating diet Outcome: Completed/Met Date Met:  07/14/14     

## 2014-07-15 LAB — BASIC METABOLIC PANEL
Anion gap: 16 — ABNORMAL HIGH (ref 5–15)
BUN: 16 mg/dL (ref 6–23)
CHLORIDE: 100 meq/L (ref 96–112)
CO2: 18 mEq/L — ABNORMAL LOW (ref 19–32)
CREATININE: 0.95 mg/dL (ref 0.50–1.10)
Calcium: 8.8 mg/dL (ref 8.4–10.5)
GFR calc Af Amer: 62 mL/min — ABNORMAL LOW (ref >90)
GFR calc non Af Amer: 53 mL/min — ABNORMAL LOW (ref >90)
GLUCOSE: 138 mg/dL — AB (ref 70–99)
POTASSIUM: 4.5 meq/L (ref 3.7–5.3)
Sodium: 134 mEq/L — ABNORMAL LOW (ref 137–147)

## 2014-07-15 LAB — MRSA PCR SCREENING
MRSA BY PCR: NEGATIVE
MRSA by PCR: NEGATIVE

## 2014-07-15 MED ORDER — CETYLPYRIDINIUM CHLORIDE 0.05 % MT LIQD
7.0000 mL | Freq: Two times a day (BID) | OROMUCOSAL | Status: DC
  Administered 2014-07-16 – 2014-07-18 (×3): 7 mL via OROMUCOSAL

## 2014-07-15 MED ORDER — INFLUENZA VAC SPLIT QUAD 0.5 ML IM SUSY
0.5000 mL | PREFILLED_SYRINGE | INTRAMUSCULAR | Status: DC
  Filled 2014-07-15: qty 0.5

## 2014-07-15 MED ORDER — AMLODIPINE BESYLATE 5 MG PO TABS
5.0000 mg | ORAL_TABLET | Freq: Every day | ORAL | Status: DC
  Administered 2014-07-15: 5 mg via ORAL
  Filled 2014-07-15 (×2): qty 1

## 2014-07-15 MED ORDER — MAGIC MOUTHWASH
15.0000 mL | Freq: Four times a day (QID) | ORAL | Status: DC | PRN
  Administered 2014-07-16 – 2014-07-17 (×2): 15 mL via ORAL
  Filled 2014-07-15 (×3): qty 15

## 2014-07-15 NOTE — Progress Notes (Signed)
Patient ID: Victoria Lewis, female   DOB: 08-01-1928, 80 y.o.   MRN: [id number]    Subjective:  Denies SSCP, palpitations or Dyspnea Hard of hearing   Objective:  Filed Vitals:   07/14/14 0752 07/14/14 1344 07/14/14 2006 07/15/14 0437  BP: 150/61 148/72 164/65 185/83  Pulse: 84 88 81 75  Temp:  98.7 F (37.1 C) 98.6 F (37 C)   TempSrc: Oral Oral Oral   Resp: 18 19 18 16   Weight:    61.398 kg (135 lb 5.7 oz)  SpO2: 98% 96% 98%     Intake/Output from previous day:  Intake/Output Summary (Last 24 hours) at 07/15/14 [id number] Last data filed at 07/14/14 1800  Gross per 24 hour  Intake    720 ml  Output      0 ml  Net    720 ml    Physical Exam: Affect appropriate Healthy:  appears stated age HEENT: normal Neck supple with no adenopathy JVP normal no bruits no thyromegaly Lungs clear with no wheezing and good diaphragmatic motion Heart:  S1/S2 no murmur, no rub, gallop or click PMI normal Abdomen: benighn, BS positve, no tenderness, no AAA no bruit.  No HSM or HJR Distal pulses intact with no bruits No edema Neuro non-focal Skin warm and dry No muscular weakness   Lab Results: Basic Metabolic Panel:  Recent Labs  11/91/4709/12/22 1445 07/14/14 0630  NA 131* 134*  K 4.3 4.2  CL 93* 96  CO2 26 22  GLUCOSE 93 91  BUN 18 19  CREATININE 1.09 1.16*  CALCIUM 10.3 9.9   CBC:  Recent Labs  07/13/14 1445 07/14/14 0630  WBC 8.4 6.4  NEUTROABS 6.7  --   HGB 13.5 12.8  HCT 40.2 38.1  MCV 96.9 96.5  PLT 347 354   Cardiac Enzymes:  Recent Labs  07/13/14 1923 07/14/14 0012 07/14/14 0630  CKTOTAL 121  --   --   TROPONINI <0.30 <0.30 <0.30    Imaging: Ct Head Wo Contrast  07/13/2014   CLINICAL DATA:  Found passed out on the toilet. Reason UTI. History of syncopal episodes.  EXAM: CT HEAD WITHOUT CONTRAST  TECHNIQUE: Contiguous axial images were obtained from the base of the skull through the vertex without intravenous contrast.  COMPARISON:  11/29/2013   FINDINGS: There is diffuse cerebral atrophy. Severe chronic microvascular changes throughout the deep white matter. Associated ventriculomegaly of the lateral ventricles. No hemorrhage or hydrocephalus. No acute infarction or mass lesion. No midline shift.  Air-fluid levels noted in the sphenoid sinuses, similar to prior study. Mastoid air cells are clear.  IMPRESSION: Advanced atrophy and extensive chronic microvascular changes.  No acute intracranial abnormality.   Electronically Signed   By: Charlett NoseKevin  Dover M.D.   On: 07/13/2014 13:13    Cardiac Studies:  ECG:  SR rate 80 LAD PAC no AV block or BBB    Telemetry:  NSR rate 88 no heart block or long pauses   Echo:   Medications:   . acetaminophen  500 mg Oral QID  . acidophilus  1 capsule Oral Daily  . aspirin EC  81 mg Oral Daily  . cholecalciferol  2,000 Units Oral Daily  . ciprofloxacin  250 mg Oral Daily  . heparin  5,000 Units Subcutaneous 3 times per day  . pantoprazole  40 mg Oral Daily  . senna  1 tablet Oral BID  . sodium chloride  3 mL Intravenous Q12H  . sodium chloride  3 mL  Intravenous Q12H  . vitamin B-12  1,000 mcg Oral Daily     . sodium chloride 100 mL/hr at 07/15/14 0734    Assessment/Plan:  CHB:  Doubt pacer will be needed.  ECG with no conduction abnormality  Avoid beta blockers Follow on telemetry 24hrs  HTN:  Beta blocker stopped  Add norvasc for elevated BP  UTI:  On cipro    Consider rehab stay  Victoria Lewis 07/15/2014, 8:52 AM

## 2014-07-15 NOTE — Plan of Care (Signed)
Problem: Phase II Progression Outcomes Goal: Progress activity as tolerated unless otherwise ordered Outcome: Progressing     

## 2014-07-15 NOTE — Progress Notes (Signed)
Occupational Therapy Treatment Patient Details Name: Victoria Lewis MRN: [id number] DOB: [birth year] Today's Date: 07/15/2014    History of present illness 80 y.o. female presenting with syncope. PMH is significant for previous syncopal episodes, HTN, CKD III, chronic cystitis, dementia, GERD, Anemia, Hiatal Hernia, and Arthritis. [facility]    OT comments  Pt. Son was present for training session on steady lift. Pt. Son instructed on both types of seats available and how to operate brakes on device. Instructed son on how to position pt. In sitting position prior to placing lift in front of pt. Instructed son on how pt. To perform sit to stand and stand to sit. Showed pt. Son how to sit pt. Down on seat prior to moving steady to another location. Pt. Son wants sister who is primary caregiver to see and be ed. On  the device.   Follow Up Recommendations  Supervision/Assistance - 24 hour    Equipment Recommendations       Recommendations for Other Services      Precautions / Restrictions Precautions Precautions: Fall Precaution Comments: syncopal episodes       Mobility Bed Mobility                  Transfers                      Balance                                   ADL                                                Vision                     Perception     Praxis      Cognition   Behavior During Therapy: WFL for tasks assessed/performed Overall Cognitive Status: History of cognitive impairments - at baseline                       Extremity/Trunk Assessment               Exercises     Shoulder Instructions       General Comments      Pertinent Vitals/ Pain       Pain Assessment: No/denies pain  Home Living                                          Prior Functioning/Environment             Frequency Min 2X/week     Progress Toward Goals  OT Goals(current goals can now be found in the care plan section)  Progress towards OT goals: Progressing toward goals     Plan      Co-evaluation                 End of Session     Activity Tolerance Patient tolerated treatment well   Patient Left     Nurse Communication  Time: 1308-6578: 1044-1122 OT Time Calculation (min): 38 min  Charges: OT General Charges $OT Visit: 1 Procedure OT Treatments $Self Care/Home Management : 38-52 mins  Curry Dulski 07/15/2014, 11:50 AM

## 2014-07-15 NOTE — Progress Notes (Signed)
Family Medicine Teaching Service Daily Progress Note Intern Pager: [phone]  [name] Medical record number: [id number] Date of birth: Aug 22, 1928 Age: 80 y.o. Gender: female  Primary Care Provider: Paulina FusiSCHULTZ,DOUGLAS E, MD Consultants: Cardiology Code Status: Full  Pt Overview and Major Events to Date:  07/13/14: Admitted  Assessment and Plan: Victoria Lewis is a 80 y.o. female presenting with syncope. PMH is significant for previous syncopal episodes, HTN, CKD III, chronic cystitis, dementia, GERD, Anemia, Hiatal Hernia, and Arthritis.  # Syncope- two episodes of syncope with one near episode of syncope. Last episode occurred today after being given Dulculax and attempting bowel movement. Has loop recorder in place and has been told that her "heart occasionally stops for a few seconds." Dose of metoprolol was recently doubled - Cardiology consulted due to possible cardiogenic etiology - Appreciate recommendations  - Loop recorder shows episodes of complete heart block during syncope  - Holding metoprolol. If no improvement will consider pacemaker placement.  - Monitor for 24hr - EKG negative - Troponins negative. Will continue to monitor - CT Head- Advanced atrophy and extensive chronic microvascular changes. No acute intracranial abnormality.  # [facility]O [facility] - Ciprofloxacin dose increased from 125mg  to 250mg  - Discontinue iron and calcium due to binding of ciprofloxacin - Note that daughter does not want catheter placed.   # HTN- Home medication of Metoprolol, which is being held at this time - 136-170/ 72-102 while in ED - BP 148-185/65-83 in past 24hr - 150s/70s when in room - Will decrease fluid rate from 12000ml/hr to 8875ml/hr - Cardiology recommendation: Add norvasc 5mg  for elevated BP  # CKD Stage III- Creatinine 1.09 at admission. Baseline 0.9-1. - Cr 1.16 today - Continue to monitor    FEN/GI: Heart Healthy/Carb Modified; NS@75  Prophylaxis: Subcutaneous Heparin  [facility] -PT/OT recommends discharging to SNF, however family refuses.  Subjective:  No acute complaints overnight. States she is feeling much better today. Had bowel movement yesterday. No further complaints today.  Objective: Temp:  [98.6 F (37 C)-98.7 F (37.1 C)] 98.6 F (37 C) (11/05 2006) Pulse Rate:  [75-88] 75 (11/06 0437) Resp:  [16-19] 16 (11/06 0437) BP: (148-185)/(65-83) 185/83 mmHg (11/06 0437) SpO2:  [96 %-98 %] 98 % (11/05 2006) Weight:  [135 lb 5.7 oz (61.398 kg)] 135 lb 5.7 oz (61.398 kg) (11/06 0437) Physical Exam: General: 80yo female resting comfortably and in no apparent distress HEENT: dry mucous membranes. Cardiovascular: S1 and S2 noted; no murmurs/rubs/gallops. Respiratory: Clear to auscultation bilaterally. No wheezing/rales/rhonchi. No increased work of breathing. Abdomen: Bowel sounds noted. Soft and nondistended. Non-tender.  Extremities: No edema noted.  Laboratory:  Recent Labs Lab 07/13/14 1445 07/14/14 0630  WBC 8.4 6.4  HGB 13.5 12.8  HCT 40.2 38.1  PLT 347 354    Recent Labs Lab 07/13/14 1445 07/14/14 0630  NA 131* 134*  K 4.3 4.2  CL 93* 96  CO2 26 22  BUN 18 19  CREATININE 1.09 1.16*  CALCIUM 10.3 9.9  GLUCOSE 93 91   Imaging/Diagnostic Tests: No results found. [address] La VillitaRumley, [place] 07/15/2014, 8:26 AM PGY-1, [facility] FPTS Intern pager: [phone], text pages welcome

## 2014-07-15 NOTE — Care Management Note (Addendum)
    Page 1 of 2   07/18/2014     2:05:03 PM CARE MANAGEMENT NOTE 07/18/2014  Patient:  Victoria Lewis,Victoria Lewis   Account Number:  [id number]  Date Initiated:  07/15/2014  Documentation initiated by:  GRAVES-BIGELOW,Johari Bennetts  Subjective/Objective Assessment:   Pt admitted for syncope. Pt is from home with husband. Plan to return home once stable. Pt has DME transfer chair and RW. No DME needs at this time.     Action/Plan:   CM did speak to daughter in ref to disposition needs and [facility]. Referral made and SOC to begin within 24-48 hrs post d/Lewis.   Anticipated DC Date:  07/16/2014   Anticipated DC Plan:  HOME W HOME HEALTH SERVICES      DC Planning Services  CM consult      [facility]   Choice offered to / List presented to:  Lewis-4 Adult Children        HH arranged  HH-1 RN  HH-10 DISEASE MANAGEMENT  HH-2 PT  HH-3 OT  HH-4 NURSE'S AIDE      HH agency  [facility]   Status of service:  Completed, signed off Medicare Important Message given?  YES (If response is "NO", the following Medicare IM given date fields will be blank) Date Medicare IM given:  07/15/2014 Medicare IM given by:  GRAVES-BIGELOW,Zeyad Delaguila Date Additional Medicare IM given:  07/18/2014 Additional Medicare IM given by:  Dore Oquin GRAVES-BIGELOW  Discharge Disposition:  HOME W HOME HEALTH SERVICES  Per UR Regulation:  Reviewed for med. necessity/level of care/duration of stay  If discussed at Long Length of Stay Meetings, dates discussed:   07/19/2014    Comments:  07-18-14 1402 Tomi BambergerBrenda Graves-Bigelow, RN,BSN [phone] Cardiology signed off- [facility].

## 2014-07-16 ENCOUNTER — Inpatient Hospital Stay (HOSPITAL_COMMUNITY): Payer: Medicare Other

## 2014-07-16 DIAGNOSIS — N3020 Other chronic cystitis without hematuria: Secondary | ICD-10-CM

## 2014-07-16 DIAGNOSIS — R000 Tachycardia, unspecified: Secondary | ICD-10-CM | POA: Insufficient documentation

## 2014-07-16 LAB — URINALYSIS, ROUTINE W REFLEX MICROSCOPIC
Bilirubin Urine: NEGATIVE
Glucose, UA: NEGATIVE mg/dL
HGB URINE DIPSTICK: NEGATIVE
Ketones, ur: NEGATIVE mg/dL
Leukocytes, UA: NEGATIVE
Nitrite: NEGATIVE
PROTEIN: NEGATIVE mg/dL
Specific Gravity, Urine: 1.009 (ref 1.005–1.030)
UROBILINOGEN UA: 0.2 mg/dL (ref 0.0–1.0)
pH: 6.5 (ref 5.0–8.0)

## 2014-07-16 LAB — CBC
HEMATOCRIT: 35.9 % — AB (ref 36.0–46.0)
Hemoglobin: 12.2 g/dL (ref 12.0–15.0)
MCH: 32.2 pg (ref 26.0–34.0)
MCHC: 34 g/dL (ref 30.0–36.0)
MCV: 94.7 fL (ref 78.0–100.0)
PLATELETS: 310 10*3/uL (ref 150–400)
RBC: 3.79 MIL/uL — ABNORMAL LOW (ref 3.87–5.11)
RDW: 13.5 % (ref 11.5–15.5)
WBC: 5.8 10*3/uL (ref 4.0–10.5)

## 2014-07-16 LAB — TROPONIN I

## 2014-07-16 MED ORDER — VANCOMYCIN HCL IN DEXTROSE 1-5 GM/200ML-% IV SOLN
1000.0000 mg | INTRAVENOUS | Status: DC
  Administered 2014-07-16 – 2014-07-17 (×2): 1000 mg via INTRAVENOUS
  Filled 2014-07-16 (×3): qty 200

## 2014-07-16 MED ORDER — PIPERACILLIN-TAZOBACTAM 3.375 G IVPB
3.3750 g | Freq: Three times a day (TID) | INTRAVENOUS | Status: DC
  Administered 2014-07-16 – 2014-07-18 (×6): 3.375 g via INTRAVENOUS
  Filled 2014-07-16 (×7): qty 50

## 2014-07-16 MED ORDER — AMLODIPINE BESYLATE 10 MG PO TABS
10.0000 mg | ORAL_TABLET | Freq: Every day | ORAL | Status: DC
  Administered 2014-07-16 – 2014-07-18 (×3): 10 mg via ORAL
  Filled 2014-07-16: qty 1
  Filled 2014-07-16: qty 2
  Filled 2014-07-16: qty 1

## 2014-07-16 MED ORDER — GLYCERIN (LAXATIVE) 2.1 G RE SUPP
1.0000 | Freq: Once | RECTAL | Status: AC
  Administered 2014-07-16: 1 via RECTAL
  Filled 2014-07-16: qty 1

## 2014-07-16 NOTE — Progress Notes (Signed)
Family Medicine Teaching Service Daily Progress Note Intern Pager: [phone]  [name] Medical record number: [id number] Date of birth: Jun 07, 1928 Age: 80 y.o. Gender: female  Primary Care Provider: Paulina FusiSCHULTZ,DOUGLAS E, MD Consultants: Cardiology Code Status: Full  Pt Overview and Major Events to Date:  07/13/14: Admitted  Assessment and Plan: Victoria Lewis is a 80 y.o. female presenting with syncope. PMH is significant for previous syncopal episodes, HTN, CKD III, chronic cystitis, dementia, GERD, Anemia, Hiatal Hernia, and Arthritis.  # Syncope- two episodes of syncope with one near episode of syncope. - Likely cardiogenic- Has loop recorder in place and has been told that her "heart occasionally stops for a few seconds." Dose of metoprolol was recently doubled - Cardiology consulted - appreciate recommnedations - Appreciate recommendations  - Loop recorder shows episodes of complete heart block during syncope  - Holding metoprolol and no more episodes of CHB - EKG/troponins negative - CT Head- Advanced atrophy and extensive chronic microvascular changes. No acute intracranial abnormality.  # H/O UTI- [facility] - Ciprofloxacin dose increased from 125mg  to 250mg  - Discontinue iron and calcium due to binding of ciprofloxacin - Note that daughter does not want catheter placed.  - Day 2 of Tx at 250 mg BID  # HTN- Home medication of Metoprolol, which is being held at this time - 153/79 currently - BP 130-175/58-92 in past 24hr - 150s/70s when in room - NS at 7075ml/hr - Cardiology recommends appreciate recommendations  - Increase norvasc to 10, possibly add hydralazine   # CKD Stage III- Creatinine 1.09 at admission. Baseline 0.9-1. - Cr 1.16 today - Continue to monitor   FEN/GI: Heart Healthy/Carb Modified; NS@75  Prophylaxis: Subcutaneous Heparin  [facility]: [facility], continue to monitor HR, BP, and MS.  -PT/OT recommends discharging to SNF, however family refuses.  Subjective:  No acute complaints overnight.family member at bedside states she seems more somnolent than yesterday.   Objective: Temp:  [97.6 F (36.4 C)-98.8 F (37.1 C)] 97.6 F (36.4 C) (11/07 0527) Pulse Rate:  [91-129] 128 (11/07 0527) Resp:  [15-23] 21 (11/07 0527) BP: (130-175)/(58-92) 153/79 mmHg (11/07 0931) SpO2:  [96 %] 96 % (11/07 0527) Weight:  [135 lb (61.236 kg)] 135 lb (61.236 kg) (11/07 0527) Physical Exam: General: 80yo female resting comfortably and in no apparent distress HEENT: NCAT Cardiovascular: S1 and S2; no murmurs/rubs/gallops. RRR Respiratory: Clear to auscultation bilaterally. No wheezing/rales/rhonchi. No increased work of breathing. Abdomen: Bowel sounds noted. Soft and nondistended. Non-tender.  Extremities: No edema noted. Neuro- A&O to person and city, not to time or hospital  Laboratory:  Recent Labs Lab 07/13/14 1445 07/14/14 0630  WBC 8.4 6.4  HGB 13.5 12.8  HCT 40.2 38.1  PLT 347 354    Recent Labs Lab 07/13/14 1445 07/14/14 0630 07/15/14 0905  NA 131* 134* 134*  K 4.3 4.2 4.5  CL 93* 96 100  CO2 26 22 18*  BUN 18 19 16   CREATININE 1.09 1.16* 0.95  CALCIUM 10.3 9.9 8.8  GLUCOSE 93 91 138*   Imaging/Diagnostic Tests: No results found.    Elenora GammaSamuel L Bradshaw, MD 07/16/2014, 11:37 AM PGY-3, [facility] FPTS Intern pager: [phone], text pages welcome

## 2014-07-16 NOTE — Progress Notes (Signed)
ANTIBIOTIC CONSULT NOTE - INITIAL  Pharmacy Consult for vancomycin and zosyn Indication: sepsis  No Known Allergies  Patient Measurements: Height: 5\' 5"  (165.1 cm) Weight: 135 lb (61.236 kg) IBW/kg (Calculated) : 57 Adjusted Body Weight:   Vital Signs: Temp: 98.7 F (37.1 C) (11/07 1405) Temp Source: Oral (11/07 1405) BP: 160/81 mmHg (11/07 1405) Pulse Rate: 120 (11/07 1405) Intake/Output from previous day: 11/06 0701 - 11/07 0700 In: 243 [P.O.:240; I.V.:3] Out: -  Intake/Output from this shift:    Labs:  Recent Labs  07/14/14 0630 07/15/14 0905  WBC 6.4  --   HGB 12.8  --   PLT 354  --   CREATININE 1.16* 0.95   Estimated Creatinine Clearance: 39.7 mL/min (by C-G formula based on Cr of 0.95). No results for input(s): VANCOTROUGH, VANCOPEAK, VANCORANDOM, GENTTROUGH, GENTPEAK, GENTRANDOM, TOBRATROUGH, TOBRAPEAK, TOBRARND, AMIKACINPEAK, AMIKACINTROU, AMIKACIN in the last 72 hours.   Microbiology: Recent Results (from the past 720 hour(s))  MRSA PCR Screening     Status: None   Collection Time: 07/15/14  5:33 AM  Result Value Ref Range Status   MRSA by PCR NEGATIVE NEGATIVE Final    Comment:        The GeneXpert MRSA Assay (FDA approved for NASAL specimens only), is one component of a comprehensive MRSA colonization surveillance program. It is not intended to diagnose MRSA infection nor to guide or monitor treatment for MRSA infections.   MRSA PCR Screening     Status: None   Collection Time: 07/15/14  6:06 PM  Result Value Ref Range Status   MRSA by PCR NEGATIVE NEGATIVE Final    Comment:        The GeneXpert MRSA Assay (FDA approved for NASAL specimens only), is one component of a comprehensive MRSA colonization surveillance program. It is not intended to diagnose MRSA infection nor to guide or monitor treatment for MRSA infections.     Medical History: Past Medical History  Diagnosis Date  . Hypertension   . GERD (gastroesophageal reflux  disease)   . H/O hiatal hernia   . Arthritis   . Anemia     iron  . Syncope 07/13/2014  . UTI (lower urinary tract infection)     Medications:  Scheduled:  . acetaminophen  500 mg Oral QID  . acidophilus  1 capsule Oral Daily  . amLODipine  10 mg Oral Daily  . antiseptic oral rinse  7 mL Mouth Rinse BID  . aspirin EC  81 mg Oral Daily  . cholecalciferol  2,000 Units Oral Daily  . heparin  5,000 Units Subcutaneous 3 times per day  . Influenza vac split quadrivalent PF  0.5 mL Intramuscular Tomorrow-1000  . pantoprazole  40 mg Oral Daily  . senna  1 tablet Oral BID  . sodium chloride  3 mL Intravenous Q12H  . sodium chloride  3 mL Intravenous Q12H  . vitamin B-12  1,000 mcg Oral Daily   Infusions:  . sodium chloride 75 mL (07/16/14 1028)   Assessment: 80 yo female with sepsis will be started on vancomycin and zosyn.  SCr 0.95 (CrCl ~39.7)  Goal of Therapy:  Vancomycin trough level 15-20 mcg/ml  Plan:  - zosyn 3.375g iv q8h (4hr infusion) - vancomycin 1g iv q24h - follow up on antibiotic plan before checking vancomycin trough - monitor cx and sensitivity  Lyndsie Wallman, Tsz-Yin 07/16/2014,3:57 PM

## 2014-07-16 NOTE — Progress Notes (Signed)
Patient ID: [name], female   DOB: Mar 21, 1928, 80 y.o.   MRN: [id number]    Subjective:  Denies SSCP, palpitations or Dyspnea Getting bathed  Objective:  Filed Vitals:   07/15/14 2044 07/16/14 0037 07/16/14 0237 07/16/14 0527  BP: 166/84 136/75 135/79 175/92  Pulse: 129   128  Temp: 98.3 F (36.8 C)   97.6 F (36.4 C)  TempSrc: Oral   Oral  Resp: 20 23 15 21   Weight:    61.236 kg (135 lb)  SpO2:    96%    Intake/Output from previous day:  Intake/Output Summary (Last 24 hours) at 07/16/14 0901 Last data filed at 07/15/14 1121  Gross per 24 hour  Intake      3 ml  Output      0 ml  Net      3 ml    Physical Exam: Affect appropriate Healthy:  appears stated age HEENT: normal Neck supple with no adenopathy JVP normal no bruits no thyromegaly Lungs clear with no wheezing and good diaphragmatic motion Heart:  S1/S2 no murmur, no rub, gallop or click PMI normal Abdomen: benighn, BS positve, no tenderness, no AAA no bruit.  No HSM or HJR Distal pulses intact with no bruits No edema Neuro non-focal Skin warm and dry No muscular weakness   Lab Results: Basic Metabolic Panel:  Recent Labs  78/29/5610/01/21 0630 07/15/14 0905  NA 134* 134*  K 4.2 4.5  CL 96 100  CO2 22 18*  GLUCOSE 91 138*  BUN 19 16  CREATININE 1.16* 0.95  CALCIUM 9.9 8.8   CBC:  Recent Labs  07/13/14 1445 07/14/14 0630  WBC 8.4 6.4  NEUTROABS 6.7  --   HGB 13.5 12.8  HCT 40.2 38.1  MCV 96.9 96.5  PLT 347 354   Cardiac Enzymes:  Recent Labs  07/13/14 1923 07/14/14 0012 07/14/14 0630  CKTOTAL 121  --   --   TROPONINI <0.30 <0.30 <0.30    Imaging: No results found.  Cardiac Studies:  ECG:  SR rate 80 LAD PAC no AV block or BBB    Telemetry:  NSR rate 88 no heart block or long pauses   Echo:   Medications:   . acetaminophen  500 mg Oral QID  . acidophilus  1 capsule Oral Daily  . amLODipine  5 mg Oral Daily  . antiseptic oral rinse  7 mL Mouth Rinse BID  .  aspirin EC  81 mg Oral Daily  . cholecalciferol  2,000 Units Oral Daily  . ciprofloxacin  250 mg Oral Daily  . heparin  5,000 Units Subcutaneous 3 times per day  . Influenza vac split quadrivalent PF  0.5 mL Intramuscular Tomorrow-1000  . pantoprazole  40 mg Oral Daily  . senna  1 tablet Oral BID  . sodium chloride  3 mL Intravenous Q12H  . sodium chloride  3 mL Intravenous Q12H  . vitamin B-12  1,000 mcg Oral Daily     . sodium chloride 75 mL/hr at 07/15/14 2220    Assessment/Plan:  CHB:  Resolved no need for pacer  Telemetry with SR and Sinus tachycardia   HTN:  Beta blocker stopped  Increase norvasc to 10 mg and consider adding hydralazine  UTI:  On cipro    Consider rehab stay  Victoria Lewis 07/16/2014, 9:01 AM

## 2014-07-16 NOTE — Progress Notes (Signed)
Dr. Mayford Knifeurner notified of elevated HR (115-120 bpm).  No new orders received.  Will continue to monitor.  Victoria Lewis, Victoria Lewis

## 2014-07-17 ENCOUNTER — Inpatient Hospital Stay (HOSPITAL_COMMUNITY): Payer: Medicare Other

## 2014-07-17 ENCOUNTER — Encounter (HOSPITAL_COMMUNITY): Payer: Self-pay | Admitting: Radiology

## 2014-07-17 LAB — APTT

## 2014-07-17 LAB — PROTIME-INR
INR: 1.14 (ref 0.00–1.49)
Prothrombin Time: 14.7 seconds (ref 11.6–15.2)

## 2014-07-17 LAB — TSH: TSH: 2.9 u[IU]/mL (ref 0.350–4.500)

## 2014-07-17 LAB — D-DIMER, QUANTITATIVE: D-Dimer, Quant: 1.66 ug/mL-FEU — ABNORMAL HIGH (ref 0.00–0.48)

## 2014-07-17 MED ORDER — POLYETHYLENE GLYCOL 3350 17 G PO PACK
17.0000 g | PACK | Freq: Every day | ORAL | Status: DC
  Administered 2014-07-17 – 2014-07-18 (×2): 17 g via ORAL
  Filled 2014-07-17 (×2): qty 1

## 2014-07-17 MED ORDER — HEPARIN BOLUS VIA INFUSION
1500.0000 [IU] | Freq: Once | INTRAVENOUS | Status: AC
  Administered 2014-07-17: 1500 [IU] via INTRAVENOUS
  Filled 2014-07-17: qty 1500

## 2014-07-17 MED ORDER — PREDNISONE 20 MG PO TABS
20.0000 mg | ORAL_TABLET | Freq: Every day | ORAL | Status: DC
  Administered 2014-07-18: 20 mg via ORAL
  Filled 2014-07-17: qty 1

## 2014-07-17 MED ORDER — HEPARIN SODIUM (PORCINE) 5000 UNIT/ML IJ SOLN
5000.0000 [IU] | Freq: Three times a day (TID) | INTRAMUSCULAR | Status: DC
  Administered 2014-07-17 – 2014-07-18 (×2): 5000 [IU] via SUBCUTANEOUS
  Filled 2014-07-17 (×2): qty 1

## 2014-07-17 MED ORDER — IOHEXOL 350 MG/ML SOLN
80.0000 mL | Freq: Once | INTRAVENOUS | Status: AC | PRN
  Administered 2014-07-17: 56 mL via INTRAVENOUS

## 2014-07-17 MED ORDER — HEPARIN (PORCINE) IN NACL 100-0.45 UNIT/ML-% IJ SOLN
1000.0000 [IU]/h | INTRAMUSCULAR | Status: DC
  Administered 2014-07-17: 1000 [IU]/h via INTRAVENOUS
  Filled 2014-07-17: qty 250

## 2014-07-17 NOTE — Progress Notes (Signed)
ANTICOAGULATION CONSULT NOTE - Follow Up Consult  Pharmacy Consult for Heparin Indication: pulmonary embolus  No Known Allergies  Patient Measurements: Height: 5\' 5"  (165.1 cm) Weight: 135 lb (61.236 kg) IBW/kg (Calculated) : 57  Vital Signs: Temp: 98.1 F (36.7 C) (11/08 1353) Temp Source: Oral (11/08 1353) BP: 140/61 mmHg (11/08 1353) Pulse Rate: 88 (11/08 1353)  Labs:  Recent Labs  07/15/14 0905 07/16/14 1555  HGB  --  12.2  HCT  --  35.9*  PLT  --  310  CREATININE 0.95  --   TROPONINI  --  <0.30    Estimated Creatinine Clearance: 39.7 mL/min (by C-G formula based on Cr of 0.95).   Medications:  Infusions:  . sodium chloride 75 mL (07/17/14 0914)    Assessment: 80 year old female admitted with syncope.  She is now to begin anticoagulation with Heparin for pulmonary embolism.  She just received Heparin SQ at 1439 today so I will adjust her bolus dose.  Goal of Therapy:  Heparin level 0.3-0.7 units/ml Monitor platelets by anticoagulation protocol: Yes   Plan:  Give a 1/2 bolus of Heparin = 1500 units IV x 1 Start Heparin infusion at 1000 units/hr Check Heparin level in 8 hours Daily heparin level and CBC Follow-up for long-term anticoagulation plans  Estella HuskMichelle Keyshun Elpers, Pharm.D., BCPS, AAHIVP Clinical Pharmacist Phone: [phone] or [phone] 07/17/2014, 4:07 PM

## 2014-07-17 NOTE — Progress Notes (Signed)
Family Medicine Teaching Service Daily Progress Note Intern Pager: [phone]  [name] Medical record number: [id number] Date of birth: [birth year] Age: 80 y.o. Gender: female  Primary Care Provider: Nicoletta Dress, MD Consultants: Cardiology Code Status: Full  Pt Overview and Major Events to Date:  07/13/14: Admitted 07/16/14: Developed tachycardia and tachypnea. Abx broadened and cultures drawn.  Assessment and Plan: Victoria Lewis is a 80 y.o. female presenting with syncope. PMH is significant for previous syncopal episodes, HTN, CKD III, chronic cystitis, dementia, GERD, Anemia, Hiatal Hernia, and Arthritis.  # Syncope Secondary to Complete Heart Block- two episodes of syncope with one near episode of syncope. Dose of metoprolol doubled prior to hospitalization. - Cardiology consulted - appreciate recommnedations  - Loop recorder shows episodes of complete heart block during syncope  - Holding metoprolol and no more episodes of CHB  - Signing off today - EKG/troponins negative - CT Head- Advanced atrophy and extensive chronic microvascular changes. No acute intracranial abnormality.  # SIRS- tachycardic, tachypneic - H/O UTI with chronic cystitis. Admitted on Ciprofloxacin 125mg . Complained of change in smell of urine on 11/5 and increased dose to 250mg  BID. Was on Day #3 of treatment when SIRS criteria was met. Urinalysis was normal. Ciprofloxacin was discontinued and abx broadened on 11/7 - Zosyn and Vancomycin (11/7>>)  - Abx Day #4. IV Abx Day #2 - Follow up Blood Cx and Urine Cx - Prednisone 20mg  x3-4 days - Continue to monitor  # HTN- Home medication of Metoprolol, which is being held at this time - 139/64 currently - NS at 38ml/hr - Following Cardiology recommendations  - Increase norvasc to 10, possibly add hydralazine   # CKD Stage III- Creatinine 1.09 at admission. Baseline 0.9-1. - Cr 0.95 today--at baseline - Continue to monitor    FEN/GI: Heart Healthy/Carb Modified; NS@75  Prophylaxis: Subcutaneous Heparin  Disposition: Admitted to [facility], continue to monitor HR, BP, and MS.  -PT/OT recommends discharging to SNF, however family refuses.  Subjective:  No acute complaints overnight. Family at bedside states she is improved since yesterday, however she is still more somnolent than at baseline. States she does not feel well, but due to demented state is unable to specify what she means. Denies abdominal pain, pain with respirations, or neck pain.  Family has not noted any ulcerations during bedding changes or with turning. States the antibiotics seem to be working. No further complaints today.      Objective: Temp:  [98.7 F (37.1 C)-98.9 F (37.2 C)] 98.7 F (37.1 C) (11/08 0526) Pulse Rate:  [120-125] 124 (11/08 0037) Resp:  [16-22] 20 (11/08 0526) BP: (124-160)/(56-81) 139/64 mmHg (11/08 0911) SpO2:  [93 %-98 %] 95 % (11/08 0526) Physical Exam: General: 80yo female resting comfortably and in no apparent distress HEENT: NCAT Cardiovascular: S1 and S2; no murmurs/rubs/gallops. RRR Respiratory: Clear to auscultation bilaterally. No wheezing/rales/rhonchi. No increased work of breathing. Abdomen: Bowel sounds noted. Soft and nondistended. Non-tender.  Extremities: No edema noted. Neuro- A&O to person and city, not to time or hospital  Laboratory:  Recent Labs Lab 07/13/14 1445 07/14/14 0630 07/16/14 1555  WBC 8.4 6.4 5.8  HGB 13.5 12.8 12.2  HCT 40.2 38.1 35.9*  PLT 347 354 310    Recent Labs Lab 07/13/14 1445 07/14/14 0630 07/15/14 0905  NA 131* 134* 134*  K 4.3 4.2 4.5  CL 93* 96 100  CO2 26 22 18*  BUN 18 19 16   CREATININE 1.09 1.16* 0.95  CALCIUM 10.3 9.9 8.8  GLUCOSE 93 91 138*   Imaging/Diagnostic Tests: Dg Chest Port 1 View  07/16/2014   CLINICAL DATA:  Tachycardia  EXAM: PORTABLE CHEST - 1 VIEW  COMPARISON:  03/21/2014  FINDINGS: Chronic interstitial  markings/emphysematous changes. No focal consolidation. No pleural effusion or pneumothorax.  Eventration of the hemidiaphragm.  Heart is normal size.  Holter monitor overlies the left hemithorax.  IMPRESSION: No evidence of acute cardiopulmonary disease.   Electronically Signed   By: Julian Hy M.D.   On: 07/16/2014 15:34    Lorna Few, DO 07/17/2014, 10:25 AM PGY-1, [place] Intern pager: [phone], text pages welcome

## 2014-07-17 NOTE — Progress Notes (Signed)
Patient ID: [name], female   DOB: 1928-05-13, 80 y.o.   MRN: [id number]    Subjective:  Denies SSCP, palpitations or Dyspnea   Objective:  Filed Vitals:   07/16/14 2138 07/16/14 2238 07/17/14 0037 07/17/14 0526  BP: 150/67 124/56 141/66 128/60  Pulse: 124 125 124   Temp: 98.9 F (37.2 C)   98.7 F (37.1 C)  TempSrc: Oral   Oral  Resp: 21 22 21 20   Height:      Weight:      SpO2: 97% 95% 93% 95%    Intake/Output from previous day:  Intake/Output Summary (Last 24 hours) at 07/17/14 0759 Last data filed at 07/16/14 1700  Gross per 24 hour  Intake    440 ml  Output      0 ml  Net    440 ml    Physical Exam: Affect appropriate Healthy:  appears stated age HEENT: normal Neck supple with no adenopathy JVP normal no bruits no thyromegaly Lungs clear with no wheezing and good diaphragmatic motion Heart:  S1/S2 no murmur, no rub, gallop or click PMI normal Abdomen: benighn, BS positve, no tenderness, no AAA no bruit.  No HSM or HJR Distal pulses intact with no bruits No edema Neuro non-focal Skin warm and dry No muscular weakness   Lab Results: Basic Metabolic Panel:  Recent Labs  08/65/7810/02/21 0905  NA 134*  K 4.5  CL 100  CO2 18*  GLUCOSE 138*  BUN 16  CREATININE 0.95  CALCIUM 8.8   CBC:  Recent Labs  07/16/14 1555  WBC 5.8  HGB 12.2  HCT 35.9*  MCV 94.7  PLT 310   Cardiac Enzymes:  Recent Labs  07/16/14 1555  TROPONINI <0.30    Imaging: Dg Chest Port 1 View  07/16/2014   CLINICAL DATA:  Tachycardia  EXAM: PORTABLE CHEST - 1 VIEW  COMPARISON:  03/21/2014  FINDINGS: Chronic interstitial markings/emphysematous changes. No focal consolidation. No pleural effusion or pneumothorax.  Eventration of the hemidiaphragm.  Heart is normal size.  Holter monitor overlies the left hemithorax.  IMPRESSION: No evidence of acute cardiopulmonary disease.   Electronically Signed   By: Charline BillsSriyesh  Krishnan M.D.   On: 07/16/2014 15:34    Cardiac  Studies:  ECG:  SR rate 80 LAD PAC no AV block or BBB    Telemetry:  NSR rate 88 no heart block or long pauses   Echo:   Medications:   . acetaminophen  500 mg Oral QID  . acidophilus  1 capsule Oral Daily  . amLODipine  10 mg Oral Daily  . antiseptic oral rinse  7 mL Mouth Rinse BID  . aspirin EC  81 mg Oral Daily  . cholecalciferol  2,000 Units Oral Daily  . heparin  5,000 Units Subcutaneous 3 times per day  . Influenza vac split quadrivalent PF  0.5 mL Intramuscular Tomorrow-1000  . pantoprazole  40 mg Oral Daily  . piperacillin-tazobactam (ZOSYN)  IV  3.375 g Intravenous Q8H  . senna  1 tablet Oral BID  . sodium chloride  3 mL Intravenous Q12H  . sodium chloride  3 mL Intravenous Q12H  . vancomycin  1,000 mg Intravenous Q24H  . vitamin B-12  1,000 mcg Oral Daily     . sodium chloride 75 mL (07/16/14 1028)    Assessment/Plan:  CHB:  Resolved no need for pacer  Telemetry with SR and Sinus tachycardia   HTN:  Beta blocker stopped  Increased norvasc 10 mg  improved  UTI:  On cipro    Consider rehab stay will sign off   Charlton Hawseter Ekam Bonebrake 07/17/2014, 7:59 AM

## 2014-07-18 DIAGNOSIS — R0682 Tachypnea, not elsewhere classified: Secondary | ICD-10-CM

## 2014-07-18 LAB — CBC
HCT: 33.8 % — ABNORMAL LOW (ref 36.0–46.0)
Hemoglobin: 11.2 g/dL — ABNORMAL LOW (ref 12.0–15.0)
MCH: 31.9 pg (ref 26.0–34.0)
MCHC: 33.1 g/dL (ref 30.0–36.0)
MCV: 96.3 fL (ref 78.0–100.0)
PLATELETS: 310 10*3/uL (ref 150–400)
RBC: 3.51 MIL/uL — ABNORMAL LOW (ref 3.87–5.11)
RDW: 13.5 % (ref 11.5–15.5)
WBC: 6.4 10*3/uL (ref 4.0–10.5)

## 2014-07-18 LAB — URINE CULTURE: Colony Count: >=100000

## 2014-07-18 LAB — HEPARIN LEVEL (UNFRACTIONATED): Heparin Unfractionated: 0.99 IU/mL — ABNORMAL HIGH (ref 0.30–0.70)

## 2014-07-18 MED ORDER — CIPROFLOXACIN HCL 500 MG PO TABS
250.0000 mg | ORAL_TABLET | Freq: Two times a day (BID) | ORAL | Status: DC
  Administered 2014-07-18: 250 mg via ORAL
  Filled 2014-07-18: qty 1

## 2014-07-18 MED ORDER — PREDNISONE 20 MG PO TABS: 20.0000 mg | ORAL_TABLET | Freq: Every day | ORAL | Status: DC

## 2014-07-18 MED ORDER — CIPROFLOXACIN HCL 250 MG PO TABS: 250.0000 mg | ORAL_TABLET | Freq: Two times a day (BID) | ORAL | Status: DC

## 2014-07-18 MED ORDER — AMLODIPINE BESYLATE 10 MG PO TABS: 10.0000 mg | ORAL_TABLET | Freq: Every day | ORAL | Status: DC

## 2014-07-18 NOTE — Plan of Care (Signed)
Problem: Phase I Progression Outcomes Goal: OOB as tolerated unless otherwise ordered Outcome: Completed/Met Date Met:  07/18/14 Goal: Initial discharge plan identified Outcome: Completed/Met Date Met:  07/18/14 Goal: Other Phase I Outcomes/Goals Outcome: Completed/Met Date Met:  07/18/14  Problem: Phase II Progression Outcomes Goal: Progress activity as tolerated unless otherwise ordered Outcome: Completed/Met Date Met:  07/18/14 Goal: Discharge plan established Outcome: Completed/Met Date Met:  07/18/14 Goal: Obtain order to discontinue catheter if appropriate Outcome: Completed/Met Date Met:  07/18/14 Goal: Other Phase II Outcomes/Goals Outcome: Completed/Met Date Met:  07/18/14  Problem: Phase III Progression Outcomes Goal: Activity at appropriate level-compared to baseline (UP IN CHAIR FOR HEMODIALYSIS)  Outcome: Not Progressing Goal: Voiding independently Outcome: Completed/Met Date Met:  07/18/14 Goal: IV/normal saline lock discontinued Outcome: Completed/Met Date Met:  07/18/14 Goal: Foley discontinued Outcome: Not Applicable Date Met:  76/22/63 Goal: Discharge plan remains appropriate-arrangements made Outcome: Completed/Met Date Met:  07/18/14 Goal: Other Phase III Outcomes/Goals Outcome: Completed/Met Date Met:  07/18/14  Problem: Discharge Progression Outcomes Goal: Discharge plan in place and appropriate Outcome: Completed/Met Date Met:  07/18/14 Goal: Hemodynamically stable Outcome: Completed/Met Date Met:  33/54/56 Goal: Complications resolved/controlled Outcome: Completed/Met Date Met:  07/18/14 Goal: Activity appropriate for discharge plan Outcome: Completed/Met Date Met:  07/18/14 Goal: Other Discharge Outcomes/Goals Outcome: Completed/Met Date Met:  07/18/14

## 2014-07-18 NOTE — Discharge Instructions (Signed)
It was great to meet Victoria Lewis   She needs 4 more days of her antibiotic, start it tomorrow as she just got her night time dose here.   Please follow up with dr. Tomasa BlaseSchultz by the end of the week.     Syncope Syncope means a person passes out (faints). The person usually wakes up in less than 5 minutes. It is important to seek medical care for syncope. HOME CARE  Have someone stay with you until you feel normal.  Do not drive, use machines, or play sports until your doctor says it is okay.  Keep all doctor visits as told.  Lie down when you feel like you might pass out. Take deep breaths. Wait until you feel normal before standing up.  Drink enough fluids to keep your pee (urine) clear or pale yellow.  If you take blood pressure or heart medicine, get up slowly. Take several minutes to sit and then stand. GET HELP RIGHT AWAY IF:   You have a severe headache.  You have pain in the chest, belly (abdomen), or back.  You are bleeding from the mouth or butt (rectum).  You have black or tarry poop (stool).  You have an irregular or very fast heartbeat.  You have pain with breathing.  You keep passing out, or you have shaking (seizures) when you pass out.  You pass out when sitting or lying down.  You feel confused.  You have trouble walking.  You have severe weakness.  You have vision problems. If you fainted, call your local emergency services (911 in [place] Do not drive yourself to the hospital. MAKE SURE YOU:   Understand these instructions.  Will watch your condition.  Will get help right away if you are not doing well or get worse. Document Released: 02/12/2008 Document Revised: 02/25/2012 Document Reviewed: 10/25/2011 [facility] Patient Information 2015 [employer] [place]. This information is not intended to replace advice given to you by your health care provider. Make sure you discuss any questions you have with your health care provider.

## 2014-07-18 NOTE — Progress Notes (Signed)
CSW (Clinical Child psychotherapistocial Worker) notified by pt nurse that staff is uncomfortable with pt discharging via car. CSW spoke with pt son at bedside. Pt son informed CSW that family does not want non-emergent ambulance and they prefer to transfer. CSW expressed staff concerns about being able to safely transfer pt. Pt son acknowledged concern but still refused non-emergent ambulance transport. Pt son informed CSW that additional family coming to assist with discharge and transport.  Amylee Lodato, LCSWA [phone]

## 2014-07-18 NOTE — Progress Notes (Signed)
   07/18/14 1622  PT Visit Information  Last PT Received On 07/18/14  Assistance Needed +2  History of Present Illness 80 y.o. female presenting with syncope. PMH is significant for previous syncopal episodes, HTN, CKD III, chronic cystitis, dementia, GERD, Anemia, Hiatal Hernia, and Arthritis. [facility]. [facility].   PT Time Calculation  PT Start Time 1540  PT Stop Time 1551  PT Time Calculation (min) 11 min  Subjective Data  Subjective "I am so tired."  Precautions  Precautions Fall  Precaution Comments syncopal episodes  Restrictions  Weight Bearing Restrictions No  Pain Assessment  Pain Assessment No/denies pain  Cognition  Arousal/Alertness Awake/alert  Behavior During Therapy WFL for tasks assessed/performed  Overall Cognitive Status History of cognitive impairments - at baseline  Bed Mobility  Overal bed mobility Needs Assistance;+2 for physical assistance  Bed Mobility Sit to Supine  Supine to sit Max assist;+2 for physical assistance  General bed mobility comments total assist to assist pt back to bed.  She had been up awhile.  Transfers  Overall transfer level Needs assistance  Equipment used 2 person hand held assist  Transfers Sit to/from BJ'sStand;Stand Pivot Transfers  Sit to Stand Total assist;Max assist  Stand pivot transfers Max assist;+2 physical assistance  General transfer comment Nursing called this PT in to assist with standing so pt could be cleaned as they had placed bedpan under pt.  This PT assisted nurse with standing pt while NT cleaned pt.  Stood pt x2 with +2 max assist with pt with posterior lean.  Pt was stooling while being cleaned which is why she had to stand x 2.  On second stand once cleaned pt pivoted to bed with max assist of 2 with asssit needed to move her LEs.    Balance  Overall balance assessment Needs assistance;History of Falls  Sitting-balance support Bilateral upper extremity  supported;Feet supported  Sitting balance-Leahy Scale Poor  Sitting balance - Comments leans posteriorly in sitting.   Standing balance support Bilateral upper extremity supported;During functional activity  Standing balance-Leahy Scale Zero  Standing balance comment significant posterior lean needed max assist of 2 persons.   PT - End of Session  Equipment Utilized During Treatment Gait belt;Oxygen  Activity Tolerance Patient limited by fatigue  Patient left in bed;with call bell/phone within reach;with bed alarm set;with nursing/sitter in room  Nurse Communication Mobility status  PT - Assessment/Plan  PT Plan Current plan remains appropriate  PT Frequency Min 3X/week  Follow Up Recommendations Home health PT;Supervision/Assistance - 24 hour;Other (comment) (aide for help in the mornings--SNF refused by daughter at th)  PT equipment None recommended by PT  PT Goal Progression  Progress towards PT goals Not progressing toward goals - comment (pt max assist and fatigued)  PT General Charges  $$ ACUTE PT VISIT 1 Procedure  PT Treatments  $Self Care/Home Management 8-22  [facility] Daneil Beem,PT Acute Rehabilitation [phone] (pager)

## 2014-07-18 NOTE — Progress Notes (Addendum)
Physical Therapy Treatment Patient Details Name: Victoria Lewis MRN: [id number] DOB: 11-27-1927 Today's Date: 07/18/2014    History of Present Illness 80 y.o. female presenting with syncope. PMH is significant for previous syncopal episodes, HTN, CKD III, chronic cystitis, dementia, GERD, Anemia, Hiatal Hernia, and Arthritis. Recent admission to [name] for UTI last week. D/[facility]ome from [name] 07/12/14.     PT Comments    Pt admitted with above. Pt currently with functional limitations due to the deficits listed below (see PT Problem List).  Pt requires +2 assist for transfers.  Daughter wanted to see Victoria Lewis transfer but it does not work for pt at present as pt with too much of posterior lean at present.  Walker transfer requires 2 person assist as well.  Daughter aware and is still adamant that they are taking pt home with [name] services.  Daughter still wanted [name] as she states pt may progress to using that.  This PT gave her the info and informed her that the bariatric Victoria Lewis might work for pt as the buttock pads swing away.  Encouraged daughter to ask if [name] care can bring Stedy to try with pt later in the therapy time at home prior to buying one.   Pt met 2/4 goals.  Goals revised for 2 weeks.  Pt will benefit from skilled PT to increase their independence and safety with mobility to allow discharge to the venue listed below.   Follow Up Recommendations  Home health PT;Supervision/Assistance - 24 hour;Other (comment) (aide for help in the mornings--SNF refused by daughter at th)     Equipment Recommendations  None recommended by PT    Recommendations for Other Services       Precautions / Restrictions Precautions Precautions: Fall Precaution Comments: syncopal episodes Restrictions Weight Bearing Restrictions: No    Mobility  Bed Mobility Overal bed mobility: Needs Assistance;+2 for physical assistance Bed Mobility: Sit to Supine     Supine to sit: Max assist;+2 for physical  assistance     General bed mobility comments: total assist to assist pt back to bed.  She had been up awhile.  Transfers Overall transfer level: Needs assistance Equipment used: 2 person hand held assist Transfers: Sit to/from [employer] Sit to Stand: Total assist;Max assist Stand pivot transfers: Max assist;+2 physical assistance       General transfer comment: Nursing called this PT in to assist with standing so pt could be cleaned as they had placed bedpan under pt.  This PT assisted nurse with standing pt while NT cleaned pt.  Stood pt x2 with +2 max assist with pt with posterior lean.  Pt was stooling while being cleaned which is why she had to stand x 2.  On second stand once cleaned pt pivoted to bed with max assist of 2 with asssit needed to move her LEs.    Ambulation/Gait                 Stairs            Wheelchair Mobility    Modified Rankin (Stroke Patients Only)       Balance Overall balance assessment: Needs assistance;History of Falls Sitting-balance support: Bilateral upper extremity supported;Feet supported Sitting balance-Leahy Scale: Poor Sitting balance - Comments: leans posteriorly in sitting.    Standing balance support: Bilateral upper extremity supported;During functional activity Standing balance-Leahy Scale: Zero Standing balance comment: significant posterior lean needed max assist of 2 persons.  Cognition Arousal/Alertness: Awake/alert Behavior During Therapy: WFL for tasks assessed/performed Overall Cognitive Status: History of cognitive impairments - at baseline                      Exercises General Exercises - Lower Extremity Ankle Circles/Pumps: AROM;Both;10 reps;Supine Long Arc Quad: AROM;Both;10 reps;Seated Hip ABduction/ADduction: AROM;Both;10 reps;Supine    General Comments        Pertinent Vitals/Pain Pain Assessment: No/denies pain  VSS.    Home Living                       Prior Function            PT Goals (current goals can now be found in the care plan section) Acute Rehab PT Goals Patient Stated Goal: family states to return home PT Goal Formulation: With patient Time For Goal Achievement: 08/01/14 Potential to Achieve Goals: Fair Progress towards PT goals: Not progressing toward goals - comment (pt max assist and fatigued)    Frequency  Min 3X/week    PT Plan Current plan remains appropriate    Co-evaluation PT/OT/SLP Co-Evaluation/Treatment: Yes Reason for Co-Treatment: For patient/therapist safety PT goals addressed during session: Mobility/safety with mobility OT goals addressed during session: ADL's and self-care     End of Session Equipment Utilized During Treatment: Gait belt;Oxygen Activity Tolerance: Patient limited by fatigue Patient left: in bed;with call bell/phone within reach;with bed alarm set;with nursing/sitter in room     Time: 1540-1551 PT Time Calculation (min): 11 min  Charges:  $Therapeutic Activity: 8-22 mins $Self Care/Home Management: 8-22                    G CodesDenice Lewis 2014/08/12, 5:05 PM Victoria Lewis,PT Acute Rehabilitation [phone] (pager)

## 2014-07-18 NOTE — Progress Notes (Signed)
Family Medicine Teaching Service Daily Progress Note Intern Pager: [phone]  [name] Medical record number: [id number] Date of birth: 12-23-1927 Age: 80 y.o. Gender: female  Primary Care Provider: Nicoletta Dress, MD Consultants: Cardiology Code Status: Full  Pt Overview and Major Events to Date:  07/13/14: Admitted 07/16/14: Developed tachycardia and tachypnea. Abx broadened and cultures drawn. 07/18/14: Abx de-escalated to Ciprofloxacin  Assessment and Plan: Victoria Lewis is a 80 y.o. female presenting with syncope. PMH is significant for previous syncopal episodes, HTN, CKD III, chronic cystitis, dementia, GERD, Anemia, Hiatal Hernia, and Arthritis.  # Syncope Secondary to Complete Heart Block- two episodes of syncope with one near episode of syncope. Dose of metoprolol doubled prior to hospitalization. - Cardiology consulted - appreciate recommnedations  - Loop recorder shows episodes of complete heart block during syncope  - Holding metoprolol and no more episodes of CHB  - Signed off - EKG/troponins negative - CT Head- Advanced atrophy and extensive chronic microvascular changes. No acute intracranial abnormality.  # SIRS- tachycardic, tachypneic - H/O UTI with chronic cystitis. Admitted on Ciprofloxacin 125mg . Complained of change in smell of urine on 11/5 and increased dose to 250mg  BID. Was on Day #3 of treatment when SIRS criteria was met. Urinalysis was normal. Ciprofloxacin was discontinued and abx broadened on 11/7 - Zosyn and Vancomycin (11/7>>11/9)  - Abx Day #4. IV Abx Day #2  - Will transition to Ciprofloxacin 250mg  today - Blood Cx shows no growth to date. Urine Cx shows multiple bacterial morphotypes, none predominant. - Prednisone 20mg  x3-4 days (Day #2) - Continue to monitor  # HTN- Home medication of Metoprolol, which is being held at this time - 157/79 currently - NS at 26ml/hr - Following Cardiology recommendations  - Increase  norvasc to 10, possibly add hydralazine   FEN/GI: Heart Healthy/Carb Modified; NS@75  Prophylaxis: Subcutaneous Heparin  Disposition: Admitted to [place], continue to monitor HR, BP, and MS.  -PT/OT recommends discharging to SNF, however family refuses.  Subjective:  No acute complaints overnight. Family states she is doing much better today.  Has not had a bowel movement, but she has felt the urge multiple times, which is an improvement.  Would like to try another enema today. No further complaints.  Objective: Temp:  [97.8 F (36.6 C)-98.3 F (36.8 C)] 98.3 F (36.8 C) (11/09 0619) Pulse Rate:  [85-91] 85 (11/09 0619) Resp:  [12-22] 12 (11/09 0638) BP: (130-147)/(61-75) 137/70 mmHg (11/09 0638) SpO2:  [94 %-99 %] 94 % (11/09 6283) Physical Exam: General: 80yo female resting comfortably and in no apparent distress HEENT: NCAT Cardiovascular: S1 and S2; no murmurs/rubs/gallops. RRR Respiratory: Clear to auscultation bilaterally. No wheezing/rales/rhonchi. No increased work of breathing. Abdomen: Bowel sounds noted. Soft and nondistended. Non-tender.  Extremities: No edema noted. Neuro- A&O to person and city, not to time or hospital  Laboratory:  Recent Labs Lab 07/14/14 0630 07/16/14 1555 07/18/14 0015  WBC 6.4 5.8 6.4  HGB 12.8 12.2 11.2*  HCT 38.1 35.9* 33.8*  PLT 354 310 310    Recent Labs Lab 07/13/14 1445 07/14/14 0630 07/15/14 0905  NA 131* 134* 134*  K 4.3 4.2 4.5  CL 93* 96 100  CO2 26 22 18*  BUN 18 19 16   CREATININE 1.09 1.16* 0.95  CALCIUM 10.3 9.9 8.8  GLUCOSE 93 91 138*   Imaging/Diagnostic Tests: Ct Angio Chest Pe W/cm &/or Wo Cm  07/17/2014   CLINICAL DATA:  Syncope,tachypnea, elevated D-dimer  EXAM:  CT ANGIOGRAPHY CHEST WITH CONTRAST  TECHNIQUE: Multidetector CT imaging of the chest was performed using the standard protocol during bolus administration of intravenous contrast. Multiplanar CT image reconstructions and  MIPs were obtained to evaluate the vascular anatomy.  CONTRAST:  69mL OMNIPAQUE IOHEXOL 350 MG/ML SOLN  COMPARISON:  Chest radiograph dated 07/16/2014  FINDINGS: No evidence of pulmonary embolism.  Trace bilateral pleural effusions. Elevation of the right hemidiaphragm with associated right basilar atelectasis. Mild left basilar atelectasis. Mild paraseptal emphysematous changes. No suspicious pulmonary nodules. No pneumothorax.  Visualized thyroid unremarkable.  Cardiomegaly. No pericardial effusion. Coronary atherosclerosis. Atherosclerotic calcifications of the aortic arch.  No suspicious mediastinal, hilar, or axillary lymphadenopathy.  Moderate hiatal hernia.  Degenerative changes of the visualized thoracolumbar spine. Mild superior endplate changes at T6 (sagittal image 35).  Review of the MIP images confirms the above findings.  IMPRESSION: No evidence of pulmonary embolism.  Trace bilateral pleural effusions.   Electronically Signed   By: Julian Hy M.D.   On: 07/17/2014 18:24    Lorna Few, DO 07/18/2014, 9:48 AM PGY-1, [place] Intern pager: [phone], text pages welcome

## 2014-07-18 NOTE — Progress Notes (Signed)
Patient ID: Victoria Lewis, female   DOB: Apr 26, 1928, 80 y.o.   MRN: [id number]    Subjective:  Denies SSCP, palpitations or Dyspnea   Objective:  Filed Vitals:   07/18/14 0038 07/18/14 0438 07/18/14 0619 07/18/14 0638  BP: 130/64 147/75 147/75 137/70  Pulse:   85   Temp:   98.3 F (36.8 C)   TempSrc:   Oral   Resp: 14 18 18 12   Height:      Weight:      SpO2:   94%     Intake/Output from previous day:  Intake/Output Summary (Last 24 hours) at 07/18/14 [id number] Last data filed at 07/17/14 1133  Gross per 24 hour  Intake    640 ml  Output      0 ml  Net    640 ml    Physical Exam: Affect appropriate Healthy:  appears stated age HEENT: normal Neck supple with no adenopathy JVP normal no bruits no thyromegaly Lungs clear with no wheezing and good diaphragmatic motion Heart:  S1/S2 no murmur, no rub, gallop or click PMI normal Abdomen: benighn, BS positve, no tenderness, no AAA no bruit.  No HSM or HJR Distal pulses intact with no bruits No edema Neuro non-focal Skin warm and dry No muscular weakness   Lab Results: Basic Metabolic Panel:  Recent Labs  11/91/4710/02/21 0905  NA 134*  K 4.5  CL 100  CO2 18*  GLUCOSE 138*  BUN 16  CREATININE 0.95  CALCIUM 8.8   CBC:  Recent Labs  07/16/14 1555 07/18/14 0015  WBC 5.8 6.4  HGB 12.2 11.2*  HCT 35.9* 33.8*  MCV 94.7 96.3  PLT 310 310   Cardiac Enzymes:  Recent Labs  07/16/14 1555  TROPONINI <0.30    Imaging: Ct Angio Chest Pe W/cm &/or Wo Cm  07/17/2014   CLINICAL DATA:  Syncope,tachypnea, elevated D-dimer  EXAM: CT ANGIOGRAPHY CHEST WITH CONTRAST  TECHNIQUE: Multidetector CT imaging of the chest was performed using the standard protocol during bolus administration of intravenous contrast. Multiplanar CT image reconstructions and MIPs were obtained to evaluate the vascular anatomy.  CONTRAST:  56mL OMNIPAQUE IOHEXOL 350 MG/ML SOLN  COMPARISON:  Chest radiograph dated 07/16/2014  FINDINGS: No evidence  of pulmonary embolism.  Trace bilateral pleural effusions. Elevation of the right hemidiaphragm with associated right basilar atelectasis. Mild left basilar atelectasis. Mild paraseptal emphysematous changes. No suspicious pulmonary nodules. No pneumothorax.  Visualized thyroid unremarkable.  Cardiomegaly. No pericardial effusion. Coronary atherosclerosis. Atherosclerotic calcifications of the aortic arch.  No suspicious mediastinal, hilar, or axillary lymphadenopathy.  Moderate hiatal hernia.  Degenerative changes of the visualized thoracolumbar spine. Mild superior endplate changes at T6 (sagittal image 35).  Review of the MIP images confirms the above findings.  IMPRESSION: No evidence of pulmonary embolism.  Trace bilateral pleural effusions.   Electronically Signed   By: Charline BillsSriyesh  Krishnan M.D.   On: 07/17/2014 18:24   Dg Chest Port 1 View  07/16/2014   CLINICAL DATA:  Tachycardia  EXAM: PORTABLE CHEST - 1 VIEW  COMPARISON:  03/21/2014  FINDINGS: Chronic interstitial markings/emphysematous changes. No focal consolidation. No pleural effusion or pneumothorax.  Eventration of the hemidiaphragm.  Heart is normal size.  Holter monitor overlies the left hemithorax.  IMPRESSION: No evidence of acute cardiopulmonary disease.   Electronically Signed   By: Charline BillsSriyesh  Krishnan M.D.   On: 07/16/2014 15:34    Cardiac Studies:  ECG:  SR rate 80 LAD PAC no AV block or BBB  Telemetry:  NSR rate 88 no heart block or long pauses   Echo:   Medications:   . acetaminophen  500 mg Oral QID  . acidophilus  1 capsule Oral Daily  . amLODipine  10 mg Oral Daily  . antiseptic oral rinse  7 mL Mouth Rinse BID  . aspirin EC  81 mg Oral Daily  . cholecalciferol  2,000 Units Oral Daily  . heparin subcutaneous  5,000 Units Subcutaneous 3 times per day  . Influenza vac split quadrivalent PF  0.5 mL Intramuscular Tomorrow-1000  . pantoprazole  40 mg Oral Daily  . piperacillin-tazobactam (ZOSYN)  IV  3.375 g Intravenous  Q8H  . polyethylene glycol  17 g Oral Daily  . predniSONE  20 mg Oral Q breakfast  . senna  1 tablet Oral BID  . sodium chloride  3 mL Intravenous Q12H  . sodium chloride  3 mL Intravenous Q12H  . vancomycin  1,000 mg Intravenous Q24H  . vitamin B-12  1,000 mcg Oral Daily     . sodium chloride 75 mL/hr at 07/18/14 0230    Assessment/Plan:  CHB:  Resolved no need for pacer  Telemetry with SR and Sinus tachycardia   HTN:  Beta blocker stopped  Increased norvasc 10 mg improved  UTI:  Zosyn plan per primary service    Consider rehab stay will sign off   [name] 07/18/2014, 8:28 AM

## 2014-07-18 NOTE — Progress Notes (Signed)
Occupational Therapy Treatment Patient Details Name: Victoria Lewis MRN: [id number] DOB: 04/13/1928 Today's Date: 07/18/2014    History of present illness 80 y.o. female presenting with syncope. PMH is significant for previous syncopal episodes, HTN, CKD III, chronic cystitis, dementia, GERD, Anemia, Hiatal Hernia, and Arthritis. [facility]    OT comments  Attempted use of Stedy lift equipment, but pt unable to benefit due to strong posterior lean.  Daughter observed transfer of bed to chair which required 2 person assist. She continues to maintain that family can manage pt at home and that pt does well with HHPT.    Follow Up Recommendations  Supervision/Assistance - 24 hour    Equipment Recommendations  Other (comment) (may need lift equipment, daughter concerned about size)    Recommendations for Other Services      Precautions / Restrictions Precautions Precautions: Fall Precaution Comments: syncopal episodes Restrictions Weight Bearing Restrictions: No       Mobility Bed Mobility Overal bed mobility: +2 for physical assistance;Needs Assistance Bed Mobility: Supine to Sit     Supine to sit: Total assist;+2 for physical assistance     General bed mobility comments: Pt reaching with arms to rails with mod A but not initiating movement or helping to roll Daughter states that pt needed a lot of cues for each movement PTA.    Transfers Overall transfer level: Needs assistance Equipment used: 2 person hand held assist;Rolling walker (2 wheeled);Ambulation equipment used (STEDY) Transfers: Sit to/from Stand Sit to Stand: +2 physical assistance;Max assist Stand pivot transfers: Max assist;+2 physical assistance       General transfer comment: pt with posterior lean, attempted to use Stedy with daughter observing, posterior leaning inhibiting ability to benefit from [employer], pt not able to effectively move R  LE during transfer.    Balance   Sitting-balance support: Feet supported Sitting balance-Leahy Scale: Poor       Standing balance-Leahy Scale: Zero                     ADL Overall ADL's : Needs assistance/impaired Eating/Feeding: Minimal assistance;Sitting Eating/Feeding Details (indicate cue type and reason): able to access tray better in sitting                         Toileting- Clothing Manipulation and Hygiene: +2 for physical assistance;Total assistance;Sit to/from stand       Functional mobility during ADLs: +2 for physical assistance;Maximal assistance General ADL Comments: bed to chair      Vision                     Perception     Praxis      Cognition   Behavior During Therapy: [facility] for tasks assessed/performed Overall Cognitive Status: History of cognitive impairments - at baseline                       Extremity/Trunk Assessment               Exercises     Shoulder Instructions       General Comments      Pertinent Vitals/ Pain       Pain Assessment: No/denies pain  Home Living  Prior Functioning/Environment              Frequency Min 2X/week     Progress Toward Goals  OT Goals(current goals can now be found in the care plan section)  Progress towards OT goals: Progressing toward goals  Acute Rehab OT Goals Patient Stated Goal: family states to return home  Plan Discharge plan remains appropriate    Co-evaluation    PT/OT/SLP Co-Evaluation/Treatment: Yes Reason for Co-Treatment: For patient/therapist safety PT goals addressed during session: Mobility/safety with mobility OT goals addressed during session: ADL's and self-care      End of Session Equipment Utilized During Treatment: Rolling walker;Gait belt   Activity Tolerance Patient tolerated treatment well   Patient Left in chair;with call bell/phone within reach;with  family/visitor present   Nurse Communication          Time: 1140-1200 OT Time Calculation (min): 20 min  Charges: OT General Charges $OT Visit: 1 Procedure OT Treatments $Self Care/Home Management : 8-22 mins  Evern BioMayberry, Tamaiya Bump Lynn 07/18/2014, 2:07 PM  [phone]

## 2014-07-18 NOTE — Progress Notes (Signed)
Pt discharged to home per MD order. Pt husband, son and daughters received and reviewed all discharge instructions and medication information including follow-up appointments and prescription information. Pt family verbalized understanding. Pt alert and oriented to person at discharge with no complaints of pain. Pt IV and telemetry box removed prior to discharge. Pt requiring 3 person assist to transfer from chair to bed at time of discharge. Pt family refusing non-emergent ambulance transportation as recommended by nursing staff for patient safety at discharge. Pt escorted to private vehicle via wheelchair by daughter and son per family request. Victoria GrapesMonge, Vong Garringer C

## 2014-07-19 NOTE — Discharge Summary (Signed)
[facility]  Patient name: Victoria HelperBetty C Lewis Medical record number: [id number] Date of birth: 12-30-1927 Age: 80 y.o. Gender: female Date of Admission: 07/13/2014  Date of Discharge: 07/18/14 Admitting Physician: Nestor RampSara L Neal, MD  Primary Care Provider: Paulina FusiSCHULTZ,DOUGLAS E, MD Consultants: Cardiology  Indication for Hospitalization: Syncope  Discharge Diagnoses/Problem List:  Syncope Transient Complete Heart Block Hyponatremia HTN CKD Stage III GERD Dementia  Disposition: Discharge home.  Discharge Condition: Stable  Brief Hospital Course:  Victoria Lewis presented to the ED following two episodes of syncope. Loop recorder was evaluated and she was determined to be in complete heart block during episodes of syncope. Troponins were negative.CT of head showed advanced atrophy and extensive chronic microvascular changes, but no acute intracranial abnormality. Cardiology was consulted and recommended discontinuing Metoprolol, which was recently doubled prior to syncopal episodes. Once metoprolol was stopped, Victoria Lewis was monitored for further heart block in case of need for pacemaker. No further episodes of heart block were noted. Once metoprolol was stopped, Victoria Lewis developed hypertension and Norvasc was added to medication regimen.  Victoria Lewis had a history of UTI prior to admission and was on Ciprofloxacin 125mg  at admission. Was noted to be taking it with iron and calcium, which was binding the Ciprofloxacin and prevented complete dose of Ciprofloxacin from being absorbed. Family elected to discontinue iron and calcium. Dose of Ciprofloxacin increased to 250mg  BID. Victoria Lewis appeared more somnolent on 07/16/14 with tachycardia and tachypnea, leading to concerns for infection vs. PE. CXR showed no evidence of infiltrate or acute cardiopulmonary disease. Urinalysis was clear. She was started on Vancomycin and Zosyn, which were  continued until 07/18/14. Prednisone was initiated. Blood culture showed no growth to date. Urine cultures showed multiple bacterial morphotypes, none predominant.  D-dimer was 1.66. CT showed no evidence of PE. Victoria Lewis's clinical status continued to wax and wane from 11/7 to 11/8. Her antibiotics were de-escalated to ciprofloxacin on 07/18/14.  Family notes significant constipation x2 weeks. This was treated with Senna, Miralax, OMM, and enemas during hospitalization. Victoria Lewis was successful in having bowel movement on day prior to discharge.  Hyponatremia was initially noted on CMP at admission with Sodium of 131. This was monitored throughout hospitalization. This improved to 134 during hospitalization.  Issues for Follow Up:  - Iron and Calcium discontinued due to binding of Ciprofloxacin. She was discharged on Ciprofloxacin 250mg  BID with four days left in course. - Metoprolol discontinued. Norvasc 10mg  initiated. - Significantly constipated while at hospital. Follow-up status. - Follow up any further episodes of syncope. Loop recorder still in place; may evaluate for heart block.  Significant Procedures: None  Significant Labs and Imaging:   Recent Labs Lab 07/14/14 0630 07/16/14 1555 07/18/14 0015  WBC 6.4 5.8 6.4  HGB 12.8 12.2 11.2*  HCT 38.1 35.9* 33.8*  PLT 354 310 310    Recent Labs Lab 07/13/14 1445 07/14/14 0630 07/15/14 0905  NA 131* 134* 134*  K 4.3 4.2 4.5  CL 93* 96 100  CO2 26 22 18*  GLUCOSE 93 91 138*  BUN 18 19 16   CREATININE 1.09 1.16* 0.95  CALCIUM 10.3 9.9 8.8   Urinalysis    Component Value Date/Time   COLORURINE STRAW* 07/16/2014 1628   APPEARANCEUR CLEAR 07/16/2014 1628   LABSPEC 1.009 07/16/2014 1628   PHURINE 6.5 07/16/2014 1628   GLUCOSEU NEGATIVE 07/16/2014 1628   HGBUR NEGATIVE 07/16/2014 1628   BILIRUBINUR NEGATIVE 07/16/2014 1628   KETONESUR NEGATIVE  07/16/2014 1628   PROTEINUR NEGATIVE 07/16/2014 1628   UROBILINOGEN  0.2 07/16/2014 1628   NITRITE NEGATIVE 07/16/2014 1628   LEUKOCYTESUR NEGATIVE 07/16/2014 1628   - D-dimer- 1.66 - Troponins- negative  Ct Head Wo Contrast  07/13/2014   CLINICAL DATA:  Found passed out on the toilet. Reason UTI. History of syncopal episodes.  EXAM: CT HEAD WITHOUT CONTRAST  TECHNIQUE: Contiguous axial images were obtained from the base of the skull through the vertex without intravenous contrast.  COMPARISON:  11/29/2013  FINDINGS: There is diffuse cerebral atrophy. Severe chronic microvascular changes throughout the deep white matter. Associated ventriculomegaly of the lateral ventricles. No hemorrhage or hydrocephalus. No acute infarction or mass lesion. No midline shift.  Air-fluid levels noted in the sphenoid sinuses, similar to prior study. Mastoid air cells are clear.  IMPRESSION: Advanced atrophy and extensive chronic microvascular changes.  No acute intracranial abnormality.   Electronically Signed   By: Charlett Nose M.D.   On: 07/13/2014 13:13   Ct Angio Chest Pe W/cm &/or Wo Cm  07/17/2014   CLINICAL DATA:  Syncope,tachypnea, elevated D-dimer  EXAM: CT ANGIOGRAPHY CHEST WITH CONTRAST  TECHNIQUE: Multidetector CT imaging of the chest was performed using the standard protocol during bolus administration of intravenous contrast. Multiplanar CT image reconstructions and MIPs were obtained to evaluate the vascular anatomy.  CONTRAST:  56mL OMNIPAQUE IOHEXOL 350 MG/ML SOLN  COMPARISON:  Chest radiograph dated 07/16/2014  FINDINGS: No evidence of pulmonary embolism.  Trace bilateral pleural effusions. Elevation of the right hemidiaphragm with associated right basilar atelectasis. Mild left basilar atelectasis. Mild paraseptal emphysematous changes. No suspicious pulmonary nodules. No pneumothorax.  Visualized thyroid unremarkable.  Cardiomegaly. No pericardial effusion. Coronary atherosclerosis. Atherosclerotic calcifications of the aortic arch.  No suspicious mediastinal, hilar, or  axillary lymphadenopathy.  Moderate hiatal hernia.  Degenerative changes of the visualized thoracolumbar spine. Mild superior endplate changes at T6 (sagittal image 35).  Review of the MIP images confirms the above findings.  IMPRESSION: No evidence of pulmonary embolism.  Trace bilateral pleural effusions.   Electronically Signed   By: Charline Bills M.D.   On: 07/17/2014 18:24   Dg Chest Port 1 View  07/16/2014   CLINICAL DATA:  Tachycardia  EXAM: PORTABLE CHEST - 1 VIEW  COMPARISON:  03/21/2014  FINDINGS: Chronic interstitial markings/emphysematous changes. No focal consolidation. No pleural effusion or pneumothorax.  Eventration of the hemidiaphragm.  Heart is normal size.  Holter monitor overlies the left hemithorax.  IMPRESSION: No evidence of acute cardiopulmonary disease.   Electronically Signed   By: Charline Bills M.D.   On: 07/16/2014 15:34   Results/Tests Pending at Time of Discharge: Final Report of Blood Culture  Discharge Medications:    Medication List    STOP taking these medications        calcium carbonate 600 MG Tabs tablet  Commonly known as:  OS-CAL     ferrous sulfate 325 (65 FE) MG tablet     metoprolol 50 MG tablet  Commonly known as:  LOPRESSOR      TAKE these medications        acetaminophen 500 MG tablet  Commonly known as:  TYLENOL  Take 500 mg by mouth 4 (four) times daily.     ALIGN PO  Take 1 capsule by mouth daily.     amLODipine 10 MG tablet  Commonly known as:  NORVASC  Take 1 tablet (10 mg total) by mouth daily.     aspirin EC 81 MG  tablet  Take 81 mg by mouth daily.     ciprofloxacin 250 MG tablet  Commonly known as:  CIPRO  Take 1 tablet (250 mg total) by mouth 2 (two) times daily.     CRANBERRY PO  Take 240 mLs by mouth 3 (three) times daily. Cranberry concentrate solution     GAVISCON PO  Take 5 mLs by mouth 3 (three) times daily before meals.     omeprazole 20 MG capsule  Commonly known as:  PRILOSEC  Take 20 mg by mouth  daily.     predniSONE 20 MG tablet  Commonly known as:  DELTASONE  Take 1 tablet (20 mg total) by mouth daily with breakfast.     vitamin B-12 1000 MCG tablet  Commonly known as:  CYANOCOBALAMIN  Take 1,000 mcg by mouth daily.     Vitamin D 2000 UNITS Caps  Take 2,000 Units by mouth daily.        Discharge Instructions: Please refer to Patient Instructions section of EMR for full details.  Patient was counseled important signs and symptoms that should prompt return to medical care, changes in medications, dietary instructions, activity restrictions, and follow up appointments.   Follow-Up Appointments:     Follow-up Information    Follow up with [facility].   Why:  Physical Therapy, Registered Nurse and Aide   Contact information:   [address] [place] [address] [phone]       [facility]. Schedule an appointment as soon as possible for a visit in 3 days.   Specialty:  Internal Medicine   Contact information:   [address] [place] [address] [phone]       Araceli BoucheRaleigh N Rumley, DO 07/19/2014, 7:19 PM PGY-1, [facility]

## 2014-07-23 LAB — CULTURE, BLOOD (ROUTINE X 2)
Culture: NO GROWTH
Culture: NO GROWTH

## 2014-08-07 ENCOUNTER — Emergency Department (HOSPITAL_COMMUNITY)
Admission: EM | Admit: 2014-08-07 | Discharge: 2014-08-07 | Disposition: A | Discharge status: Home / Self Care | Payer: Medicare Other | Attending: Emergency Medicine | Admitting: Emergency Medicine

## 2014-08-07 ENCOUNTER — Encounter (HOSPITAL_COMMUNITY): Payer: Self-pay | Admitting: *Deleted

## 2014-08-07 ENCOUNTER — Emergency Department (HOSPITAL_COMMUNITY): Payer: Medicare Other

## 2014-08-07 DIAGNOSIS — Z7982 Long term (current) use of aspirin: Secondary | ICD-10-CM | POA: Diagnosis not present

## 2014-08-07 DIAGNOSIS — Z79899 Other long term (current) drug therapy: Secondary | ICD-10-CM | POA: Insufficient documentation

## 2014-08-07 DIAGNOSIS — R531 Weakness: Secondary | ICD-10-CM | POA: Insufficient documentation

## 2014-08-07 DIAGNOSIS — R5383 Other fatigue: Secondary | ICD-10-CM | POA: Diagnosis present

## 2014-08-07 DIAGNOSIS — N3000 Acute cystitis without hematuria: Secondary | ICD-10-CM | POA: Diagnosis not present

## 2014-08-07 DIAGNOSIS — Z87440 Personal history of urinary (tract) infections: Secondary | ICD-10-CM | POA: Insufficient documentation

## 2014-08-07 DIAGNOSIS — D649 Anemia, unspecified: Secondary | ICD-10-CM | POA: Diagnosis not present

## 2014-08-07 DIAGNOSIS — K219 Gastro-esophageal reflux disease without esophagitis: Secondary | ICD-10-CM | POA: Diagnosis not present

## 2014-08-07 DIAGNOSIS — M1990 Unspecified osteoarthritis, unspecified site: Secondary | ICD-10-CM | POA: Diagnosis not present

## 2014-08-07 DIAGNOSIS — I10 Essential (primary) hypertension: Secondary | ICD-10-CM | POA: Insufficient documentation

## 2014-08-07 LAB — URINALYSIS, ROUTINE W REFLEX MICROSCOPIC
BILIRUBIN URINE: NEGATIVE
GLUCOSE, UA: NEGATIVE mg/dL
HGB URINE DIPSTICK: NEGATIVE
KETONES UR: NEGATIVE mg/dL
Nitrite: POSITIVE — AB
PH: 6 (ref 5.0–8.0)
PROTEIN: NEGATIVE mg/dL
Specific Gravity, Urine: 1.009 (ref 1.005–1.030)
Urobilinogen, UA: 0.2 mg/dL (ref 0.0–1.0)

## 2014-08-07 LAB — I-STAT CG4 LACTIC ACID, ED: Lactic Acid, Venous: 2.24 mmol/L — ABNORMAL HIGH (ref 0.5–2.2)

## 2014-08-07 LAB — COMPREHENSIVE METABOLIC PANEL
ALK PHOS: 106 U/L (ref 39–117)
ALT: 20 U/L (ref 0–35)
AST: 24 U/L (ref 0–37)
Albumin: 3.5 g/dL (ref 3.5–5.2)
Anion gap: 17 — ABNORMAL HIGH (ref 5–15)
BILIRUBIN TOTAL: 0.3 mg/dL (ref 0.3–1.2)
BUN: 15 mg/dL (ref 6–23)
CHLORIDE: 94 meq/L — AB (ref 96–112)
CO2: 24 meq/L (ref 19–32)
Calcium: 10.1 mg/dL (ref 8.4–10.5)
Creatinine, Ser: 0.92 mg/dL (ref 0.50–1.10)
GFR, EST AFRICAN AMERICAN: 64 mL/min — AB (ref >90)
GFR, EST NON AFRICAN AMERICAN: 56 mL/min — AB (ref >90)
Glucose, Bld: 94 mg/dL (ref 70–99)
Potassium: 4.3 mEq/L (ref 3.7–5.3)
SODIUM: 135 meq/L — AB (ref 137–147)
Total Protein: 7.3 g/dL (ref 6.0–8.3)

## 2014-08-07 LAB — CBC WITH DIFFERENTIAL/PLATELET
BASOS ABS: 0.1 10*3/uL (ref 0.0–0.1)
Basophils Relative: 1 % (ref 0–1)
EOS PCT: 6 % — AB (ref 0–5)
Eosinophils Absolute: 0.4 10*3/uL (ref 0.0–0.7)
HCT: 38.9 % (ref 36.0–46.0)
Hemoglobin: 12.8 g/dL (ref 12.0–15.0)
LYMPHS ABS: 0.6 10*3/uL — AB (ref 0.7–4.0)
LYMPHS PCT: 9 % — AB (ref 12–46)
MCH: 31 pg (ref 26.0–34.0)
MCHC: 32.9 g/dL (ref 30.0–36.0)
MCV: 94.2 fL (ref 78.0–100.0)
Monocytes Absolute: 0.8 10*3/uL (ref 0.1–1.0)
Monocytes Relative: 10 % (ref 3–12)
NEUTROS ABS: 5.6 10*3/uL (ref 1.7–7.7)
Neutrophils Relative %: 74 % (ref 43–77)
PLATELETS: 381 10*3/uL (ref 150–400)
RBC: 4.13 MIL/uL (ref 3.87–5.11)
RDW: 13.4 % (ref 11.5–15.5)
WBC: 7.5 10*3/uL (ref 4.0–10.5)

## 2014-08-07 LAB — TROPONIN I: Troponin I: <0.3 ng/mL (ref <0.30)

## 2014-08-07 LAB — URINE MICROSCOPIC-ADD ON

## 2014-08-07 MED ORDER — DEXTROSE 5 % IV SOLN
1.0000 g | Freq: Once | INTRAVENOUS | Status: AC
  Administered 2014-08-07: 1 g via INTRAVENOUS
  Filled 2014-08-07: qty 10

## 2014-08-07 MED ORDER — CEPHALEXIN 500 MG PO CAPS: 500.0000 mg | ORAL_CAPSULE | Freq: Three times a day (TID) | ORAL | Status: DC

## 2014-08-07 MED ORDER — SODIUM CHLORIDE 0.9 % IV BOLUS (SEPSIS)
1000.0000 mL | Freq: Once | INTRAVENOUS | Status: AC
  Administered 2014-08-07: 1000 mL via INTRAVENOUS

## 2014-08-07 NOTE — ED Notes (Signed)
Family reports that pt has a UTI. Has hx of same and recent admission. Reports dark urine with foul odor and pts having fatigue and generalized weakness. Pt a&o.

## 2014-08-07 NOTE — ED Notes (Signed)
Patient returned from X-ray 

## 2014-08-07 NOTE — ED Provider Notes (Signed)
CSN: [id number]     Arrival date & time 08/07/14  [id number] History   First MD Initiated Contact with Patient 08/07/14 0914     Chief Complaint  Patient presents with  . Fatigue  . Urinary Tract Infection      HPI Patient presents to the emergency department complaining of generalized weakness and foul-smelling urine today.  Required more assistance today secondary to generalized weakness.  No focal weakness.  No reports of fevers or chills.  No reports nausea vomiting or diarrhea.  Family reports a history of recurrent urinary tract infections.  He also reports that her breathing seemed more heavy today than normal.  No reports a productive cough.  Patient denies shortness of breath at this time.  She denies back pain or chest pain.  She denies abdominal pain.  Intervals are mild in severity   Past Medical History  Diagnosis Date  . Hypertension   . GERD (gastroesophageal reflux disease)   . H/O hiatal hernia   . Arthritis   . Anemia     iron  . Syncope 07/13/2014  . UTI (lower urinary tract infection)    Past Surgical History  Procedure Laterality Date  . Joint replacement Left 2008  . Eye surgery Bilateral     cataracts   . Total hip arthroplasty Right 10/11/2013    Procedure: TOTAL HIP ARTHROPLASTY;  Surgeon: Nestor LewandowskyFrank J Rowan, MD;  Location: [facility] OR;  Service: Orthopedics;  Laterality: Right;   Family History  Problem Relation Age of Onset  . Stroke Mother 4167    died of stroke   History  Substance Use Topics  . Smoking status: Never Smoker   . Smokeless tobacco: Never Used  . Alcohol Use: No   OB History    No data available     Review of Systems  All other systems reviewed and are negative.     Allergies  Review of patient's allergies indicates no known allergies.  Home Medications   Prior to Admission medications   Medication Sig Start Date End Date Taking? Authorizing Provider  acetaminophen (TYLENOL) 500 MG tablet Take 500 mg by mouth 4 (four) times daily.    Yes Historical Provider, MD  Alum Hydroxide-Mag Carbonate (GAVISCON PO) Take 5 mLs by mouth 3 (three) times daily before meals.   Yes Historical Provider, MD  amLODipine (NORVASC) 10 MG tablet Take 1 tablet (10 mg total) by mouth daily. 07/18/14  Yes Elenora GammaSamuel L Bradshaw, MD  aspirin EC 81 MG tablet Take 81 mg by mouth daily.   Yes Historical Provider, MD  Cholecalciferol (VITAMIN D) 2000 UNITS CAPS Take 2,000 Units by mouth daily.   Yes Historical Provider, MD  CRANBERRY PO Take 240 mLs by mouth 3 (three) times daily. Cranberry concentrate solution   Yes Historical Provider, MD  gabapentin (NEURONTIN) 100 MG capsule Take 1 capsule by mouth 3 (three) times daily. 07/28/14  Yes Historical Provider, MD  omeprazole (PRILOSEC) 20 MG capsule Take 20 mg by mouth daily.   Yes Historical Provider, MD  Probiotic Product (ALIGN PO) Take 1 capsule by mouth daily.   Yes Historical Provider, MD  vitamin B-12 (CYANOCOBALAMIN) 1000 MCG tablet Take 1,000 mcg by mouth daily.   Yes Historical Provider, MD  ciprofloxacin (CIPRO) 250 MG tablet Take 1 tablet (250 mg total) by mouth 2 (two) times daily. Patient not taking: Reported on 08/07/2014 07/18/14   Elenora GammaSamuel L Bradshaw, MD  predniSONE (DELTASONE) 20 MG tablet Take 1 tablet (20 mg total) by  mouth daily with breakfast. Patient not taking: Reported on 08/07/2014 07/18/14   Elenora GammaSamuel L Bradshaw, MD   BP 144/74 mmHg  Pulse 91  Temp(Src) 98.2 F (36.8 C) (Oral)  Resp 20  SpO2 96% Physical Exam  Constitutional: She is oriented to person, place, and time. She appears well-developed and well-nourished. No distress.  HENT:  Head: Normocephalic and atraumatic.  Eyes: EOM are normal.  Neck: Normal range of motion.  Cardiovascular: Normal rate, regular rhythm and normal heart sounds.   Pulmonary/Chest: Effort normal and breath sounds normal.  Abdominal: Soft. She exhibits no distension. There is no tenderness.  Musculoskeletal: Normal range of motion.  Neurological: She is  alert and oriented to person, place, and time.  Skin: Skin is warm and dry.  Psychiatric: She has a normal mood and affect. Judgment normal.  Nursing note and vitals reviewed.   ED Course  Procedures (including critical care time) Labs Review Labs Reviewed  CBC WITH DIFFERENTIAL - Abnormal; Notable for the following:    Lymphocytes Relative 9 (*)    Lymphs Abs 0.6 (*)    Eosinophils Relative 6 (*)    All other components within normal limits  COMPREHENSIVE METABOLIC PANEL - Abnormal; Notable for the following:    Sodium 135 (*)    Chloride 94 (*)    GFR calc non Af Amer 56 (*)    GFR calc Af Amer 64 (*)    Anion gap 17 (*)    All other components within normal limits  URINALYSIS, ROUTINE W REFLEX MICROSCOPIC - Abnormal; Notable for the following:    Nitrite POSITIVE (*)    Leukocytes, UA SMALL (*)    All other components within normal limits  URINE MICROSCOPIC-ADD ON - Abnormal; Notable for the following:    Bacteria, UA MANY (*)    All other components within normal limits  I-STAT CG4 LACTIC ACID, ED - Abnormal; Notable for the following:    Lactic Acid, Venous 2.24 (*)    All other components within normal limits  URINE CULTURE  TROPONIN I    Imaging Review Dg Chest 2 View  08/07/2014   CLINICAL DATA:  Weakness.  History of hypertension.  EXAM: CHEST  2 VIEW  COMPARISON:  07/16/2014  FINDINGS: Chronic elevation of the right hemidiaphragm. Lungs are clear without airspace disease. Again noted is a implantable cardiac loop recorder overlying the left chest. Retrocardiac density is compatible with a hiatal hernia. Negative for a large pleural effusion. Chronic deformity in the thoracolumbar spine.  IMPRESSION: No acute chest findings.  Hiatal hernia.   Electronically Signed   By: Richarda OverlieAdam  Henn M.D.   On: 08/07/2014 11:31  I personally reviewed the imaging tests through PACS system I reviewed available ER/hospitalization records through the EMR    EKG Interpretation None       MDM   Final diagnoses:  Weakness  Acute cystitis without hematuria    12:24 PM Patient feels much better at this time.  Family agrees she is looking better.  Discharge home with PCP follow-up.  UTI as the cause.  Urine culture sent.  Rocephin in the emergency department.  Home with Keflex.  Patient and family understand to return to the ER for new or worsening symptoms    Lyanne CoKevin M Kenn Rekowski, MD 08/07/14 1224

## 2014-08-07 NOTE — Discharge Instructions (Signed)

## 2014-08-07 NOTE — ED Notes (Signed)
Patient to ED with C/O UTI.  Family states that she has chronic UTI's.  Family states that patient is weak and fatigued.  No LOC.  Family reports urine is dark and foul smelling.

## 2014-08-09 LAB — URINE CULTURE

## 2014-08-10 ENCOUNTER — Telehealth (HOSPITAL_BASED_OUTPATIENT_CLINIC_OR_DEPARTMENT_OTHER): Payer: Self-pay | Admitting: Emergency Medicine

## 2014-08-10 NOTE — Telephone Encounter (Signed)
Post ED Visit - Positive Culture Follow-up  Culture report reviewed by antimicrobial stewardship pharmacist: []  Wes Dulaney, Pharm.D., BCPS [x]  Celedonio MiyamotoJeremy Frens, Pharm.D., BCPS []  Georgina PillionElizabeth Martin, [address]Ph[address] BCPS []  [place]h Pham, [address]Ph[address]D., BCPS, AAHIVP []  Estella HuskMichelle Turner, Pharm.D., BCPS, AAHIVP []  Babs BertinHaley Baird, Pharm.D.   Positive urine culture E. Coli Treated with cephalexin, organism sensitive to the same and no further patient follow-up is required at this time.  Berle MullMiller, Bodi Palmeri 08/10/2014, 9:28 AM

## 2014-08-11 ENCOUNTER — Other Ambulatory Visit: Payer: Self-pay | Admitting: Family Medicine

## 2014-08-12 ENCOUNTER — Other Ambulatory Visit: Payer: Self-pay | Admitting: Family Medicine

## 2014-08-16 ENCOUNTER — Telehealth: Payer: Self-pay

## 2014-08-16 ENCOUNTER — Encounter (HOSPITAL_COMMUNITY): Payer: Self-pay | Admitting: Family Medicine

## 2014-08-16 ENCOUNTER — Emergency Department (HOSPITAL_COMMUNITY)
Admission: EM | Admit: 2014-08-16 | Discharge: 2014-08-16 | Disposition: A | Discharge status: Home / Self Care | Payer: Medicare Other | Attending: Emergency Medicine | Admitting: Emergency Medicine

## 2014-08-16 ENCOUNTER — Other Ambulatory Visit: Payer: Self-pay | Admitting: Family Medicine

## 2014-08-16 DIAGNOSIS — I10 Essential (primary) hypertension: Secondary | ICD-10-CM | POA: Diagnosis not present

## 2014-08-16 DIAGNOSIS — K219 Gastro-esophageal reflux disease without esophagitis: Secondary | ICD-10-CM | POA: Diagnosis not present

## 2014-08-16 DIAGNOSIS — Z79899 Other long term (current) drug therapy: Secondary | ICD-10-CM | POA: Diagnosis not present

## 2014-08-16 DIAGNOSIS — Z7982 Long term (current) use of aspirin: Secondary | ICD-10-CM | POA: Insufficient documentation

## 2014-08-16 DIAGNOSIS — M1990 Unspecified osteoarthritis, unspecified site: Secondary | ICD-10-CM | POA: Diagnosis not present

## 2014-08-16 DIAGNOSIS — R4182 Altered mental status, unspecified: Secondary | ICD-10-CM | POA: Diagnosis present

## 2014-08-16 DIAGNOSIS — R5383 Other fatigue: Secondary | ICD-10-CM | POA: Diagnosis not present

## 2014-08-16 DIAGNOSIS — Z87440 Personal history of urinary (tract) infections: Secondary | ICD-10-CM | POA: Insufficient documentation

## 2014-08-16 DIAGNOSIS — Z862 Personal history of diseases of the blood and blood-forming organs and certain disorders involving the immune mechanism: Secondary | ICD-10-CM | POA: Diagnosis not present

## 2014-08-16 DIAGNOSIS — R531 Weakness: Secondary | ICD-10-CM | POA: Diagnosis not present

## 2014-08-16 LAB — I-STAT CG4 LACTIC ACID, ED: LACTIC ACID, VENOUS: 1.72 mmol/L (ref 0.5–2.2)

## 2014-08-16 LAB — URINALYSIS, ROUTINE W REFLEX MICROSCOPIC
BILIRUBIN URINE: NEGATIVE
Glucose, UA: NEGATIVE mg/dL
HGB URINE DIPSTICK: NEGATIVE
Ketones, ur: NEGATIVE mg/dL
NITRITE: NEGATIVE
PH: 6.5 (ref 5.0–8.0)
Protein, ur: NEGATIVE mg/dL
SPECIFIC GRAVITY, URINE: 1.011 (ref 1.005–1.030)
UROBILINOGEN UA: 0.2 mg/dL (ref 0.0–1.0)

## 2014-08-16 LAB — COMPREHENSIVE METABOLIC PANEL
ALT: 17 U/L (ref 0–35)
AST: 21 U/L (ref 0–37)
Albumin: 3.3 g/dL — ABNORMAL LOW (ref 3.5–5.2)
Alkaline Phosphatase: 103 U/L (ref 39–117)
Anion gap: 14 (ref 5–15)
BUN: 15 mg/dL (ref 6–23)
CO2: 25 meq/L (ref 19–32)
CREATININE: 1.07 mg/dL (ref 0.50–1.10)
Calcium: 10.1 mg/dL (ref 8.4–10.5)
Chloride: 97 mEq/L (ref 96–112)
GFR calc Af Amer: 54 mL/min — ABNORMAL LOW (ref >90)
GFR, EST NON AFRICAN AMERICAN: 46 mL/min — AB (ref >90)
Glucose, Bld: 83 mg/dL (ref 70–99)
Potassium: 4.5 mEq/L (ref 3.7–5.3)
Sodium: 136 mEq/L — ABNORMAL LOW (ref 137–147)
Total Bilirubin: 0.2 mg/dL — ABNORMAL LOW (ref 0.3–1.2)
Total Protein: 6.9 g/dL (ref 6.0–8.3)

## 2014-08-16 LAB — URINE MICROSCOPIC-ADD ON

## 2014-08-16 LAB — CBC
HCT: 37.5 % (ref 36.0–46.0)
Hemoglobin: 12.6 g/dL (ref 12.0–15.0)
MCH: 32.1 pg (ref 26.0–34.0)
MCHC: 33.6 g/dL (ref 30.0–36.0)
MCV: 95.4 fL (ref 78.0–100.0)
PLATELETS: 383 10*3/uL (ref 150–400)
RBC: 3.93 MIL/uL (ref 3.87–5.11)
RDW: 13.1 % (ref 11.5–15.5)
WBC: 6.5 10*3/uL (ref 4.0–10.5)

## 2014-08-16 LAB — I-STAT TROPONIN, ED: TROPONIN I, POC: 0 ng/mL (ref 0.00–0.08)

## 2014-08-16 NOTE — Discharge Instructions (Signed)
Be sure to elevate her legs and keep socks off as much as possible. Continue antibiotic for UTI as prescribed.  Fatigue Fatigue is a feeling of tiredness, lack of energy, lack of motivation, or feeling tired all the time. Having enough rest, good nutrition, and reducing stress will normally reduce fatigue. Consult your caregiver if it persists. The nature of your fatigue will help your caregiver to find out its cause. The treatment is based on the cause.  CAUSES  There are many causes for fatigue. Most of the time, fatigue can be traced to one or more of your habits or routines. Most causes fit into one or more of three general areas. They are: Lifestyle problems  Sleep disturbances.  Overwork.  Physical exertion.  Unhealthy habits.  Poor eating habits or eating disorders.  Alcohol and/or drug use .  Lack of proper nutrition (malnutrition). Psychological problems  Stress and/or anxiety problems.  Depression.  Grief.  Boredom. Medical Problems or Conditions  Anemia.  Pregnancy.  Thyroid gland problems.  Recovery from major surgery.  Continuous pain.  Emphysema or asthma that is not well controlled  Allergic conditions.  Diabetes.  Infections (such as mononucleosis).  Obesity.  Sleep disorders, such as sleep apnea.  Heart failure or other heart-related problems.  Cancer.  Kidney disease.  Liver disease.  Effects of certain medicines such as antihistamines, cough and cold remedies, prescription pain medicines, heart and blood pressure medicines, drugs used for treatment of cancer, and some antidepressants. SYMPTOMS  The symptoms of fatigue include:   Lack of energy.  Lack of drive (motivation).  Drowsiness.  Feeling of indifference to the surroundings. DIAGNOSIS  The details of how you feel help guide your caregiver in finding out what is causing the fatigue. You will be asked about your present and past health condition. It is important to  review all medicines that you take, including prescription and non-prescription items. A thorough exam will be done. You will be questioned about your feelings, habits, and normal lifestyle. Your caregiver may suggest blood tests, urine tests, or other tests to look for common medical causes of fatigue.  TREATMENT  Fatigue is treated by correcting the underlying cause. For example, if you have continuous pain or depression, treating these causes will improve how you feel. Similarly, adjusting the dose of certain medicines will help in reducing fatigue.  HOME CARE INSTRUCTIONS   Try to get the required amount of good sleep every night.  Eat a healthy and nutritious diet, and drink enough water throughout the day.  Practice ways of relaxing (including yoga or meditation).  Exercise regularly.  Make plans to change situations that cause stress. Act on those plans so that stresses decrease over time. Keep your work and personal routine reasonable.  Avoid street drugs and minimize use of alcohol.  Start taking a daily multivitamin after consulting your caregiver. SEEK MEDICAL CARE IF:   You have persistent tiredness, which cannot be accounted for.  You have fever.  You have unintentional weight loss.  You have headaches.  You have disturbed sleep throughout the night.  You are feeling sad.  You have constipation.  You have dry skin.  You have gained weight.  You are taking any new or different medicines that you suspect are causing fatigue.  You are unable to sleep at night.  You develop any unusual swelling of your legs or other parts of your body. SEEK IMMEDIATE MEDICAL CARE IF:   You are feeling confused.  Your vision  is blurred.  You feel faint or pass out.  You develop severe headache.  You develop severe abdominal, pelvic, or back pain.  You develop chest pain, shortness of breath, or an irregular or fast heartbeat.  You are unable to pass a normal amount of  urine.  You develop abnormal bleeding such as bleeding from the rectum or you vomit blood.  You have thoughts about harming yourself or committing suicide.  You are worried that you might harm someone else. MAKE SURE YOU:   Understand these instructions.  Will watch your condition.  Will get help right away if you are not doing well or get worse. Document Released: 06/23/2007 Document Revised: 11/18/2011 Document Reviewed: 12/28/2013 [facility] Patient Information 2015 [employer] [place]. This information is not intended to replace advice given to you by your health care provider. Make sure you discuss any questions you have with your health care provider.

## 2014-08-16 NOTE — ED Provider Notes (Signed)
CSN: [id number]     Arrival date & time 08/16/14  [id number] History   First MD Initiated Contact with Patient 08/16/14 1028     Chief Complaint  Patient presents with  . Altered Mental Status     (Consider location/radiation/quality/duration/timing/severity/associated sxs/prior Treatment) HPI Comments: 80 year old female with a past medical history of hypertension, GERD, arthritis, anemia and urinary tract infections presenting with her daughter from home with concerns of continued urinary tract infection. Patient lives at home with her husband, daughter lives next door. Daughter reports yesterday evening patient was feeling very fatigued and having difficulty getting in and out of bed, continuing into this morning. Her daughter states when she has a urinary tract infection or other illnesses is how she tends to act. She is at her baseline mentation. Patient has not been complaining of any urinary symptoms. No fevers, nausea, vomiting or diarrhea. She was diagnosed with urinary tract infection on November 29 and prescribed Keflex. On chart review, it is noted that the urine culture is sensitive to Keflex.  Patient is a 80 y.o. female presenting with altered mental status. The history is provided by a relative and the patient.  Altered Mental Status Associated symptoms: weakness     Past Medical History  Diagnosis Date  . Hypertension   . GERD (gastroesophageal reflux disease)   . H/O hiatal hernia   . Arthritis   . Anemia     iron  . Syncope 07/13/2014  . UTI (lower urinary tract infection)    Past Surgical History  Procedure Laterality Date  . Joint replacement Left 2008  . Eye surgery Bilateral     cataracts   . Total hip arthroplasty Right 10/11/2013    Procedure: TOTAL HIP ARTHROPLASTY;  Surgeon: Nestor LewandowskyFrank J Rowan, MD;  Location: [facility] OR;  Service: Orthopedics;  Laterality: Right;   Family History  Problem Relation Age of Onset  . Stroke Mother 1467    died of stroke   History   Substance Use Topics  . Smoking status: Never Smoker   . Smokeless tobacco: Never Used  . Alcohol Use: No   OB History    No data available     Review of Systems  Neurological: Positive for weakness.  All other systems reviewed and are negative.     Allergies  Review of patient's allergies indicates no known allergies.  Home Medications   Prior to Admission medications   Medication Sig Start Date End Date Taking? Authorizing Provider  acetaminophen (TYLENOL) 500 MG tablet Take 500 mg by mouth 4 (four) times daily.   Yes Historical Provider, MD  Alum Hydroxide-Mag Carbonate (GAVISCON PO) Take 5 mLs by mouth 3 (three) times daily before meals.   Yes Historical Provider, MD  amLODipine (NORVASC) 10 MG tablet Take 1 tablet (10 mg total) by mouth daily. 07/18/14  Yes Elenora GammaSamuel L Bradshaw, MD  aspirin EC 81 MG tablet Take 81 mg by mouth daily.   Yes Historical Provider, MD  cephALEXin (KEFLEX) 500 MG capsule Take 1 capsule (500 mg total) by mouth 3 (three) times daily. 08/07/14  Yes Lyanne CoKevin M Campos, MD  Cholecalciferol (VITAMIN D) 2000 UNITS CAPS Take 2,000 Units by mouth daily.   Yes Historical Provider, MD  CRANBERRY PO Take 240 mLs by mouth 3 (three) times daily. Cranberry concentrate solution   Yes Historical Provider, MD  gabapentin (NEURONTIN) 100 MG capsule Take 1 capsule by mouth 3 (three) times daily. 07/28/14  Yes Historical Provider, MD  omeprazole (PRILOSEC) 20 MG  capsule Take 20 mg by mouth daily.   Yes Historical Provider, MD  Probiotic Product (ALIGN PO) Take 1 capsule by mouth daily.   Yes Historical Provider, MD  vitamin B-12 (CYANOCOBALAMIN) 1000 MCG tablet Take 1,000 mcg by mouth daily.   Yes Historical Provider, MD  ciprofloxacin (CIPRO) 250 MG tablet Take 1 tablet (250 mg total) by mouth 2 (two) times daily. Patient not taking: Reported on 08/07/2014 07/18/14   Elenora GammaSamuel L Bradshaw, MD  predniSONE (DELTASONE) 20 MG tablet Take 1 tablet (20 mg total) by mouth daily with  breakfast. Patient not taking: Reported on 08/07/2014 07/18/14   Elenora GammaSamuel L Bradshaw, MD   BP 149/69 mmHg  Pulse 83  Temp(Src) 97.7 F (36.5 C) (Oral)  Resp 16  SpO2 98% Physical Exam  Constitutional: She appears well-developed and well-nourished. No distress.  HENT:  Head: Normocephalic and atraumatic.  Mouth/Throat: Oropharynx is clear and moist.  Eyes: Conjunctivae are normal.  Neck: Normal range of motion. Neck supple.  Cardiovascular: Normal rate, regular rhythm and normal heart sounds.   Pulmonary/Chest: Effort normal and breath sounds normal.  Abdominal: Soft. Bowel sounds are normal. She exhibits no distension and no mass. There is no tenderness. There is no rebound and no guarding.  Musculoskeletal: Normal range of motion. She exhibits no edema.  Neurological: She is alert.  Alert and oriented to person and place. Normal per daughter.  Skin: Skin is warm and dry. She is not diaphoretic.  Minimal pinkness to right lower extremity. No warmth.  Psychiatric: She has a normal mood and affect. Her behavior is normal.  Nursing note and vitals reviewed.   ED Course  Procedures (including critical care time) Labs Review Labs Reviewed  URINALYSIS, ROUTINE W REFLEX MICROSCOPIC - Abnormal; Notable for the following:    Leukocytes, UA TRACE (*)    All other components within normal limits  COMPREHENSIVE METABOLIC PANEL - Abnormal; Notable for the following:    Sodium 136 (*)    Albumin 3.3 (*)    Total Bilirubin 0.2 (*)    GFR calc non Af Amer 46 (*)    GFR calc Af Amer 54 (*)    All other components within normal limits  URINE CULTURE  CBC  URINE MICROSCOPIC-ADD ON  I-STAT CG4 LACTIC ACID, ED  I-STAT TROPOININ, ED    Imaging Review No results found.   EKG Interpretation None      MDM   Final diagnoses:  Other fatigue   Patient well-appearing and in no apparent distress. Afebrile, vital signs stable. Alert and oriented at her baseline, normal mentation per  family. Urinalysis appears to be improving with Keflex. Regarding minimal pinkness to right lower extremity, doubt cellulitis. Discussed elevation and removal of socks, and to return with worsening symptoms. Advised rest and hydration. Follow-up with PCP. Workup otherwise negative. She is stable for discharge. Return precautions given. Patient and family state understanding of treatment care plan and are agreeable.  Discussed with attending Dr. Donnald GarrePfeiffer who also evaluated patient and agrees with plan of care.    Kathrynn SpeedRobyn M Tyresha Fede, PA-C 08/16/14 1313  Arby BarretteMarcy Pfeiffer, MD 08/16/14 [phone]

## 2014-08-16 NOTE — ED Notes (Signed)
Pt presents via POV with daughter with c/o altered mental status. Pt was recently seen and treated for a UTI. Daughter reports patient began to improve but yesterday around 1600 began acting more confused and became too weak to stand/ambulate.  Pt's daughter concerned that pt's UTI has worsened.

## 2014-08-16 NOTE — ED Notes (Signed)
Pt is leaving with family and will be returning home. Pt was escorted from the ED by this RN.

## 2014-08-17 ENCOUNTER — Other Ambulatory Visit: Payer: Self-pay

## 2014-08-17 MED ORDER — AMLODIPINE BESYLATE 10 MG PO TABS: 10.0000 mg | ORAL_TABLET | Freq: Every day | ORAL | Status: DC

## 2014-08-17 NOTE — Telephone Encounter (Signed)
See consult note by Dr Tresa EndoKelly in hopsital 11/4  Needs f/u with him in 3 months Ok to call in amlodipine

## 2014-08-19 LAB — URINE CULTURE: Colony Count: 60000

## 2014-08-30 ENCOUNTER — Encounter (HOSPITAL_COMMUNITY): Payer: Self-pay | Admitting: Emergency Medicine

## 2014-08-30 ENCOUNTER — Inpatient Hospital Stay (HOSPITAL_COMMUNITY)
Admission: EM | Admit: 2014-08-30 | Discharge: 2014-09-01 | DRG: 690 | Disposition: A | Discharge status: Home Health | Payer: Medicare Other | Attending: Family Medicine | Admitting: Family Medicine

## 2014-08-30 DIAGNOSIS — I129 Hypertensive chronic kidney disease with stage 1 through stage 4 chronic kidney disease, or unspecified chronic kidney disease: Secondary | ICD-10-CM | POA: Diagnosis present

## 2014-08-30 DIAGNOSIS — Z823 Family history of stroke: Secondary | ICD-10-CM | POA: Diagnosis not present

## 2014-08-30 DIAGNOSIS — F0390 Unspecified dementia without behavioral disturbance: Secondary | ICD-10-CM | POA: Diagnosis present

## 2014-08-30 DIAGNOSIS — B9689 Other specified bacterial agents as the cause of diseases classified elsewhere: Secondary | ICD-10-CM | POA: Diagnosis present

## 2014-08-30 DIAGNOSIS — Z79899 Other long term (current) drug therapy: Secondary | ICD-10-CM | POA: Diagnosis not present

## 2014-08-30 DIAGNOSIS — Z7982 Long term (current) use of aspirin: Secondary | ICD-10-CM | POA: Diagnosis not present

## 2014-08-30 DIAGNOSIS — N390 Urinary tract infection, site not specified: Secondary | ICD-10-CM | POA: Diagnosis present

## 2014-08-30 DIAGNOSIS — B952 Enterococcus as the cause of diseases classified elsewhere: Secondary | ICD-10-CM

## 2014-08-30 DIAGNOSIS — N183 Chronic kidney disease, stage 3 (moderate): Secondary | ICD-10-CM | POA: Diagnosis present

## 2014-08-30 DIAGNOSIS — K219 Gastro-esophageal reflux disease without esophagitis: Secondary | ICD-10-CM | POA: Diagnosis present

## 2014-08-30 DIAGNOSIS — A491 Streptococcal infection, unspecified site: Secondary | ICD-10-CM | POA: Diagnosis present

## 2014-08-30 DIAGNOSIS — R000 Tachycardia, unspecified: Secondary | ICD-10-CM | POA: Diagnosis present

## 2014-08-30 DIAGNOSIS — A498 Other bacterial infections of unspecified site: Secondary | ICD-10-CM

## 2014-08-30 DIAGNOSIS — Z96641 Presence of right artificial hip joint: Secondary | ICD-10-CM | POA: Diagnosis present

## 2014-08-30 DIAGNOSIS — Z1621 Resistance to vancomycin: Secondary | ICD-10-CM

## 2014-08-30 DIAGNOSIS — E860 Dehydration: Secondary | ICD-10-CM | POA: Insufficient documentation

## 2014-08-30 HISTORY — DX: Chronic kidney disease, stage 3 unspecified: N18.30

## 2014-08-30 HISTORY — DX: Other chronic cystitis without hematuria: N30.20

## 2014-08-30 HISTORY — DX: Chronic kidney disease, stage 3 (moderate): N18.3

## 2014-08-30 HISTORY — DX: Unspecified dementia, unspecified severity, without behavioral disturbance, psychotic disturbance, mood disturbance, and anxiety: F03.90

## 2014-08-30 LAB — COMPREHENSIVE METABOLIC PANEL
ALT: 15 U/L (ref 0–35)
ANION GAP: 11 (ref 5–15)
AST: 26 U/L (ref 0–37)
Albumin: 3.8 g/dL (ref 3.5–5.2)
Alkaline Phosphatase: 88 U/L (ref 39–117)
BILIRUBIN TOTAL: 0.6 mg/dL (ref 0.3–1.2)
BUN: 14 mg/dL (ref 6–23)
CALCIUM: 9.8 mg/dL (ref 8.4–10.5)
CHLORIDE: 102 meq/L (ref 96–112)
CO2: 24 mmol/L (ref 19–32)
CREATININE: 1.14 mg/dL — AB (ref 0.50–1.10)
GFR calc non Af Amer: 43 mL/min — ABNORMAL LOW (ref >90)
GFR, EST AFRICAN AMERICAN: 50 mL/min — AB (ref >90)
Glucose, Bld: 87 mg/dL (ref 70–99)
Potassium: 4.4 mmol/L (ref 3.5–5.1)
Sodium: 137 mmol/L (ref 135–145)
Total Protein: 6.5 g/dL (ref 6.0–8.3)

## 2014-08-30 LAB — CBC WITH DIFFERENTIAL/PLATELET
Basophils Absolute: 0.2 10*3/uL — ABNORMAL HIGH (ref 0.0–0.1)
Basophils Relative: 3 % — ABNORMAL HIGH (ref 0–1)
Eosinophils Absolute: 0.6 10*3/uL (ref 0.0–0.7)
Eosinophils Relative: 8 % — ABNORMAL HIGH (ref 0–5)
HEMATOCRIT: 39.4 % (ref 36.0–46.0)
Hemoglobin: 12.9 g/dL (ref 12.0–15.0)
LYMPHS ABS: 1 10*3/uL (ref 0.7–4.0)
Lymphocytes Relative: 14 % (ref 12–46)
MCH: 30.5 pg (ref 26.0–34.0)
MCHC: 32.7 g/dL (ref 30.0–36.0)
MCV: 93.1 fL (ref 78.0–100.0)
MONO ABS: 0.7 10*3/uL (ref 0.1–1.0)
MONOS PCT: 10 % (ref 3–12)
NEUTROS ABS: 4.4 10*3/uL (ref 1.7–7.7)
Neutrophils Relative %: 65 % (ref 43–77)
Platelets: 348 10*3/uL (ref 150–400)
RBC: 4.23 MIL/uL (ref 3.87–5.11)
RDW: 13.4 % (ref 11.5–15.5)
WBC: 6.9 10*3/uL (ref 4.0–10.5)

## 2014-08-30 LAB — URINALYSIS, ROUTINE W REFLEX MICROSCOPIC
BILIRUBIN URINE: NEGATIVE
GLUCOSE, UA: NEGATIVE mg/dL
HGB URINE DIPSTICK: NEGATIVE
Ketones, ur: NEGATIVE mg/dL
Nitrite: POSITIVE — AB
PROTEIN: NEGATIVE mg/dL
Specific Gravity, Urine: 1.01 (ref 1.005–1.030)
UROBILINOGEN UA: 0.2 mg/dL (ref 0.0–1.0)
pH: 6.5 (ref 5.0–8.0)

## 2014-08-30 LAB — CK: Total CK: 131 U/L (ref 7–177)

## 2014-08-30 LAB — URINE MICROSCOPIC-ADD ON

## 2014-08-30 MED ORDER — SODIUM CHLORIDE 0.9 % IJ SOLN
3.0000 mL | Freq: Two times a day (BID) | INTRAMUSCULAR | Status: DC
  Administered 2014-08-30 – 2014-09-01 (×3): 3 mL via INTRAVENOUS

## 2014-08-30 MED ORDER — SODIUM CHLORIDE 0.9 % IJ SOLN: 3.0000 mL | INTRAMUSCULAR | Status: DC | PRN

## 2014-08-30 MED ORDER — LINEZOLID 2 MG/ML IV SOLN
600.0000 mg | Freq: Two times a day (BID) | INTRAVENOUS | Status: DC
  Administered 2014-08-30 – 2014-09-01 (×5): 600 mg via INTRAVENOUS
  Filled 2014-08-30 (×7): qty 300

## 2014-08-30 MED ORDER — GABAPENTIN 100 MG PO CAPS
100.0000 mg | ORAL_CAPSULE | Freq: Three times a day (TID) | ORAL | Status: DC
  Administered 2014-08-30 – 2014-09-01 (×6): 100 mg via ORAL
  Filled 2014-08-30 (×8): qty 1

## 2014-08-30 MED ORDER — PANTOPRAZOLE SODIUM 40 MG PO TBEC
40.0000 mg | DELAYED_RELEASE_TABLET | Freq: Every day | ORAL | Status: DC
  Administered 2014-08-30 – 2014-09-01 (×3): 40 mg via ORAL
  Filled 2014-08-30 (×3): qty 1

## 2014-08-30 MED ORDER — VITAMIN B-12 1000 MCG PO TABS
1000.0000 ug | ORAL_TABLET | Freq: Every day | ORAL | Status: DC
  Administered 2014-08-30 – 2014-09-01 (×3): 1000 ug via ORAL
  Filled 2014-08-30 (×3): qty 1

## 2014-08-30 MED ORDER — SODIUM CHLORIDE 0.9 % IV BOLUS (SEPSIS)
500.0000 mL | Freq: Once | INTRAVENOUS | Status: AC
  Administered 2014-08-30: 500 mL via INTRAVENOUS

## 2014-08-30 MED ORDER — SODIUM CHLORIDE 0.9 % IV SOLN
INTRAVENOUS | Status: AC
  Administered 2014-08-30: 18:00:00 via INTRAVENOUS

## 2014-08-30 MED ORDER — SENNA 8.6 MG PO TABS
1.0000 | ORAL_TABLET | Freq: Two times a day (BID) | ORAL | Status: DC
  Administered 2014-08-30 – 2014-09-01 (×5): 8.6 mg via ORAL
  Filled 2014-08-30 (×6): qty 1

## 2014-08-30 MED ORDER — HYDROCORTISONE 1 % EX OINT
TOPICAL_OINTMENT | Freq: Two times a day (BID) | CUTANEOUS | Status: DC
  Administered 2014-08-30 – 2014-09-01 (×4): via TOPICAL
  Filled 2014-08-30: qty 28.35

## 2014-08-30 MED ORDER — VITAMIN D 1000 UNITS PO TABS
2000.0000 [IU] | ORAL_TABLET | Freq: Every day | ORAL | Status: DC
  Administered 2014-08-30 – 2014-09-01 (×3): 2000 [IU] via ORAL
  Filled 2014-08-30 (×3): qty 2

## 2014-08-30 MED ORDER — AMLODIPINE BESYLATE 10 MG PO TABS
10.0000 mg | ORAL_TABLET | Freq: Every day | ORAL | Status: DC
  Administered 2014-08-30 – 2014-09-01 (×3): 10 mg via ORAL
  Filled 2014-08-30 (×3): qty 1

## 2014-08-30 MED ORDER — SODIUM CHLORIDE 0.9 % IV SOLN: 250.0000 mL | INTRAVENOUS | Status: DC | PRN

## 2014-08-30 MED ORDER — POLYETHYLENE GLYCOL 3350 17 G PO PACK
17.0000 g | PACK | Freq: Every day | ORAL | Status: DC | PRN
  Filled 2014-08-30: qty 1

## 2014-08-30 MED ORDER — ASPIRIN EC 81 MG PO TBEC
81.0000 mg | DELAYED_RELEASE_TABLET | Freq: Every day | ORAL | Status: DC
  Administered 2014-08-30 – 2014-09-01 (×3): 81 mg via ORAL
  Filled 2014-08-30 (×3): qty 1

## 2014-08-30 MED ORDER — ENOXAPARIN SODIUM 40 MG/0.4ML ~~LOC~~ SOLN
40.0000 mg | SUBCUTANEOUS | Status: DC
  Administered 2014-08-30 – 2014-08-31 (×2): 40 mg via SUBCUTANEOUS
  Filled 2014-08-30 (×3): qty 0.4

## 2014-08-30 NOTE — ED Notes (Signed)
Lab at bedside to draw labs.

## 2014-08-30 NOTE — ED Notes (Signed)
Per MD, pt is able to eat at this time. Tray ordered.

## 2014-08-30 NOTE — Progress Notes (Signed)
Admission note:   Arrival Method: Via stretcher from ED at approx 1530. Mental Status: A&OX2. Telemetry: N/A. Skin: Intact. Buttocks/sacrum is reddened and barely blanches; pink foam dressing applied. BLE with ecchymotic areas noted.  Tubes: N/A. IV: RH PIV dated 08/30/14. Pain: Denies. Family: Family at bedside. Living Situation: From home. Safety Measures: Bed alarm in place, call bell within reach, placed in camera room. 6E Orientation: Oriented to unit and surroundings.  Leanna BattlesEckelmann, Oreste Majeed Eileen, RN.

## 2014-08-30 NOTE — ED Notes (Signed)
Report given to 6E RN. Tech to transport.

## 2014-08-30 NOTE — ED Notes (Signed)
Pt son brings mother in for suspected UTI. Son states this has been a problem in the past. Pt has hx of dementia, no change from baseline according to son. Pt son states family reported her urine to have foul smell as of this morning. Pt in no apparent distress. Resting comfortably. Monitored by sp02 and bp cuff.

## 2014-08-30 NOTE — ED Notes (Signed)
Pt undressed, in gown, on continuous pulse oximetry and blood pressure cuff; son at bedside

## 2014-08-30 NOTE — ED Provider Notes (Signed)
CSN: [id number]     Arrival date & time 08/30/14  [id number] History   First MD Initiated Contact with Patient 08/30/14 [phone]     Chief Complaint  Patient presents with  . Urinary Tract Infection    Level V caveat due to dementia. (Consider location/radiation/quality/duration/timing/severity/associated sxs/prior Treatment) Patient is a 80 y.o. female presenting with urinary tract infection. The history is provided by the patient.  Urinary Tract Infection This is a recurrent problem.   The patient was brought in by her son for change in the smell of her urine. She's had repeat urinary tract infections. She is still acting at her baseline. She lives at home with her son. No recent hospital admissions. She has recently been on Keflex for UTI. She was found to be tachycardic in the ER. She is at her baseline dementia.   Past Medical History  Diagnosis Date  . Hypertension   . GERD (gastroesophageal reflux disease)   . H/O hiatal hernia   . Arthritis   . Anemia     iron  . Syncope 07/13/2014  . UTI (lower urinary tract infection)    Past Surgical History  Procedure Laterality Date  . Joint replacement Left 2008  . Eye surgery Bilateral     cataracts   . Total hip arthroplasty Right 10/11/2013    Procedure: TOTAL HIP ARTHROPLASTY;  Surgeon: Nestor LewandowskyFrank J Rowan, MD;  Location: [facility] OR;  Service: Orthopedics;  Laterality: Right;   Family History  Problem Relation Age of Onset  . Stroke Mother 8067    died of stroke   History  Substance Use Topics  . Smoking status: Never Smoker   . Smokeless tobacco: Never Used  . Alcohol Use: No   OB History    No data available     Review of Systems  Unable to perform ROS     Allergies  Review of patient's allergies indicates no known allergies.  Home Medications   Prior to Admission medications   Medication Sig Start Date End Date Taking? Authorizing Provider  acetaminophen (TYLENOL) 500 MG tablet Take 500 mg by mouth 4 (four) times daily.    Yes Historical Provider, MD  Alum Hydroxide-Mag Carbonate (GAVISCON PO) Take 5 mLs by mouth 3 (three) times daily before meals.   Yes Historical Provider, MD  amLODipine (NORVASC) 10 MG tablet Take 1 tablet (10 mg total) by mouth daily. 08/17/14  Yes Lennette Biharihomas A Kelly, MD  aspirin EC 81 MG tablet Take 81 mg by mouth daily.   Yes Historical Provider, MD  Cholecalciferol (VITAMIN D) 2000 UNITS CAPS Take 2,000 Units by mouth daily.   Yes Historical Provider, MD  CRANBERRY PO Take 240 mLs by mouth 3 (three) times daily. Cranberry concentrate solution   Yes Historical Provider, MD  gabapentin (NEURONTIN) 100 MG capsule Take 1 capsule by mouth 3 (three) times daily. 07/28/14  Yes Historical Provider, MD  omeprazole (PRILOSEC) 20 MG capsule Take 20 mg by mouth daily.   Yes Historical Provider, MD  Probiotic Product (ALIGN PO) Take 1 capsule by mouth daily.   Yes Historical Provider, MD  vitamin B-12 (CYANOCOBALAMIN) 1000 MCG tablet Take 1,000 mcg by mouth daily.   Yes Historical Provider, MD   BP 126/59 mmHg  Pulse 92  Temp(Src) 98.3 F (36.8 C) (Oral)  Resp 20  Wt 138 lb 12.8 oz (62.959 kg)  SpO2 95% Physical Exam  Constitutional: She appears well-developed.  HENT:  Head: Normocephalic.  Eyes: Pupils are equal, round, and reactive  to light.  Neck: Normal range of motion.  Cardiovascular: Regular rhythm.   Tachycardia  Pulmonary/Chest: Effort normal.  Abdominal: Soft. There is no tenderness.  Musculoskeletal: Normal range of motion.  Neurological: She is alert.  Pleasantly demented, at her baseline per son.  Skin: Skin is warm.    ED Course  Procedures (including critical care time) Labs Review Labs Reviewed  URINALYSIS, ROUTINE W REFLEX MICROSCOPIC - Abnormal; Notable for the following:    Nitrite POSITIVE (*)    Leukocytes, UA MODERATE (*)    All other components within normal limits  CBC WITH DIFFERENTIAL - Abnormal; Notable for the following:    Eosinophils Relative 8 (*)     Basophils Relative 3 (*)    Basophils Absolute 0.2 (*)    All other components within normal limits  COMPREHENSIVE METABOLIC PANEL - Abnormal; Notable for the following:    Creatinine, Ser 1.14 (*)    GFR calc non Af Amer 43 (*)    GFR calc Af Amer 50 (*)    All other components within normal limits  URINE MICROSCOPIC-ADD ON - Abnormal; Notable for the following:    Squamous Epithelial / LPF FEW (*)    Bacteria, UA MANY (*)    All other components within normal limits  URINE CULTURE  CULTURE, BLOOD (ROUTINE X 2)  CULTURE, BLOOD (ROUTINE X 2)  CBC WITH DIFFERENTIAL  CBC    Imaging Review No results found.   EKG Interpretation None      MDM   Final diagnoses:  Lower urinary tract infection  Tachycardia    Patient with foul-smelling urine. Also tachycardia. Previous urine culture showed VRE. No dysuria no white count no fevers. Discussed with Dr. Luciana Axeomer, who stated it would reasonable to either treat with linezolid or just monitor. Will admit to hospital.    Juliet RudeNathan R. Rubin PayorPickering, MD 08/30/14 [phone]

## 2014-08-30 NOTE — ED Notes (Signed)
Multiple attempts made to draw blood by multiple nurses. IV start complete, however unable to draw blood off IV. Will contact lab.

## 2014-08-30 NOTE — ED Notes (Signed)
Myself and Pete GlatterHolley, RN cleaned pt, changed her gown and stretcher linens; placed pt on a clean dry diaper and placed 2 chuks underneath her; son stepped out and then back in when we finished; pt now resting and son at bedside

## 2014-08-30 NOTE — ED Notes (Signed)
Pt linens changed. Gown changed. Resting comfortably.

## 2014-08-30 NOTE — H&P (Signed)
Family Medicine Teaching [facility] Admission History and Physical Service Pager: [phone]  [name] Medical record number: [id number] Date of birth: 1927-11-17 Age: 80 y.o. Gender: female  Primary Care Provider: Paulina Fusi, MD Consultants: Infectious Disease (on phone) Code Status: Full (per notes from last visit. Son requests we discuss code status with his sisters when they arrive.)  Chief Complaint: UTI, VRE  Assessment and Plan: Victoria Lewis is a 80 y.o. female presenting with UTI with urine culture positive for VRE from 08/16/14. PMH is significant for dementia, CKD stage III,  Chronic bladder infection, HTN, GERD, and Anemia.  # UTI: Presented with AMS and diagnosed with UTI on 12/8 and given Keflex. One culture from 12/8 grew VRE while second culture grew E. Coli. Presents with foul odor to urine. No changes in mental status (dementia at baseline). - WBC 6.9. Tachycardic at 126. Respiration 25 x1 (was otherwise normal) - Urinalysis positive for nitrites, moderate leukocytes, few epithelial cells, and many bacteria. - Repeat Urine Cultures - Obtain Blood Cultures x2. Obtain prior to antibiotics. - Infectious Disease consulted. Recommends Linezolid 600mg  BID - Will monitor vitals and WBC - Pending Cx and repeat sensitivities, as long as not bacteremic, should be able to be switch to PO Zyvox for d/c for 14 day course of medication.    # CKD Stage III: Creatinine 1.14 today. Baseline creatinine 0.9-1.2 - Currently at baseline. - Continue to monitor. Will obtain BMP in am.  # Hypertension: Home medications include Amlodipine 10mg  - Continue Amlodipine - Well controlled. BP 101-148 / 63-91 in ED - Goal <140/90  # GERD: Home Medications include Gaviscon, Omeprazole 20mg  - Pantoprazole 40mg   # H/O Complete Heart Block - Resolved during last hospitalization with change in blood pressure medications - Loop recorder in place. 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  EKG obtained slightly thereafter shows sinus tachycardia, pt ASx.  Monitor closely for any changes  # H/O Constipation - Denies constipation at this time. Noted to become altered with constipation at last hospitalization. Will treat PRN to prevent AMS. - Miralax, Sennakot  FEN/GI: Regular Diet. NS @ 125 Prophylaxis: Continue Aspirin 81mg . Lovenox.  Disposition: Admitted to [facility] Medicine Teaching Service, [facility] attending. Placed in Step Down Unit.  History of Present Illness: Victoria Lewis is a 80 y.o. female presenting with UTI. No change in behavior from baseline. Urine noted to have foul smell this morning, but denies burning with urination, frequency, and increased urgency. States was recently treated for UTI, however no new antibiotics were given upon presentation to ED on 12/8. Chart review shows urine cultures collected at that time grew VRE x1 and E. Coli x1. Denies constipation. Denies fever. Denies abdominal pain.  Has history of complete heart block which resolved with change of medications during last hospitalization. Son notes loop recorder is in place and has been for 1-2 years. Denies palpitations or chest pain. States that more history can be obtained from daughters, who are not present at this time.   ED evaluation today shows UA w/ evidence of ongoing GN UTI, otherwise basically unremarkable.   Review Of Systems: Per HPI. Otherwise 12 point review of systems was performed and was unremarkable.  Patient Active Problem List   Diagnosis Date Noted  . Tachypnea   . Tachycardia   . Complete heart block 07/14/2014  . Syncope 07/13/2014  . Faintness   .  Dementia   . Chronic kidney disease (CKD), stage III (moderate) 04/11/2014  . History of urinary anomaly 03/04/2014  . Bladder infection, chronic 02/28/2014  . Right Hip arthritis 10/11/2013   Past Medical History: Past  Medical History  Diagnosis Date  . Hypertension   . GERD (gastroesophageal reflux disease)   . H/O hiatal hernia   . Arthritis   . Anemia     iron  . Syncope 07/13/2014  . UTI (lower urinary tract infection)    Past Surgical History: Past Surgical History  Procedure Laterality Date  . Joint replacement Left 2008  . Eye surgery Bilateral     cataracts   . Total hip arthroplasty Right 10/11/2013    Procedure: TOTAL HIP ARTHROPLASTY;  Surgeon: Nestor LewandowskyFrank J Rowan, MD;  Location: [facility] OR;  Service: Orthopedics;  Laterality: Right;   Social History: History  Substance Use Topics  . Smoking status: Never Smoker   . Smokeless tobacco: Never Used  . Alcohol Use: No   Please also refer to relevant sections of EMR.  Family History: Family History  Problem Relation Age of Onset  . Stroke Mother 4867    died of stroke   Allergies and Medications: No Known Allergies No current facility-administered medications on file prior to encounter.   Current Outpatient Prescriptions on File Prior to Encounter  Medication Sig Dispense Refill  . acetaminophen (TYLENOL) 500 MG tablet Take 500 mg by mouth 4 (four) times daily.    . Alum Hydroxide-Mag Carbonate (GAVISCON PO) Take 5 mLs by mouth 3 (three) times daily before meals.    Marland Kitchen. amLODipine (NORVASC) 10 MG tablet Take 1 tablet (10 mg total) by mouth daily. 30 tablet 0  . aspirin EC 81 MG tablet Take 81 mg by mouth daily.    . Cholecalciferol (VITAMIN D) 2000 UNITS CAPS Take 2,000 Units by mouth daily.    Marland Kitchen. CRANBERRY PO Take 240 mLs by mouth 3 (three) times daily. Cranberry concentrate solution    . gabapentin (NEURONTIN) 100 MG capsule Take 1 capsule by mouth 3 (three) times daily.  2  . omeprazole (PRILOSEC) 20 MG capsule Take 20 mg by mouth daily.    . Probiotic Product (ALIGN PO) Take 1 capsule by mouth daily.    . vitamin B-12 (CYANOCOBALAMIN) 1000 MCG tablet Take 1,000 mcg by mouth daily.    . cephALEXin (KEFLEX) 500 MG capsule Take 1 capsule  (500 mg total) by mouth 3 (three) times daily. (Patient not taking: Reported on 08/30/2014) 28 capsule 0  . ciprofloxacin (CIPRO) 250 MG tablet Take 1 tablet (250 mg total) by mouth 2 (two) times daily. (Patient not taking: Reported on 08/07/2014) 8 tablet 0  . predniSONE (DELTASONE) 20 MG tablet Take 1 tablet (20 mg total) by mouth daily with breakfast. (Patient not taking: Reported on 08/07/2014) 4 tablet 0    Objective: BP 133/82 mmHg  Pulse 119  Temp(Src) 98.3 F (36.8 C) (Oral)  Resp 20  SpO2 98% Exam: General: 80yo happily demented female resting comfortably in no apparent distress Cardiovascular: S1 and S2 noted. No murmurs,rubs, gallops. Tachycardic to 120s with alternating periods of NSR Respiratory: Clear to auscultation bilaterally. No wheezes, rales, rhonchi. No increased work of breathing. Abdomen: Bowel sounds noted. Soft and nondistended. Nontender to palpation. Extremities: No edema noted. Moves spontaneously. Skin: No rashes noted. Ecchymosis noted secondary to phlebotomy. Neuro: No focal deficits noted. Demented.  Labs and Imaging: CBC BMET   Recent Labs Lab 08/30/14 1221  WBC 6.9  HGB 12.9  HCT 39.4  PLT 348    Recent Labs Lab 08/30/14 1221  NA 137  K 4.4  CL 102  CO2 24  BUN 14  CREATININE 1.14*  GLUCOSE 87  CALCIUM 9.8      EKG - NSR, LAD, unchanged from previous   UCx 08/16/14 - VRE  IdamayRaleigh N Rumley, DO 08/30/2014, 1:46 PM PGY-1, [facility] FPTS Intern pager: [phone], text pages welcome  I have seen and evaluated the pt with Dr. Caroleen Hammanumley.  Please refer to her excellent H&P above for more information.  My additions are in red.  Twana FirstBryan R. Lakyra Tippins PGY 3 of [facility] 08/30/2014, 4:07 PM

## 2014-08-30 NOTE — ED Notes (Signed)
6E contacted to give report to RN. RN will call back

## 2014-08-30 NOTE — ED Notes (Signed)
Lab notified RN that labs sent down on this pt have clotted. Reordered labs and noted phlebotomy.

## 2014-08-31 DIAGNOSIS — E860 Dehydration: Secondary | ICD-10-CM | POA: Insufficient documentation

## 2014-08-31 LAB — BASIC METABOLIC PANEL
Anion gap: 8 (ref 5–15)
BUN: 11 mg/dL (ref 6–23)
CO2: 24 mmol/L (ref 19–32)
Calcium: 8.8 mg/dL (ref 8.4–10.5)
Chloride: 105 mEq/L (ref 96–112)
Creatinine, Ser: 1.02 mg/dL (ref 0.50–1.10)
GFR calc Af Amer: 57 mL/min — ABNORMAL LOW (ref >90)
GFR calc non Af Amer: 49 mL/min — ABNORMAL LOW (ref >90)
GLUCOSE: 84 mg/dL (ref 70–99)
Potassium: 3.6 mmol/L (ref 3.5–5.1)
Sodium: 137 mmol/L (ref 135–145)

## 2014-08-31 LAB — CBC
HEMATOCRIT: 31.7 % — AB (ref 36.0–46.0)
Hemoglobin: 10.5 g/dL — ABNORMAL LOW (ref 12.0–15.0)
MCH: 31.4 pg (ref 26.0–34.0)
MCHC: 33.1 g/dL (ref 30.0–36.0)
MCV: 94.9 fL (ref 78.0–100.0)
Platelets: 314 10*3/uL (ref 150–400)
RBC: 3.34 MIL/uL — ABNORMAL LOW (ref 3.87–5.11)
RDW: 13.6 % (ref 11.5–15.5)
WBC: 4.7 10*3/uL (ref 4.0–10.5)

## 2014-08-31 MED ORDER — SENNA 8.6 MG PO TABS: 1.0000 | ORAL_TABLET | Freq: Two times a day (BID) | ORAL | Status: DC

## 2014-08-31 MED ORDER — POLYETHYLENE GLYCOL 3350 17 G PO PACK: 17.0000 g | PACK | Freq: Every day | ORAL | Status: DC | PRN

## 2014-08-31 NOTE — Progress Notes (Signed)
[facility]  Patient Status: Active (receiving services up to time of hospitalization)  [facility] is providing the following services: RN, PT, OT and MSW  If patient discharges after hours, please call [phone].   Samul DadaMiranda Chris 08/31/2014, 4:47 PM

## 2014-08-31 NOTE — Plan of Care (Signed)
Problem: Phase I Progression Outcomes Goal: OOB as tolerated unless otherwise ordered Outcome: Completed/Met Date Met:  08/31/14 Pt up in chair

## 2014-08-31 NOTE — Care Management Note (Signed)
CARE MANAGEMENT NOTE 08/31/2014  Patient:  Victoria Lewis, Victoria Lewis   Account Number:  [id number]  Date Initiated:  08/31/2014  Documentation initiated by:  Daleena Rotter  Subjective/Objective Assessment:   CM following for progession and d/c planning.     Action/Plan:   08/31/2014 Met with pt and daughter, both request resumption of HHPT with current therapist, Antony Haste. Order entered and [facility] notified. Not other HH needs and pt has DME.   Anticipated DC Date:  09/01/2014   Anticipated DC Plan:  [place]         [facility] Choice  HOME HEALTH   Choice offered to / List presented to:  C-1 Patient        [place] arranged  [facility]      [place].   Status of service:  Completed, signed off Medicare Important Message given?  NA - LOS <3 / Initial given by admissions (If response is "NO", the following Medicare IM given date fields will be blank) Date Medicare IM given:   Medicare IM given by:   Date Additional Medicare IM given:   Additional Medicare IM given by:    Discharge Disposition:  [place]  Per UR Regulation:    If discussed at Long Length of Stay Meetings, dates discussed:    Comments:

## 2014-08-31 NOTE — Evaluation (Signed)
Occupational Therapy Evaluation Patient Details Name: Tera HelperBetty C Cammack MRN: [id number] DOB: [birth year] Today's Date: 08/31/2014    History of Present Illness 80 Y/O F with PMX of recurrent UTI, Dementia, tachycardia-bradycardia syndrome, CKD, brought in by son for hx of foul smelling urine, she was recently discharged from the hospital for similar presentation and was treated for VRE UTI, per son she was sent home on Ciprofloxacin and Cefepime, but he does not feel medication worked even after completing treatment. He denies noticing his mom with fever, no change in appetite or activity. She has dementia at baseline otherwise doing well.   Clinical Impression   Pt is currently total assist +2 for transfer from bed to bedside chair with RW.  Family present and able to offer details on PLOF.  Before admission she needed min assist for transfers but mod to max assist for all selfcare.  They are wanting to get her back to transferring and walking as she was prior to this episode.  No current interest in pursuing OT services as they assisted her with all of her bathing and dressing and want her to focus more on PT and mobility.  No further OT recommended at this time based on family wishes.  Feel she has all needed OT DME.  Depending on progress may need lift equipment but will let PT decide as they work with her.      Follow Up Recommendations  No OT follow up    Equipment Recommendations  None recommended by OT       Precautions / Restrictions Precautions Precautions: None Restrictions Weight Bearing Restrictions: No      Mobility Bed Mobility Overal bed mobility: Needs Assistance;+2 for physical assistance Bed Mobility: Supine to Sit     Supine to sit: +2 for physical assistance;Total assist     General bed mobility comments: Pt required assist with all aspects of supine to sit EOB.  Transfers Overall transfer level: Needs assistance Equipment used: Rolling walker (2  wheeled) Transfers: Sit to/from Stand Sit to Stand: +2 physical assistance;Total assist         General transfer comment: Pt with posterior lean in sitting needed max demonstrational cueing for hand placement and to initiate sit to stand.  Posterior lean in standing.  Pt unable to move her LEs efficiently for transfer at this time and demonstrates slight fear of movement when therapist attempted to assist her.      Balance Overall balance assessment: Needs assistance   Sitting balance-Leahy Scale: Poor       Standing balance-Leahy Scale: Zero                              ADL Overall ADL's : At baseline                                       General ADL Comments: Pt currently needs mod to max assist for all selfcare tasks.  Family assists with this.  She was able to self feed at home with setup and performs transfers at a min assist level.  Currently she needs total assist +2 for standpivot transfers from bed to to bedside chair.  She is incontinent so family assist with all cleaning and hygiene.  They wish to continue with HHPT but do not want HHOT at this time.  Perception Perception Perception Tested?: No   Praxis Praxis Praxis tested?: Not tested    Pertinent Vitals/Pain Pain Assessment: No/denies pain     Hand Dominance Right   Extremity/Trunk Assessment Upper Extremity Assessment Upper Extremity Assessment: Generalized weakness (bialteral gross strength 4/5 overall)   Lower Extremity Assessment Lower Extremity Assessment: Defer to PT evaluation   Cervical / Trunk Assessment Cervical / Trunk Assessment: Kyphotic   Communication Communication Communication: HOH   Cognition Arousal/Alertness: Awake/alert Behavior During Therapy: Flat affect Overall Cognitive Status: History of cognitive impairments - at baseline                                Home Living Family/patient expects to be discharged to:: Private  residence Living Arrangements: Spouse/significant other Available Help at Discharge: Available 24 hours/day;Family Type of Home: House Home Access: Ramped entrance     Home Layout: One level     Bathroom Shower/Tub: Producer, television/film/videoWalk-in shower   Bathroom Toilet: Handicapped height Bathroom Accessibility: Yes   Home Equipment: Environmental consultantWalker - 2 wheels;Bedside commode;Shower seat;Grab bars - toilet;Grab bars - tub/shower;Transport chair;Toilet riser          Prior Functioning/Environment Level of Independence: Needs assistance  Gait / Transfers Assistance Needed: Family assisted with bed mobility and trasnfers @ min A. Pt ambulated with RW. mostly sat during the day. ADL's / Homemaking Assistance Needed: Pt needed max assist for all bathing and dressing. Communication / Swallowing Assistance Needed: HOH; wears hearing aids                                End of Session Equipment Utilized During Treatment: Rolling walker Nurse Communication: Mobility status  Activity Tolerance: Patient tolerated treatment well Patient left: in chair;with family/visitor present   Time: [id number] OT Time Calculation (min): 34 min Charges:  OT General Charges $OT Visit: 1 Procedure OT Evaluation $Initial OT Evaluation Tier I: 1 Procedure OT Treatments $Self Care/Home Management : 23-37 mins  Sanaiya Welliver OTR/L 08/31/2014, 10:44 AM

## 2014-08-31 NOTE — Progress Notes (Signed)
Family Medicine Teaching Service Daily Progress Note Intern Pager: [phone]  [name] Medical record number: [id number] Date of birth: [birth year] Age: 80 y.o. Gender: female  Primary Care Provider: Paulina FusiSCHULTZ,DOUGLAS E, MD Consultants: None Code Status: Full on admission but have not been able to clarify with son yet. Daughter-in-law to discuss with family later but thinks full code will stay.  Pt Overview and Major Events to Date:  Admitted 12/23 with presumed VRE UTI.  Assessment and Plan: Victoria Lewis is a 80 y.o. female presenting with UTI with urine culture positive for VRE from 08/16/14. PMH is significant for dementia, CKD stage III, Chronic bladder infection, HTN, GERD, and Anemia.  # UTI: Presented with AMS and diagnosed with UTI on 12/8 and given Keflex. One culture from 12/8 grew VRE while second culture grew E. Coli. Presents with foul odor to urine. No reports of dysuria, fever, or changes in mental status (dementia at baseline). - WBC 6.9. Tachycardic at 126. Respiration 25 x1 (was otherwise normal) - Urinalysis positive for nitrites, moderate leukocytes, few epithelial cells, and many bacteria. - Repeat Urine Cultures - Obtain Blood Cultures x2 (drawn 12/22 at 1400. Obtained prior to antibiotics. - Infectious Disease consulted. Recommends Linezolid 600mg  BID - Will monitor vitals and WBC - Pending Cx and repeat sensitivities, as long as not bacteremic, should be able to be switch to PO Zyvox for d/c for 14 day course of medication.Monitor 24-48 hours.  # CKD Stage III: Creatinine 1.14 12/22>>1.0. Baseline creatinine 0.9-1.2 - Currently at baseline. - BMET tomorrow if here.  # Hypertension: Home medications include Amlodipine 10mg  - Continue Amlodipine - Well controlled. BP 101-148 / 63-91 in ED - Goal <140/90  # GERD: Home Medications include Gaviscon, Omeprazole 20mg  - Pantoprazole 40mg   # H/O Complete Heart Block - Resolved during last  hospitalization with change in blood pressure medications - Loop recorder in place. Son states she has had it for 1-2 years - Appears to be in Tachy/Brady syndrome on telemetry at cursory glance in ED. EKG obtained slightly thereafter shows sinus tachycardia, pt ASx. Monitor closely for any changes>>HR resolved and normal today.  # H/O Constipation - Denies constipation at this time. Noted to become altered with constipation at last hospitalization. Will treat PRN to prevent AMS. - Miralax, Sennakot  FEN/GI: Regular Diet. NS @ 125 x 12 hours now expired and encouraging PO. Prophylaxis: Continue Aspirin 81mg . Lovenox.  [facility]: [facility]   Subjective:  Doing well today with no concerns, pleasantly demented, daughter in law present with no concerns.  Objective: Temp:  [98.3 F (36.8 C)-99.3 F (37.4 C)] 98.9 F (37.2 C) (12/23 0952) Pulse Rate:  [84-126] 86 (12/23 0952) Resp:  [11-25] 16 (12/23 0952) BP: (101-148)/(50-91) 111/50 mmHg (12/23 0952) SpO2:  [95 %-99 %] 96 % (12/23 0952) Weight:  [138 lb 12.8 oz (62.959 kg)-139 lb 12.4 oz (63.4 kg)] 139 lb 12.4 oz (63.4 kg) (12/22 2206) Physical Exam: General: NAD, seated in chair Cardiovascular: RRR, no m/r/g, 2+ Bilateral radial pulses Respiratory: CTAB, normal effort Abdomen: S/NT including no suprapubic tenderness/ND Extremities: No LE edema or calf tenderness  Laboratory:  Recent Labs Lab 08/30/14 1221 08/31/14 0500  WBC 6.9 4.7  HGB 12.9 10.5*  HCT 39.4 31.7*  PLT 348 314    Recent Labs Lab 08/30/14 1221 08/31/14 0500  NA 137 137  K 4.4 3.6  CL 102 105  CO2 24 24  BUN 14 11  CREATININE 1.14* 1.02  CALCIUM 9.8 8.8  PROT 6.5  --   BILITOT 0.6  --   ALKPHOS 88  --   ALT 15  --   AST 26  --   GLUCOSE 87 84     Leona SingletonMaria T Asiyah Pineau, MD 08/31/2014, 10:02 AM PGY-3, [facility] FPTS Intern pager: [phone], text pages welcome

## 2014-08-31 NOTE — Discharge Summary (Signed)
[facility] Summary  [name] Medical record number: [id number] Date of birth: 08-15-1928 Age: 80 y.o. Gender: female Date of Admission: 08/30/2014  Date of Discharge: 09/01/2014  Admitting Physician: Janit PaganKehinde Eniola, MD  Primary Care Provider: Paulina FusiSCHULTZ,DOUGLAS E, MD Consultants: ID in ED  Indication for Hospitalization: foul odor of urine, concern for UTI  Discharge Diagnoses/Problem List:  UTI CKD stage III Hypertension GERD H/o complete heart block H/o constipation  Disposition: [facility]  Discharge Condition: Stable  Discharge Exam:  BP 135/60 mmHg  Pulse 83  Temp(Src) 98.2 F (36.8 C) (Oral)  Resp 18  Wt 148 lb 8 oz (67.359 kg)  SpO2 98% GEN: NAD, seated in exam room CV: RRR, no m/r/g, 2+ B radial pulses PULM: CTAB, normal effort ABD: S/NT/ND, no suprapubic tenderness EXTR: No LE edema or calf tenderness NEURO: Awake, alert, pleasantly demented, smiling and nodding to questions and also noted to have wet self in bed  Brief Hospital Course:   Victoria Lewis is a [age] y.o. female presenting with UTI with urine culture positive for VRE and E coli from 08/16/14. PMH is significant for dementia, CKD stage III, chronic bladder infection, HTN, GERD, and anemia.   UTI Presented 12/8 with AMS and diagnosed with UTI, continued keflex which had seemed to be helping. One culture at that time grew VRE while second grew E Coli (sn to cefazolin). Presented to ED 12/22 with family concern due to foul-smelling urine. No dysuria, fever, or changes in mental status reported (dementia at baseline). WBC 6.9, tachycardic, otherwise vitals were normal. UA with nitrites, mod leukocytes, few epithelial cells, and many bacteria. Repeated urine cultures and ordered blood cultures x 2 (12/22 at 1400 prior to abx). ID consulted in ED and IV linezolid started with plans to monitor cultures 24-48 hours  and d/c on PO zyvox 14 day course if negative. Blood cultures remained negative while urine culture was positive for GNR, possibly chronically colonized. Instructed family to have patient follow up early next week with PCP. Discussed with son, daughter, and daughter-in-law. Also educated re: reasons to come to ED for urinary symptoms.  H/o constipation Denies constipation this hospitalization but was altered last hospitalization with constipation so continued regimen from that time (miralax and senna).   Issues for Follow Up:  - No symptoms of UTI - Completion of 14 days zyvox - F/u if any symptoms of diarrhea from abx - F/u urine culture speciation; f/u blood culture x 2  Significant Procedures: None  Significant Labs and Imaging:   Recent Labs Lab 08/30/14 1221 08/31/14 0500 09/01/14 0500  WBC 6.9 4.7 4.8  HGB 12.9 10.5* 11.1*  HCT 39.4 31.7* 33.4*  PLT 348 314 300    Recent Labs Lab 08/30/14 1221 08/31/14 0500 09/01/14 0500  NA 137 137 137  K 4.4 3.6 3.4*  CL 102 105 106  CO2 24 24 25   GLUCOSE 87 84 79  BUN 14 11 10   CREATININE 1.14* 1.02 1.06  CALCIUM 9.8 8.8 8.9  ALKPHOS 88  --   --   AST 26  --   --   ALT 15  --   --   ALBUMIN 3.8  --   --    Urine culture: >100,000 GNRs Blood culture: NGTD x 2 tubes  Results/Tests Pending at Time of Discharge: urine culture speciation, blood culture final results x 2 tubes  Discharge Medications:    Medication  List    TAKE these medications        acetaminophen 500 MG tablet  Commonly known as:  TYLENOL  Take 500 mg by mouth 4 (four) times daily.     ALIGN PO  Take 1 capsule by mouth daily.     amLODipine 10 MG tablet  Commonly known as:  NORVASC  Take 1 tablet (10 mg total) by mouth daily.     aspirin EC 81 MG tablet  Take 81 mg by mouth daily.     CRANBERRY PO  Take 240 mLs by mouth 3 (three) times daily. Cranberry concentrate solution     gabapentin 100 MG capsule  Commonly known as:  NEURONTIN  Take  1 capsule by mouth 3 (three) times daily.     GAVISCON PO  Take 5 mLs by mouth 3 (three) times daily before meals.     linezolid 600 MG tablet  Commonly known as:  ZYVOX  Take 1 tablet (600 mg total) by mouth 2 (two) times daily. For 12 more days. Start with 12/24 PM dose     omeprazole 20 MG capsule  Commonly known as:  PRILOSEC  Take 20 mg by mouth daily.     polyethylene glycol packet  Commonly known as:  MIRALAX / GLYCOLAX  Take 17 g by mouth daily as needed for mild constipation.     senna 8.6 MG Tabs tablet  Commonly known as:  SENOKOT  Take 1 tablet (8.6 mg total) by mouth 2 (two) times daily.     vitamin B-12 1000 MCG tablet  Commonly known as:  CYANOCOBALAMIN  Take 1,000 mcg by mouth daily.     Vitamin D 2000 UNITS Caps  Take 2,000 Units by mouth daily.        Discharge Instructions: Please refer to Patient Instructions section of EMR for full details.  Patient was counseled important signs and symptoms that should prompt return to medical care, changes in medications, dietary instructions, activity restrictions, and follow up appointments.   Follow-Up Appointments: Follow-up Information    Follow up with SCHULTZ,DOUGLAS E, MD. Schedule an appointment as soon as possible for a visit in 1 week.   Specialty:  Internal Medicine   Why:  for hospital follow up   Contact information:   [address] [place] [address] [phone]       Leona SingletonMaria T Canden Cieslinski, MD 09/01/2014, 9:41 AM PGY-3, [facility]

## 2014-08-31 NOTE — Evaluation (Signed)
Physical Therapy Evaluation Patient Details Name: Victoria Lewis MRN: [id number] DOB: Aug 17, 1928 Today's Date: 08/31/2014   History of Present Illness  80 Y/O F with PMX of recurrent UTI, Dementia, tachycardia-bradycardia syndrome, CKD, brought in by son for hx of foul smelling urine, she was recently discharged from the hospital for similar presentation and was treated for VRE UTI, per son she was sent home on Ciprofloxacin and Cefepime, but he does not feel medication worked even after completing treatment. He denies noticing his mom with fever, no change in appetite or activity. She has dementia at baseline otherwise doing well.  Clinical Impression  Pt admitted with above diagnosis. Pt currently with functional limitations due to the deficits listed below (see PT Problem List). Pt not feeling well today and very weak, required stedy and +2 max A to transfer back to bed from recliner.  Pt will benefit from skilled PT to increase their independence and safety with mobility to allow discharge to the venue listed below.       Follow Up Recommendations Home health PT;Supervision/Assistance - 24 hour    Equipment Recommendations  None recommended by PT    Recommendations for Other Services       Precautions / Restrictions Precautions Precautions: Fall Restrictions Weight Bearing Restrictions: No      Mobility  Bed Mobility Overal bed mobility: Needs Assistance;+2 for physical assistance Bed Mobility: Sit to Supine       Sit to supine: +2 for physical assistance;Max assist   General bed mobility comments: pt assists by lowering trunk to bed but Max A needed to bring legs into bed and +2 to position comfortably in bed  Transfers Overall transfer level: Needs assistance Equipment used: Rolling walker (2 wheeled);Ambulation equipment used Transfers: Sit to/from UGI CorporationStand;Stand Pivot Transfers Sit to Stand: +2 physical assistance;Max assist Stand pivot transfers: +2 physical  assistance;Total assist       General transfer comment: Pt incontinent of urine upon PT arrival. Stood to RW with +2 max A and maintained x2 mins for clean up. Stood up 2nd time with front/ back assistance and pt able to do so with max translation of hips fwd but was able to step right foot about 3 in to pivot and unable to move left foot at all. Sat back down and used Stedy with Max A +2 to get in and out, to transfer bed to chairt  Ambulation/Gait             General Gait Details: unable today, reports that she does not feel well today and also seems very fearful in standing  Stairs            Wheelchair Mobility    Modified Rankin (Stroke Patients Only)       Balance Overall balance assessment: Needs assistance Sitting-balance support: No upper extremity supported;Feet supported Sitting balance-Leahy Scale: Fair Sitting balance - Comments: maintains balance sitting EOB with supervision Postural control: Posterior lean Standing balance support: Bilateral upper extremity supported;During functional activity Standing balance-Leahy Scale: Zero                               Pertinent Vitals/Pain Pain Assessment: No/denies pain    Home Living Family/patient expects to be discharged to:: Private residence Living Arrangements: Spouse/significant other Available Help at Discharge: Available 24 hours/day;Family Type of Home: House Home Access: Ramped entrance     Home Layout: One level Home Equipment: Environmental consultantWalker - 2 wheels;Bedside  commode;Shower seat;Grab bars - toilet;Grab bars - tub/shower;Transport chair;Toilet riser Additional Comments: pt has been working with HHPT and ambulated 40' the day before admission    Prior Function Level of Independence: Needs assistance   Gait / Transfers Assistance Needed: Family assisted with bed mobility and transfers @ min A. Pt ambulated with RW. mostly sat in chair during the day.  ADL's / Homemaking Assistance Needed:  Pt needed max assist for all bathing and dressing.        Hand Dominance   Dominant Hand: Right    Extremity/Trunk Assessment   Upper Extremity Assessment: Defer to OT evaluation           Lower Extremity Assessment: Generalized weakness      Cervical / Trunk Assessment: Kyphotic  Communication   Communication: HOH  Cognition Arousal/Alertness: Awake/alert Behavior During Therapy: Flat affect Overall Cognitive Status: History of cognitive impairments - at baseline                      General Comments General comments (skin integrity, edema, etc.): bilateral LE's very edematous    Exercises General Exercises - Lower Extremity Ankle Circles/Pumps: AROM;Both;20 reps;Supine Quad Sets: AROM;Both;10 reps;Supine Heel Slides: AAROM;Both;10 reps;Supine Hip ABduction/ADduction: AAROM;Both;10 reps;Supine      Assessment/Plan    PT Assessment Patient needs continued PT services  PT Diagnosis Generalized weakness;Difficulty walking   PT Problem List Decreased strength;Decreased activity tolerance;Decreased balance;Decreased mobility;Decreased range of motion;Decreased cognition;Decreased knowledge of use of DME;Decreased knowledge of precautions  PT Treatment Interventions DME instruction;Gait training;Functional mobility training;Therapeutic activities;Therapeutic exercise;Balance training;Patient/family education   PT Goals (Current goals can be found in the Care Plan section) Acute Rehab PT Goals Patient Stated Goal: return home PT Goal Formulation: With patient Time For Goal Achievement: 09/14/14 Potential to Achieve Goals: Fair    Frequency Min 3X/week   Barriers to discharge        Co-evaluation               End of Session Equipment Utilized During Treatment: Gait belt;Other (comment) (stedy) Activity Tolerance: Patient limited by fatigue Patient left: in bed;with call bell/phone within reach;with bed alarm set;with family/visitor  present Nurse Communication: Mobility status         Time: [id number] PT Time Calculation (min) (ACUTE ONLY): 38 min   Charges:   PT Evaluation $Initial PT Evaluation Tier I: 1 Procedure PT Treatments $Therapeutic Activity: 23-37 mins   PT G Codes:        Lyanne CoVictoria Rheta Lewis, PT  Acute Rehab Services  [phone]   Lyanne CoManess, Victoria Pianka 08/31/2014, 3:44 PM

## 2014-08-31 NOTE — Progress Notes (Addendum)
Interval progress note  Spoke with Victoria Lewis, patient's daughter, to update her on the plan. She appreciates the update and would like to tsay until tomorrow to monitor blood and urine cultures. She also asked for HHPT as this has been very helpful for Victoria Lewis's strength. I will place this order now with CM order to set up ([facility], therapist Hessie DienerAlan).  She also verified that code status for now is full code but she will continue to discuss with family.  Victoria SingletonMaria T Nelda Luckey, MD

## 2014-08-31 NOTE — Progress Notes (Signed)
UR Completed.  [phone]  

## 2014-09-01 ENCOUNTER — Telehealth: Payer: Self-pay | Admitting: Family Medicine

## 2014-09-01 LAB — CBC
HCT: 33.4 % — ABNORMAL LOW (ref 36.0–46.0)
Hemoglobin: 11.1 g/dL — ABNORMAL LOW (ref 12.0–15.0)
MCH: 30.9 pg (ref 26.0–34.0)
MCHC: 33.2 g/dL (ref 30.0–36.0)
MCV: 93 fL (ref 78.0–100.0)
Platelets: 300 10*3/uL (ref 150–400)
RBC: 3.59 MIL/uL — AB (ref 3.87–5.11)
RDW: 13.3 % (ref 11.5–15.5)
WBC: 4.8 10*3/uL (ref 4.0–10.5)

## 2014-09-01 LAB — URINE CULTURE: Colony Count: >=100000

## 2014-09-01 LAB — BASIC METABOLIC PANEL
ANION GAP: 6 (ref 5–15)
BUN: 10 mg/dL (ref 6–23)
CHLORIDE: 106 meq/L (ref 96–112)
CO2: 25 mmol/L (ref 19–32)
Calcium: 8.9 mg/dL (ref 8.4–10.5)
Creatinine, Ser: 1.06 mg/dL (ref 0.50–1.10)
GFR calc non Af Amer: 47 mL/min — ABNORMAL LOW (ref >90)
GFR, EST AFRICAN AMERICAN: 54 mL/min — AB (ref >90)
Glucose, Bld: 79 mg/dL (ref 70–99)
POTASSIUM: 3.4 mmol/L — AB (ref 3.5–5.1)
SODIUM: 137 mmol/L (ref 135–145)

## 2014-09-01 MED ORDER — LINEZOLID 600 MG PO TABS: 600.0000 mg | ORAL_TABLET | Freq: Two times a day (BID) | ORAL | Status: DC

## 2014-09-01 MED ORDER — POTASSIUM CHLORIDE CRYS ER 20 MEQ PO TBCR
20.0000 meq | EXTENDED_RELEASE_TABLET | Freq: Once | ORAL | Status: AC
  Administered 2014-09-01: 20 meq via ORAL
  Filled 2014-09-01: qty 1

## 2014-09-01 MED ORDER — ACETAMINOPHEN 500 MG PO TABS: 500.0000 mg | ORAL_TABLET | Freq: Four times a day (QID) | ORAL | Status: DC | PRN

## 2014-09-01 MED ORDER — CIPROFLOXACIN HCL 250 MG PO TABS: 250.0000 mg | ORAL_TABLET | Freq: Two times a day (BID) | ORAL | Status: DC

## 2014-09-01 NOTE — Telephone Encounter (Signed)
Received call from patients daughter regarding cost of zyvox medication. The pharmacy told her that if she wanted the medication today she would need to pay $6000. She states she does not have the money to pay for this medication if it costs that much. She states she was told that there is an insurance prior authorization that needs to be completed. I spoke to my attending, Dr Gwendolyn GrantWalden, about this and we felt as though it would be ok for the patient to wait 2-3 days on this PA for the medication as this is treating a culture from 2 weeks ago and her most recent UCx did not grow VRE. Once the medication is approved she should start taking the zyvox. She is to start taking the cipro today for her citrobacter infection.   Victoria AlarEric Gerard Cantara, MD

## 2014-09-01 NOTE — Progress Notes (Signed)
Pt. Discharged home with family . Discharge instructions given, meds reviewed. Patient's family verbalized understanding of signs and symptoms of worsening condition and when to call the doctor. Follow-up appointments reviewed.   Ok AnisSanders,Beuna Bolding A, RN 3:18 PM 09/01/2014

## 2014-09-01 NOTE — Progress Notes (Signed)
Physical Therapy Treatment Patient Details Name: Victoria Lewis MRN: [id number] DOB: 15-Jan-1928 Today's Date: 09/01/2014    History of Present Illness 80 Y/O F with PMX of recurrent UTI, Dementia, tachycardia-bradycardia syndrome, CKD, brought in by son for hx of foul smelling urine, she was recently discharged from the hospital for similar presentation and was treated for VRE UTI, per son she was sent home on Ciprofloxacin and Cefepime, but he does not feel medication worked even after completing treatment. He denies noticing his mom with fever, no change in appetite or activity. She has dementia at baseline otherwise doing well.    PT Comments    Pt was seen for her last visit before going home to be cared for by family.  Her plan is going to be a challenge for the family but they declined to think about SNF.  Pt is physically looking like she has not been ambulatory for some time, and sounds from son like she is operating at [facility] level now for the most part.  Follow Up Recommendations  SNF     Equipment Recommendations  None recommended by PT    Recommendations for Other Services       Precautions / Restrictions Precautions Precautions: Fall Restrictions Weight Bearing Restrictions: No    Mobility  Bed Mobility Overal bed mobility: Needs Assistance;+2 for physical assistance Bed Mobility: Supine to Sit;Sit to Supine     Supine to sit: Max assist Sit to supine: +2 for physical assistance;Max assist   General bed mobility comments: Pt doesn't follow instructions well and tends to lean backward and then PT has to lift legs and pivot her, due to pt being somewhat fearful  Transfers Overall transfer level: Needs assistance Equipment used: 1 person hand held assist;2 person hand held assist;Rolling walker (2 wheeled) Transfers: Sit to/from Stand Sit to Stand: +2 physical assistance;Max assist;From elevated surface         General transfer comment: Stood at walker with  son, who is [place] home today.  Pt is very weak and tends to let her legs  slide out and cannot fully control standing with her ankles in PF posture as well.  Ambulation/Gait             General Gait Details: unable and has PF contractures at ankles, implying she has not walked in a while   Stairs            Wheelchair Mobility    Modified Rankin (Stroke Patients Only)       Balance Overall balance assessment: Needs assistance Sitting-balance support: Bilateral upper extremity supported;Feet supported Sitting balance-Leahy Scale: Fair Sitting balance - Comments: Needed to maintain control initially then could control sitting.  Her tendency is to slide forward to edge of bed once she sits and needs max assist to scoot back on bed, pt cannot control this  Postural control: Posterior lean Standing balance support: Bilateral upper extremity supported Standing balance-Leahy Scale: Zero Standing balance comment: Pt cannot let her feet stay under her, lets the legs go                     Cognition Arousal/Alertness: Awake/alert Behavior During Therapy: Flat affect Overall Cognitive Status: History of cognitive impairments - at baseline       Memory: Decreased short-term memory              Exercises      General Comments General comments (skin integrity, edema, etc.): Pt is  a tremendous fall risk and looks like a candidate for inpt rehab due to her debility.  Family is committed to taking her home even though she is quite dependent.      Pertinent Vitals/Pain Pain Assessment: No/denies pain    Home Living                      Prior Function            PT Goals (current goals can now be found in the care plan section) Acute Rehab PT Goals Patient Stated Goal: Did not state Progress towards PT goals: Progressing toward goals    Frequency  Min 3X/week    PT Plan Discharge plan needs to be updated    Co-evaluation              End of Session Equipment Utilized During Treatment: Other (comment) (FWW) Activity Tolerance: Patient limited by lethargy Patient left: in bed;with call bell/phone within reach;with bed alarm set     Time: [id number] PT Time Calculation (min) (ACUTE ONLY): 28 min  Charges:  $Therapeutic Activity: 23-37 mins                    G CodesIvar Drape:      Sherrina Zaugg E 09/01/2014, 10:35 AM   Samul Dadauth Harrol Novello, PT MS Acute Rehab Dept. Number: [phone]

## 2014-09-01 NOTE — Discharge Instructions (Addendum)
Ms Victoria Lewis had a prior urinary tract infection that had a bacteria in it called vancomycin resistant enterococcus. This bacteria can be harmful so when she was seen again for this hospitalization, the Infectious Disease doctors consulted in the Emergency Room and decided to start a very strong antibiotic called linezolid which she was on starting 12/22, while we watched to make sure blood cultures did not grow bacteria. They remained negative 2 full days. Her urine culture grew out citrobacter that was sensitive to ciprofloxacin. She was discharged to complete a course of cipro. Her linezolid should be continued as well as she previously grew VRE in her urine. Both of these medications were sent to the pharmacy for the patient. She should follow up with her doctor as scheduled in about 1 week. Remember, her symptoms for this admission (dark smelly urine) were most likely from dehydration. Hydration remains important. Also, elderly patients frequently can have colonized urine that looks infected but does not cause problems. This is helpful to know, so that you do not feel you have to seek medical care each time she has dark urine. However, reasons to seek care include fevers, chills, pain with urination, or severe altered mental status. The antibiotic can cause diarrhea - if this occurs, inform your primary doctor.  She should start taking ciprofloxacin and zyvox by mouth today. These should be at the pharmacy for pick up.   Please call [phone] and ask for the family practice on call resident if there are any issues with her medications at the pharmacy.

## 2014-09-01 NOTE — Progress Notes (Signed)
Patient to be discharged today on zyvox and ciprofloxacin. Zyvox initially cancelled in discharge orders and pharmacy called to cancel this as well, though upon review of attending note for the day the zyvox is to be continued in addition to the cipro. I [facility] [place], and spoke with a pharmacy tech to inform them to disregard my message and advised them that I wanted them to fill the zyvox and cipro for this patient. The tech asked if this was correct and I stated that we wanted both zyvox and cipro filled. She voiced understanding of this. I re-sent the prescription for zyvox.   Marikay AlarEric Sonnenberg, MD

## 2014-09-05 ENCOUNTER — Ambulatory Visit (INDEPENDENT_AMBULATORY_CARE_PROVIDER_SITE_OTHER): Payer: Medicare Other | Admitting: Physician Assistant

## 2014-09-05 ENCOUNTER — Ambulatory Visit (INDEPENDENT_AMBULATORY_CARE_PROVIDER_SITE_OTHER): Payer: Medicare Other | Admitting: *Deleted

## 2014-09-05 ENCOUNTER — Encounter: Payer: Self-pay | Admitting: Physician Assistant

## 2014-09-05 VITALS — BP 144/78 | HR 99 | Ht 65.0 in | Wt 148.0 lb

## 2014-09-05 DIAGNOSIS — I10 Essential (primary) hypertension: Secondary | ICD-10-CM

## 2014-09-05 DIAGNOSIS — R609 Edema, unspecified: Secondary | ICD-10-CM

## 2014-09-05 DIAGNOSIS — R55 Syncope and collapse: Secondary | ICD-10-CM

## 2014-09-05 DIAGNOSIS — I442 Atrioventricular block, complete: Secondary | ICD-10-CM

## 2014-09-05 LAB — CULTURE, BLOOD (ROUTINE X 2)
CULTURE: NO GROWTH
Culture: NO GROWTH

## 2014-09-05 LAB — MDC_IDC_ENUM_SESS_TYPE_INCLINIC

## 2014-09-05 MED ORDER — AMLODIPINE BESYLATE 10 MG PO TABS: 10.0000 mg | ORAL_TABLET | Freq: Every day | ORAL | Status: DC

## 2014-09-05 NOTE — Progress Notes (Signed)
Add on LINQ check in clinic.   See full report in [facility].  To schedule 3 month recall with EP to establish care.

## 2014-09-05 NOTE — Progress Notes (Signed)
Cardiology Office Note   Date:  09/05/2014   ID:  Victoria Lewis, DOB 30-Nov-1927, MRN [id number]  PCP:  Paulina FusiSCHULTZ,DOUGLAS E, MD  Cardiologist:  Will establish with Dr. Sherryl MangesSteven Klein     History of Present Illness: Victoria Lewis is a 80 y.o. female with a hx of HTN, GERD, anemia, CKD, stage 3, dementia.  She was previously followed by Dr. Gypsy Balsamobert [place]rasowski [place].  She had a loop recorder placed earlier this year.  She has a hx of syncope.  She has hx of frequent UTIs.  [facility] for recurrent UTI.  She apparently had a syncopal episode there.  [facility] was then [facility] with a syncopal episode that occurred on the commode.  ILR interrogation demonstrated 15 seconds of CHB with HR of 30.  Beta blocker was DC'd. CHB resolved and she was not felt to need a pacemaker.   Amlodipine was added for BP control.  She was admitted again 12/22-12/24 with recurrent UTI.    She is here with her husband and daughter.  Her husband is a patient of Dr. Verne Carrowhristopher McAlhany and she prefers to be seen in this office.  Since she was recently DC'd she has done well.  She denies further syncope.  She denies chest pain.  She denies syncope.  She denies orthopnea, PND.  She has chronic LE edema.  This is improved after her legs are elevated.     Recent Labs: 07/17/2014: TSH 2.900 08/30/2014: ALT 15 09/01/2014: BUN 10; Creatinine 1.06; Hemoglobin 11.1*; Potassium 3.4*; Sodium 137    Recent Radiology:     Wt Readings from Last 3 Encounters:  09/05/14 148 lb (67.132 kg)  08/31/14 148 lb 8 oz (67.359 kg)  07/16/14 135 lb (61.236 kg)     Past Medical History  Diagnosis Date  . Hypertension   . GERD (gastroesophageal reflux disease)   . H/O hiatal hernia   . Arthritis   . Anemia     iron  . Syncope 07/13/2014  . UTI (lower urinary tract infection)   . Chronic kidney disease (CKD), stage III (moderate)     [name]/notes 08/30/2014  . Bladder infection, chronic    [name]/notes 08/30/2014  . Dementia     /notes 08/30/2014    Current Outpatient Prescriptions  Medication Sig Dispense Refill  . acetaminophen (TYLENOL) 500 MG tablet Take 1 tablet (500 mg total) by mouth every 6 (six) hours as needed. 30 tablet 0  . Alum Hydroxide-Mag Carbonate (GAVISCON PO) Take 5 mLs by mouth 3 (three) times daily before meals.    Marland Kitchen. amLODipine (NORVASC) 10 MG tablet Take 1 tablet (10 mg total) by mouth daily. 30 tablet 0  . aspirin EC 81 MG tablet Take 81 mg by mouth daily.    . Cholecalciferol (VITAMIN D) 2000 UNITS CAPS Take 2,000 Units by mouth daily.    . ciprofloxacin (CIPRO) 250 MG tablet Take 1 tablet (250 mg total) by mouth 2 (two) times daily. 6 tablet 0  . CRANBERRY PO Take 240 mLs by mouth 3 (three) times daily. Cranberry concentrate solution    . gabapentin (NEURONTIN) 100 MG capsule Take 1 capsule by mouth 3 (three) times daily.  2  . omeprazole (PRILOSEC) 20 MG capsule Take 20 mg by mouth daily.    . polyethylene glycol (MIRALAX / GLYCOLAX) packet Take 17 g by mouth daily as needed for mild constipation. 14 each 0  . Probiotic Product (ALIGN  PO) Take 1 capsule by mouth daily.    Marland Kitchen. senna (SENOKOT) 8.6 MG TABS tablet Take 1 tablet (8.6 mg total) by mouth 2 (two) times daily. 120 each 0  . vitamin B-12 (CYANOCOBALAMIN) 1000 MCG tablet Take 1,000 mcg by mouth daily.    Marland Kitchen. linezolid (ZYVOX) 600 MG tablet Take 1 tablet (600 mg total) by mouth 2 (two) times daily. (Patient not taking: Reported on 09/05/2014) 24 tablet 0   No current facility-administered medications for this visit.     Allergies:   Review of patient's allergies indicates no known allergies.   Social History:  The patient  reports that she has never smoked. She has never used smokeless tobacco. She reports that she does not drink alcohol or use illicit drugs.   Family History:  The patient's family history includes Stroke (age of onset: 667) in her mother.    ROS:  Please see the history of present  illness.      All other systems reviewed and negative.    PHYSICAL EXAM: VS:  BP 144/78 mmHg  Pulse 99  Ht 5\' 5"  (1.651 m)  Wt 148 lb (67.132 kg)  BMI 24.63 kg/m2 Well nourished, well developed, in no acute distress HEENT: normal Neck:  No  JVD90 degrees Cardiac:  normal S1, S2;  RRR; no  murmur  Lungs:   clear to auscultation bilaterally, no wheezing, rhonchi or rales Abd: soft, nontender, no hepatomegaly Ext:  1+ bilateral LE edemaR>L Skin: warm and dry Neuro:  CNs 2-12 intact, no focal abnormalities noted  ILR Interrogation:  One high VR episode (SVT) 11/16 x 15 seconds.  Otherwise, no pauses or CHB.        ASSESSMENT AND PLAN:   1. Syncope:  Occurred in the setting of CHB while on beta blocker Rx.  Interrogation of ILR today without further episodes.  No further syncope.   2. Hypertension:  Controlled. 3. Recurrent UTI:  FU with ID and urology. 4. Edema:  This appears to be dependent.  The R leg is always bigger than the L since her hip surgery.  She denies any changes.  Her edema resolves when her legs are elevated.  With all her bladder issues and her decreased mobility, I would not suggest Lasix.   Disposition:   She needs to FU with Dr. Sherryl MangesSteven Klein for her ILR.  I will have her follow with Dr. Graciela HusbandsKlein instead of Dr. Tresa EndoKelly who saw her in the hospital.  FU with Dr. Sherryl MangesSteven Klein 3 mos.    Signed, Brynda RimScott Weaver, PA-C, MHS 09/05/2014 3:18 PM    [facility] [address], [place], [place]  [address] Phone: [phone]; Fax: [phone]

## 2014-09-05 NOTE — Patient Instructions (Signed)
YOU HAVE BEEN REFERRED AS A NEW PT TO SEE DR. KLEIN IN 3 MONTHS  [facility]  Your physician recommends that you continue on your current medications as directed. Please refer to the Current Medication list given to you today.

## 2014-09-06 ENCOUNTER — Encounter: Payer: Self-pay | Admitting: *Deleted

## 2014-09-06 NOTE — Progress Notes (Unsigned)
[facility] from [facility] for [facility]zolid 600 mg tablet. PA form placed in provider box for completion. [name], Himmat Enberg L, RN

## 2014-09-07 ENCOUNTER — Encounter: Payer: Self-pay | Admitting: Family Medicine

## 2014-09-07 NOTE — Progress Notes (Signed)
AM preceptor on 09/07/14, prior authorization to Linezolid brought to my attention by Victoria Framesamika Martin RN, reviewed previous documentation (including recent hospitalization and cultures). Below is a brief summary.  11/29 - seen in ED for suspected UTI and AMS, treated with Keflex, urine culture ultimately grew greater than 100,000 colonies Ecoli sensitive to Keflex  08/16/14 - seen in ED again for UTI/AMS, repeated urine culture (ultimately grew 60,000 colonies VRE), patient sent home from ED to continue Keflex  12/22 - admitted to [facility] for UTI/AMS, started on Linezolid and Cipro to cover VRE, repeated culture which grew Citrobactor sensitive to Cipro, No VRE on this culture, sent home on Cipro and Linezolid (presumably to cover last culture that grew VRE), however patient unable to afford Linezolid  Family spoke to Victoria Lewis who stated that prior auth needed to be completed, PA form sent to office but arrived 12/24 when office was closed  Victoria Framesamika Martin RN brought prior auth to my attention as Victoria Lewis out of office, discussed case with pharmacy team and was agree that patient would benefit from repeat culture prior to completing prior authorization for Linezolid  Spoke to Victoria Lewis (daughter of patient), she is agreeable with this plan, Victoria Lewis feeling well, no fevers, some smell to urine, [facility]  Spoke to [facility], gave order for clean catch UA and culture (ok for straight cath is needed)  PA form placed back in Dr. Purvis Lewis's box  Victoria ShamKyle Brentin Shin MD

## 2014-09-07 NOTE — Progress Notes (Unsigned)
Given to Dr. Randolm IdolFletke to review and complete.  [name], Rayaan Garguilo L, RN

## 2014-09-07 NOTE — Progress Notes (Unsigned)
I am out of the office for New Years week. Dr Adriana Simasook is covering my box. Could you please se that this prior Berkley Harveyauth gets to him. This lady had a VRE UTI that was likely colonization, though she needs to complete the course of antibiotics. Thanks.

## 2014-09-10 ENCOUNTER — Other Ambulatory Visit: Payer: Self-pay | Admitting: Cardiovascular Disease

## 2014-09-12 ENCOUNTER — Telehealth: Payer: Self-pay | Admitting: Internal Medicine

## 2014-09-12 NOTE — Telephone Encounter (Signed)
Daughter of pt---was told by Dr Randolm Idol he would call her this afternoon. Please advise

## 2014-09-12 NOTE — Telephone Encounter (Signed)
[facility]ed [facility] to get urine results, urine culture still pending, UA positive for LE and blood,negative for nitrites, microscopy 2+ epi, 4+ bacteria, many RBC's, many WBC's, suspect contaminated  Discussed with patient's daughter, patient doing well, no fevers, has good energy, eating well, we decided to hold on antibiotics at this time.

## 2014-10-03 ENCOUNTER — Ambulatory Visit (INDEPENDENT_AMBULATORY_CARE_PROVIDER_SITE_OTHER): Payer: Self-pay | Admitting: *Deleted

## 2014-10-03 DIAGNOSIS — R55 Syncope and collapse: Secondary | ICD-10-CM

## 2014-10-06 NOTE — Progress Notes (Signed)
Loop recorder 

## 2014-10-07 ENCOUNTER — Encounter: Payer: Self-pay | Admitting: Internal Medicine

## 2014-10-11 ENCOUNTER — Ambulatory Visit (INDEPENDENT_AMBULATORY_CARE_PROVIDER_SITE_OTHER): Payer: Medicare Other | Admitting: Family Medicine

## 2014-10-11 ENCOUNTER — Encounter: Payer: Self-pay | Admitting: Family Medicine

## 2014-10-11 VITALS — BP 129/79 | HR 99 | Temp 97.8°F | Ht 65.0 in

## 2014-10-11 DIAGNOSIS — M1610 Unilateral primary osteoarthritis, unspecified hip: Secondary | ICD-10-CM

## 2014-10-11 DIAGNOSIS — M1990 Unspecified osteoarthritis, unspecified site: Secondary | ICD-10-CM

## 2014-10-11 DIAGNOSIS — F0390 Unspecified dementia without behavioral disturbance: Secondary | ICD-10-CM

## 2014-10-11 DIAGNOSIS — N3020 Other chronic cystitis without hematuria: Secondary | ICD-10-CM

## 2014-10-11 NOTE — Patient Instructions (Signed)
It was nice to meet you today.   Please let me know if you would like to address the areas on her face.  Return in 1-2 months for follow up.

## 2014-10-11 NOTE — Assessment & Plan Note (Signed)
Patient has recurrent UTI's, like chronic colonizer. No current symptoms, no fevers, tolerating diet. -monitor clinically

## 2014-10-11 NOTE — Progress Notes (Signed)
   Subjective:    Patient ID: Victoria Lewis, female    DOB: 11-17-1927, 81 y.o.   MRN: [id number]  HPI 81 y/o female with PMH chronic UTI, dementia, and CKD 3 presents to establish care. She is accompanied by her husband Victoria Picket(Scott) and her daughter Victoria Sine(Nancy).   Reviewed PMH/PSH/Medications/Allergies/Social history with patient and family. Majority of history obtained from daughter as patient is severely demented.   She was recently admitted in 07/2014 for syncopal episode, loop recorder at that time noted CHB, her BB was discontinued and patient responded to this therapy, seen recently by cardiology for follow up  She also has chronic UTI, admitted in 08/2014, she is currently without urinary complaint.   Dementia - has been evaluated by neurology in the past, CT head in 2015 showed atrophy and microvascular changes, at this time family is not interested in Aricept/Namenda.    Review of Systems  Constitutional: Negative for fever, chills and fatigue.  Respiratory: Negative for shortness of breath.   Cardiovascular: Negative for chest pain.  Gastrointestinal: Negative for nausea, vomiting and diarrhea.       Objective:   Physical Exam Vitals: reviewed Gen: pleasant female, sitting in wheelchair HEENT: normocephalic, PERRL, EOMI, no scleral icterus Cardiac: RRR, S1 and S2 present, no murmurs, no heaves/thrills Resp: CTAB, normal effort Abd: soft, no tenderness Ext: TED hose in place, 2+ swelling in bilateral LE Skin - multiple SK/AK on face  Reviewed recent hospital records.     Assessment & Plan:  Please see problem specific assessment and plan.

## 2014-10-11 NOTE — Assessment & Plan Note (Signed)
Unclear etiology however suspect 2/2 vascular dementia given recent CT head results. Dependent on IADL's, Dependent on most ADL's (able to feed self) -encouraged continued use of asa -unclear benefit of statin given patients age and poor functional capacity (discussed with family and elected to hold at this time).

## 2014-10-17 ENCOUNTER — Emergency Department (HOSPITAL_COMMUNITY)
Admission: EM | Admit: 2014-10-17 | Discharge: 2014-10-17 | Disposition: A | Discharge status: Home / Self Care | Payer: Medicare Other | Attending: Emergency Medicine | Admitting: Emergency Medicine

## 2014-10-17 ENCOUNTER — Encounter (HOSPITAL_COMMUNITY): Payer: Self-pay | Admitting: Emergency Medicine

## 2014-10-17 DIAGNOSIS — N390 Urinary tract infection, site not specified: Secondary | ICD-10-CM | POA: Diagnosis not present

## 2014-10-17 DIAGNOSIS — M1990 Unspecified osteoarthritis, unspecified site: Secondary | ICD-10-CM | POA: Diagnosis not present

## 2014-10-17 DIAGNOSIS — N183 Chronic kidney disease, stage 3 (moderate): Secondary | ICD-10-CM | POA: Diagnosis not present

## 2014-10-17 DIAGNOSIS — Z79899 Other long term (current) drug therapy: Secondary | ICD-10-CM | POA: Diagnosis not present

## 2014-10-17 DIAGNOSIS — F0390 Unspecified dementia without behavioral disturbance: Secondary | ICD-10-CM | POA: Diagnosis not present

## 2014-10-17 DIAGNOSIS — Z7982 Long term (current) use of aspirin: Secondary | ICD-10-CM | POA: Diagnosis not present

## 2014-10-17 DIAGNOSIS — I129 Hypertensive chronic kidney disease with stage 1 through stage 4 chronic kidney disease, or unspecified chronic kidney disease: Secondary | ICD-10-CM | POA: Insufficient documentation

## 2014-10-17 DIAGNOSIS — D649 Anemia, unspecified: Secondary | ICD-10-CM | POA: Diagnosis not present

## 2014-10-17 DIAGNOSIS — K219 Gastro-esophageal reflux disease without esophagitis: Secondary | ICD-10-CM | POA: Insufficient documentation

## 2014-10-17 DIAGNOSIS — Z87448 Personal history of other diseases of urinary system: Secondary | ICD-10-CM | POA: Diagnosis not present

## 2014-10-17 DIAGNOSIS — R55 Syncope and collapse: Secondary | ICD-10-CM | POA: Diagnosis not present

## 2014-10-17 LAB — TROPONIN I: Troponin I: <0.03 ng/mL (ref <0.031)

## 2014-10-17 LAB — URINE MICROSCOPIC-ADD ON

## 2014-10-17 LAB — COMPREHENSIVE METABOLIC PANEL
ALT: 13 U/L (ref 0–35)
AST: 25 U/L (ref 0–37)
Albumin: 3.7 g/dL (ref 3.5–5.2)
Alkaline Phosphatase: 83 U/L (ref 39–117)
Anion gap: 9 (ref 5–15)
BUN: 13 mg/dL (ref 6–23)
CO2: 25 mmol/L (ref 19–32)
Calcium: 9.6 mg/dL (ref 8.4–10.5)
Chloride: 103 mmol/L (ref 96–112)
Creatinine, Ser: 1.11 mg/dL — ABNORMAL HIGH (ref 0.50–1.10)
GFR, EST AFRICAN AMERICAN: 51 mL/min — AB (ref >90)
GFR, EST NON AFRICAN AMERICAN: 44 mL/min — AB (ref >90)
GLUCOSE: 100 mg/dL — AB (ref 70–99)
Potassium: 4.6 mmol/L (ref 3.5–5.1)
SODIUM: 137 mmol/L (ref 135–145)
Total Bilirubin: 0.6 mg/dL (ref 0.3–1.2)
Total Protein: 6.8 g/dL (ref 6.0–8.3)

## 2014-10-17 LAB — URINALYSIS, ROUTINE W REFLEX MICROSCOPIC
Bilirubin Urine: NEGATIVE
GLUCOSE, UA: NEGATIVE mg/dL
KETONES UR: NEGATIVE mg/dL
NITRITE: POSITIVE — AB
PH: 6 (ref 5.0–8.0)
Protein, ur: 30 mg/dL — AB
SPECIFIC GRAVITY, URINE: 1.017 (ref 1.005–1.030)
UROBILINOGEN UA: 0.2 mg/dL (ref 0.0–1.0)

## 2014-10-17 LAB — CBC WITH DIFFERENTIAL/PLATELET
BASOS PCT: 1 % (ref 0–1)
Basophils Absolute: 0.1 10*3/uL (ref 0.0–0.1)
EOS ABS: 0.3 10*3/uL (ref 0.0–0.7)
Eosinophils Relative: 4 % (ref 0–5)
HCT: 37.6 % (ref 36.0–46.0)
HEMOGLOBIN: 12.4 g/dL (ref 12.0–15.0)
Lymphocytes Relative: 7 % — ABNORMAL LOW (ref 12–46)
Lymphs Abs: 0.5 10*3/uL — ABNORMAL LOW (ref 0.7–4.0)
MCH: 31 pg (ref 26.0–34.0)
MCHC: 33 g/dL (ref 30.0–36.0)
MCV: 94 fL (ref 78.0–100.0)
MONOS PCT: 6 % (ref 3–12)
Monocytes Absolute: 0.4 10*3/uL (ref 0.1–1.0)
NEUTROS PCT: 82 % — AB (ref 43–77)
Neutro Abs: 6.2 10*3/uL (ref 1.7–7.7)
PLATELETS: 352 10*3/uL (ref 150–400)
RBC: 4 MIL/uL (ref 3.87–5.11)
RDW: 13.9 % (ref 11.5–15.5)
WBC: 7.5 10*3/uL (ref 4.0–10.5)

## 2014-10-17 MED ORDER — CEFTRIAXONE SODIUM 1 G IJ SOLR
1.0000 g | INTRAMUSCULAR | Status: DC
  Administered 2014-10-17: 1 g via INTRAVENOUS
  Filled 2014-10-17: qty 10

## 2014-10-17 MED ORDER — SODIUM CHLORIDE 0.9 % IV SOLN
INTRAVENOUS | Status: DC
  Administered 2014-10-17: 14:00:00 via INTRAVENOUS

## 2014-10-17 MED ORDER — CEPHALEXIN 500 MG PO CAPS: 500.0000 mg | ORAL_CAPSULE | Freq: Four times a day (QID) | ORAL | Status: DC

## 2014-10-17 MED ORDER — SODIUM CHLORIDE 0.9 % IV BOLUS (SEPSIS)
500.0000 mL | Freq: Once | INTRAVENOUS | Status: AC
  Administered 2014-10-17: 500 mL via INTRAVENOUS

## 2014-10-17 NOTE — Discharge Instructions (Signed)

## 2014-10-17 NOTE — ED Provider Notes (Signed)
CSN: [id number]     Arrival date & time 10/17/14  1033 History   First MD Initiated Contact with Patient 10/17/14 1055     Chief Complaint  Patient presents with  . Loss of Consciousness     (Consider location/radiation/quality/duration/timing/severity/associated sxs/prior Treatment) HPI Comments: Patient here after having a witnessed syncopal event last was on the toilet. Syncopal event lasted for approximately 1 minute and was without postictal period. She just finished moving her bowels which were described as not being bloody. Has had similar syncopal events associated with UTIs. She does have a history of a chronic uti but is not taking antibiotics. Family does note foul-smelling urine. No reported history of chest pain or shortness of breath. No recent vomiting or diarrhea. No seizure activity described. She is at her baseline at this time. No treatment used prior to arrival.  Patient is a 81 y.o. female presenting with syncope. The history is provided by the patient.  Loss of Consciousness   Past Medical History  Diagnosis Date  . Hypertension   . GERD (gastroesophageal reflux disease)   . H/O hiatal hernia   . Arthritis   . Anemia     iron  . Syncope 07/13/2014  . UTI (lower urinary tract infection)   . Chronic kidney disease (CKD), stage III (moderate)     [name]/notes 08/30/2014  . Bladder infection, chronic     [name]/notes 08/30/2014  . Dementia     /notes 08/30/2014   Past Surgical History  Procedure Laterality Date  . Joint replacement Left 2008  . Eye surgery Bilateral     cataracts   . Total hip arthroplasty Right 10/11/2013    Procedure: TOTAL HIP ARTHROPLASTY;  Surgeon: Nestor LewandowskyFrank J Rowan, MD;  Location: [facility] OR;  Service: Orthopedics;  Laterality: Right;  . Loop recorder implant      [name]/notes 08/30/2014  . Tubal ligation      [name]/notes 01/22/2011  . Total hip arthroplasty  05/2006    [name]/notes 01/22/2011   Family History  Problem Relation Age of Onset  . Stroke Mother 1667    died  of stroke   History  Substance Use Topics  . Smoking status: Never Smoker   . Smokeless tobacco: Never Used  . Alcohol Use: No   OB History    No data available     Review of Systems  Cardiovascular: Positive for syncope.  All other systems reviewed and are negative.     Allergies  Review of patient's allergies indicates no known allergies.  Home Medications   Prior to Admission medications   Medication Sig Start Date End Date Taking? Authorizing Provider  acetaminophen (TYLENOL) 500 MG tablet Take 1 tablet (500 mg total) by mouth every 6 (six) hours as needed. 09/01/14   Glori LuisEric G Sonnenberg, MD  Alum Hydroxide-Mag Carbonate (GAVISCON PO) Take 5 mLs by mouth 3 (three) times daily before meals.    Historical Provider, MD  amLODipine (NORVASC) 10 MG tablet TAKE 1 TABLET (10 MG TOTAL) BY MOUTH DAILY. 09/12/14   Duke SalviaSteven C Klein, MD  aspirin EC 81 MG tablet Take 81 mg by mouth daily.    Historical Provider, MD  Cholecalciferol (VITAMIN D) 2000 UNITS CAPS Take 2,000 Units by mouth daily.    Historical Provider, MD  CRANBERRY PO Take 240 mLs by mouth 3 (three) times daily. Cranberry concentrate solution    Historical Provider, MD  gabapentin (NEURONTIN) 100 MG capsule Take 1 capsule by mouth 3 (three) times daily. 07/28/14  Historical Provider, MD  omeprazole (PRILOSEC) 20 MG capsule Take 20 mg by mouth daily.    Historical Provider, MD  polyethylene glycol (MIRALAX / GLYCOLAX) packet Take 17 g by mouth daily as needed for mild constipation. 08/31/14   Leona Singleton, MD  Probiotic Product (ALIGN PO) Take 1 capsule by mouth daily.    Historical Provider, MD  vitamin B-12 (CYANOCOBALAMIN) 1000 MCG tablet Take 1,000 mcg by mouth daily.    Historical Provider, MD   BP 135/67 mmHg  Pulse 96  Temp(Src) 97.9 F (36.6 C) (Oral)  Resp 19  Ht  (1.676 m)  Wt 150 lb (68.04 kg)  BMI 24.22 kg/m2  SpO2 96% Physical Exam  Constitutional: She is oriented to person, place, and time.  She appears well-developed and well-nourished.  Non-toxic appearance. No distress.  HENT:  Head: Normocephalic and atraumatic.  Eyes: Conjunctivae, EOM and lids are normal. Pupils are equal, round, and reactive to light.  Neck: Normal range of motion. Neck supple. No tracheal deviation present. No thyroid mass present.  Cardiovascular: Normal rate, regular rhythm and normal heart sounds.  Exam reveals no gallop.   No murmur heard. Pulmonary/Chest: Effort normal and breath sounds normal. No stridor. No respiratory distress. She has no decreased breath sounds. She has no wheezes. She has no rhonchi. She has no rales.  Abdominal: Soft. Normal appearance and bowel sounds are normal. She exhibits no distension. There is no tenderness. There is no rebound and no CVA tenderness.  Musculoskeletal: Normal range of motion. She exhibits no edema or tenderness.  Neurological: She is alert and oriented to person, place, and time. She has normal strength. No cranial nerve deficit or sensory deficit. GCS eye subscore is 4. GCS verbal subscore is 5. GCS motor subscore is 6.  Skin: Skin is warm and dry. No abrasion and no rash noted.  Psychiatric: She has a normal mood and affect. Her speech is normal and behavior is normal.  Nursing note and vitals reviewed.   ED Course  Procedures (including critical care time) Labs Review Labs Reviewed  URINE CULTURE  URINALYSIS, ROUTINE W REFLEX MICROSCOPIC  TROPONIN I  CBC WITH DIFFERENTIAL/PLATELET  COMPREHENSIVE METABOLIC PANEL    Imaging Review No results found.   EKG Interpretation   Date/Time:  Monday October 17 2014 10:45:47 EST Ventricular Rate:  94 PR Interval:  179 QRS Duration: 80 QT Interval:  352 QTC Calculation: 440 R Axis:   52 Text Interpretation:  Sinus rhythm Low voltage, precordial leads No  significant change since last tracing Confirmed by Jerrald Doverspike  MD, Staisha Winiarski  ([id number]) on 10/17/2014 10:57:10 AM      MDM   Final diagnoses:  None    Patient given IV fluids here and a dose of antibiotics for her UTI. Family states that this is similar to what she's had before in the past. I explained to the family that this could still be a cardiac etiology and they are comfortable taking her home. Patient has had recurrent UTIs in the past and her most recent culture report showed that the organism was sensitive to Rocephin. Patient given a dose of Rocephin here and she will be placed on Keflex and she has a follow-up appointment to see the ID specialist     Toy Baker, MD 10/17/14 [phone]

## 2014-10-17 NOTE — ED Notes (Signed)
Per EMS: hx of dementia and uti and syncopal episode, approx 0930, had witness syncopal episode in bathroom while she was accompanied by her husband. Upon EMS arrival, pt was alert but somewhat drowsy, VSS. Has increased in alertness to her norm gradually since she left her residence with EMS. Strong odor to urine, family suspects UTI.   afib on monitor.  No fall, pt was on toilet, never hit floor.  Patient was with patient and did not allow her to fall.  22 Left hand.

## 2014-10-18 ENCOUNTER — Encounter: Payer: Self-pay | Admitting: Internal Medicine

## 2014-10-18 LAB — URINE CULTURE: Colony Count: >=100000

## 2014-10-24 LAB — MDC_IDC_ENUM_SESS_TYPE_REMOTE: MDC IDC SESS DTM: 20160117050500

## 2014-10-25 ENCOUNTER — Encounter: Payer: Self-pay | Admitting: Family Medicine

## 2014-10-25 ENCOUNTER — Ambulatory Visit (INDEPENDENT_AMBULATORY_CARE_PROVIDER_SITE_OTHER): Payer: Medicare Other | Admitting: Family Medicine

## 2014-10-25 VITALS — BP 132/74 | HR 83 | Temp 98.5°F

## 2014-10-25 DIAGNOSIS — N390 Urinary tract infection, site not specified: Secondary | ICD-10-CM

## 2014-10-25 NOTE — Progress Notes (Signed)
   Subjective:    Patient ID: Victoria Lewis, female    DOB: 04-Mar-1928, 81 y.o.   MRN: [id number]  HPI 81 y/o female presents for ED follow up. Accompanied by her husband and son Victoria Picket(Scott).   ED visit on 10/17/13 for decreased level of conciousness, she was on the commode, husband noticed that she was not acting her normal self, no loc however more confused/less responsive, patient has baseline dementia. Workup in ED identified UTI, patient has history of recurrent UTI, treated with Rocephin X1 and one week course of Keflex, she has had improvement of symptoms, now tolerating diet, almost back to baseline mental status, patient denies pain however unable to obtain much history from patient.  Spoke to daughter Victoria Lewis over the phone who also states that patient has improved over the past week, no further episodes of unresponsiveness, tolerating diet  In regards to history of recurrent UTI, seen by Dr. Earlene Plateravis (Urology), has attempted many prophylactic medications including Cipro/Keflex/and Bactrim which provided some short term benefit however patient always subsequently had UTI  Social - lives with husband   Review of Systems  Constitutional: Negative for fever.  Respiratory: Negative for shortness of breath.   Gastrointestinal: Negative for nausea, vomiting, abdominal pain and diarrhea.       Objective:   Physical Exam Vitals: reviewed Gen: pleasant female, oriented to place and self, not oriented to time Cardiac: RRR, S1 and S2 present, no murmur, no heaves/thrills Resp: CTAB, normal effort Abd: soft, normal bowel sounds, no tenderness MSK: sitting in wheelchair  2/8 urine culture - multiple morphotypes, no sensitivities     Assessment & Plan:  Please see problem specific assessment and plan.

## 2014-10-25 NOTE — Patient Instructions (Signed)
It was nice to see you today.  It appears that [name] has responded well to the treatment for her UTI.  Dr. Randolm IdolFletke will call Dr. Earlene Plateravis with urology to discuss options for long term treatment/prevention of UTI.

## 2014-10-25 NOTE — Assessment & Plan Note (Signed)
Patient presents for ED follow up for UTI. She has responded well to therapy with Rocephin and Keflex. -patient has failed multiple prophylactic therapies (Cipro/Bactrim/Keflex) -at this time will hold off on prophylactic medication, will discuss patient with Dr. Earlene Plateravis who has been caring for [name], will make recommendations to family once I have spoken with Dr. Earlene Plateravis.

## 2014-10-26 ENCOUNTER — Encounter: Payer: Self-pay | Admitting: Family Medicine

## 2014-10-26 ENCOUNTER — Telehealth: Payer: Self-pay | Admitting: Family Medicine

## 2014-10-26 DIAGNOSIS — N390 Urinary tract infection, site not specified: Secondary | ICD-10-CM

## 2014-10-26 NOTE — Telephone Encounter (Signed)
Called patient's daughter Harriett Sineancy. Informed her that I spoke with Dr. Earlene Plateravis. I recommended topical estrogen therapy. Will schedule appointment to discuss in more detail and perform GYN exam.

## 2014-10-26 NOTE — Progress Notes (Signed)
Spoke with Dr. Gaynelle Arabianonald Davis with Urology in regards to Ms. Klutz's recurrent UTI. Renal US done in 02/2014 was negative except for some debris in the bladder. Cystoscopy in 03/2014 was also unremarkable. He has attempted multiple prophylactic antibiotics. He recommended repeat renal US and would be happy to see her. Also recommended ID consult.

## 2014-11-01 ENCOUNTER — Ambulatory Visit: Payer: Medicare Other | Admitting: Family Medicine

## 2014-11-01 ENCOUNTER — Ambulatory Visit (INDEPENDENT_AMBULATORY_CARE_PROVIDER_SITE_OTHER): Payer: Medicare Other | Admitting: *Deleted

## 2014-11-01 ENCOUNTER — Ambulatory Visit (INDEPENDENT_AMBULATORY_CARE_PROVIDER_SITE_OTHER): Payer: Medicare Other | Admitting: Family Medicine

## 2014-11-01 ENCOUNTER — Encounter: Payer: Self-pay | Admitting: Family Medicine

## 2014-11-01 VITALS — BP 147/82 | HR 97 | Temp 97.6°F

## 2014-11-01 DIAGNOSIS — M545 Low back pain, unspecified: Secondary | ICD-10-CM | POA: Insufficient documentation

## 2014-11-01 DIAGNOSIS — N390 Urinary tract infection, site not specified: Secondary | ICD-10-CM

## 2014-11-01 DIAGNOSIS — M79605 Pain in left leg: Secondary | ICD-10-CM

## 2014-11-01 DIAGNOSIS — R55 Syncope and collapse: Secondary | ICD-10-CM

## 2014-11-01 LAB — CBC WITH DIFFERENTIAL/PLATELET
BASOS ABS: 0.1 10*3/uL (ref 0.0–0.1)
BASOS PCT: 1 % (ref 0–1)
EOS ABS: 0.4 10*3/uL (ref 0.0–0.7)
Eosinophils Relative: 5 % (ref 0–5)
HEMATOCRIT: 37.8 % (ref 36.0–46.0)
HEMOGLOBIN: 12.5 g/dL (ref 12.0–15.0)
Lymphocytes Relative: 11 % — ABNORMAL LOW (ref 12–46)
Lymphs Abs: 0.9 10*3/uL (ref 0.7–4.0)
MCH: 30.5 pg (ref 26.0–34.0)
MCHC: 33.1 g/dL (ref 30.0–36.0)
MCV: 92.2 fL (ref 78.0–100.0)
MPV: 9 fL (ref 8.6–12.4)
Monocytes Absolute: 0.7 10*3/uL (ref 0.1–1.0)
Monocytes Relative: 9 % (ref 3–12)
NEUTROS PCT: 74 % (ref 43–77)
Neutro Abs: 5.8 10*3/uL (ref 1.7–7.7)
Platelets: 447 10*3/uL — ABNORMAL HIGH (ref 150–400)
RBC: 4.1 MIL/uL (ref 3.87–5.11)
RDW: 13.6 % (ref 11.5–15.5)
WBC: 7.8 10*3/uL (ref 4.0–10.5)

## 2014-11-01 LAB — POCT UA - MICROSCOPIC ONLY

## 2014-11-01 LAB — POCT URINALYSIS DIPSTICK
BILIRUBIN UA: NEGATIVE
Glucose, UA: NEGATIVE
KETONES UA: NEGATIVE
Nitrite, UA: POSITIVE
PH UA: 6
PROTEIN UA: NEGATIVE
SPEC GRAV UA: 1.01
Urobilinogen, UA: 0.2

## 2014-11-01 MED ORDER — ESTROGENS, CONJUGATED 0.625 MG/GM VA CREA: 1.0000 | TOPICAL_CREAM | Freq: Every day | VAGINAL | Status: DC

## 2014-11-01 MED ORDER — CIPROFLOXACIN HCL 500 MG PO TABS: 500.0000 mg | ORAL_TABLET | Freq: Two times a day (BID) | ORAL | Status: DC

## 2014-11-01 NOTE — Progress Notes (Signed)
   Subjective:    Patient ID: Victoria Lewis, female    DOB: 13-Sep-1927, 81 y.o.   MRN: [id number]  HPI 81 year old female with past medical history of recurrent UTI presents for evaluation. She is accompanied by her husband Lorin PicketScott and her daughter Harriett Sineancy.  The initial purpose of this visit was to discuss further prophylactic treatment for recurrent UTI. Patient has attempted multiple oral antibiotics in the past which provided minimal improvement of her symptoms, she is currently seen by a urologist by the name of Dr. Earlene Plateravis who has completed renal ultrasound and cystoscopy within the past year which were unremarkable, the patient and her family are interested in treatment with topical estrogen, she was also been taking daily probiotic and daily cranberry  Patient has been not feeling well for the past 48-72 hours, she is been more fatigued and has had a decreased appetite, no fevers or chills, given her dementia is difficult to obtain significant past medical history or additional history of present illness, per her daughter this is how the patient asked with her recurrent urinary tract infections, she also has been having associated low back pain with radiation down the left lower extremity, patient does have a history of chronic low back pain, has been taking Tylenol as needed which has provided minimal relief, no new bladder or bowel incontinence  Social-lives with her husband Lorin PicketScott, daughter Harriett Sineancy is in home daily  Review of Systems  Constitutional: Positive for fatigue. Negative for fever and chills.  Respiratory: Negative for shortness of breath.   Cardiovascular: Negative for chest pain.  Gastrointestinal: Negative for nausea, vomiting, abdominal pain and diarrhea.       Objective:   Physical Exam Vitals: Reviewed Gen.: Pleasant female, no acute distress, sitting in wheelchair HEENT: Normocephalic, pupils equal bilaterally, dry mucous membranes Cardiac: Regular rate and rhythm, S1  and S2 present, no murmurs, no heaves or thrills Respiratory: Clear to patient bilaterally, normal effort Abdomen: Soft, nontender, bowel sounds present MSK: Mild left paraspinal tenderness, no CVA tenderness, modified slump test identified possible pain in the left lower extremity however is difficult to perform a good exam given patient's dementia GU: No vulvar lesions noted, there is a mole on her left inner thigh that has been present for years according to the patient's daughter, straight cath performed to obtain urine  Reviewed urine dipstick and microscopic analysis     Assessment & Plan:  Please see problem specific assessment and plan.

## 2014-11-01 NOTE — Patient Instructions (Signed)
It was nice to see you today.  Start Premarin Cream (estrogen) once daily. Apply one applicator daily.  You have a urinary tract infection. Dr. Randolm IdolFletke will call you with the urine culture results and blood work. If symptoms worsen please return to office or go to the ER.   For the back pain continue tylenol daily, add heat twice daily.

## 2014-11-01 NOTE — Assessment & Plan Note (Signed)
81 year old female presents for follow-up of recurrent UTI, she has current symptoms that are consistent with infection. Urine dipstick and microscopic examination identified UTI from urine obtained using catheter. -Will acutely treat UTI with ciprofloxacin, urine sent for culture, obtain CBC to evaluate for systemic infection -Low back pain more consistent with MSK etiology however if symptoms persist would consider imaging to rule out pyelonephritis -Initiated therapy with topical estrogen to help prevent recurrent UTI -Return precautions discussed

## 2014-11-01 NOTE — Assessment & Plan Note (Signed)
Patient presents with acute flare of low back pain with left-sided sciatica. Symptoms clinically most consistent with MSK etiology however given current urinary checked infection would have low threshold for imaging to rule out pyelonephritis. -Given stable clinical appearance will initiate antibiotic treatment and check CBC to check for systemic disease -Encouraged continued use of Tylenol and warm compresses to help with low back pain

## 2014-11-02 ENCOUNTER — Telehealth: Payer: Self-pay | Admitting: *Deleted

## 2014-11-02 NOTE — Telephone Encounter (Signed)
Pt's daughter Harriett Sineancy called wanting to know if there was another form of estrogen that could be prescribed other than the cream.  She also wanted to know if she should clean the area with betadine before inserting the vaginal cream.  Please give her a call back at [phone].  She is at work until 3 PM today.  Will forward to PCP.  Please advise.  [name], Kiaraliz Rafuse L, RN

## 2014-11-03 NOTE — Telephone Encounter (Signed)
Returned call. Answered questions. Discussed recent lab work.

## 2014-11-04 ENCOUNTER — Telehealth: Payer: Self-pay | Admitting: Family Medicine

## 2014-11-04 LAB — URINE CULTURE

## 2014-11-04 MED ORDER — CEPHALEXIN 500 MG PO CAPS: 500.0000 mg | ORAL_CAPSULE | Freq: Two times a day (BID) | ORAL | Status: DC

## 2014-11-04 NOTE — Telephone Encounter (Signed)
Discussed urine culture results with daughter Harriett Sineancy. Ecoli UTI resistant to current therapy with Cipro. Will change to keflex 500 mg BID.

## 2014-11-04 NOTE — Progress Notes (Signed)
Loop recorder 

## 2014-11-16 LAB — MDC_IDC_ENUM_SESS_TYPE_REMOTE

## 2014-11-25 ENCOUNTER — Encounter: Payer: Self-pay | Admitting: Internal Medicine

## 2014-12-01 ENCOUNTER — Ambulatory Visit (INDEPENDENT_AMBULATORY_CARE_PROVIDER_SITE_OTHER): Payer: Medicare Other | Admitting: *Deleted

## 2014-12-01 DIAGNOSIS — R55 Syncope and collapse: Secondary | ICD-10-CM

## 2014-12-02 ENCOUNTER — Ambulatory Visit (INDEPENDENT_AMBULATORY_CARE_PROVIDER_SITE_OTHER): Payer: Medicare Other | Admitting: Internal Medicine

## 2014-12-02 ENCOUNTER — Encounter: Payer: Self-pay | Admitting: Internal Medicine

## 2014-12-02 VITALS — BP 144/80 | HR 102 | Ht 66.0 in | Wt 137.0 lb

## 2014-12-02 DIAGNOSIS — Z4509 Encounter for adjustment and management of other cardiac device: Secondary | ICD-10-CM | POA: Diagnosis not present

## 2014-12-02 DIAGNOSIS — I442 Atrioventricular block, complete: Secondary | ICD-10-CM | POA: Diagnosis not present

## 2014-12-02 LAB — MDC_IDC_ENUM_SESS_TYPE_INCLINIC
MDC IDC SESS DTM: 20160325163249
MDC IDC SET ZONE DETECTION INTERVAL: 2000 ms
MDC IDC SET ZONE DETECTION INTERVAL: 3000 ms
Zone Setting Detection Interval: 380 ms

## 2014-12-02 LAB — MDC_IDC_ENUM_SESS_TYPE_REMOTE
Date Time Interrogation Session: 20160411225912
MDC IDC SET ZONE DETECTION INTERVAL: 2000 ms
Zone Setting Detection Interval: 3000 ms
Zone Setting Detection Interval: 380 ms

## 2014-12-02 NOTE — Progress Notes (Signed)
ELECTROPHYSIOLOGY CONSULT NOTE  Patient ID: Victoria HelperBetty C Rinehimer, MRN: [id number], DOB/AGE: June 18, 1928 81 y.o. Admit date: (Not on file) Date of Consult: 12/02/2014  Primary Physician: Uvaldo RisingFLETKE, KYLE, J, MD Primary Cardiologist: new  Chief Complaint:  syncoep   HPI Victoria Lewis is a 81 y.o. female  Referred for evaluation of recurrent syncope.  These have been long-standing. They're characteristically associated with using the commode. She is described as being extremely pale and flushed. Because of recurrent events, she underwent loop recorder insertion.   [facility] for recurrent UTI.  Following discharge, she had an episode of syncope while on the commode at home. She was brought to the hospital and underwent device interrogation. Demonstrated 15 seconds of complete heart block. Metoprolol was  decreased.EP was not consulted  interrogation this is described below.  Metoprolol was discontinued amlodipine was started in its stead.  [facility] demonstrated a nuclear stress test arch 2014 demonstrating normal left ventricular function and no ischemia     . Beta blocker was DC'd. CHB resolved and she was not felt to need a pacemaker.   Past Medical History  Diagnosis Date  . Hypertension   . GERD (gastroesophageal reflux disease)   . H/O hiatal hernia   . Arthritis   . Anemia     iron  . Syncope 07/13/2014  . UTI (lower urinary tract infection)   . Chronic kidney disease (CKD), stage III (moderate)     [name]/notes 08/30/2014  . Bladder infection, chronic     [name]/notes 08/30/2014  . Dementia     [name]/notes 08/30/2014  . AV block, complete   . Chest pain   . Decreased dorsalis pedis pulse   . Hammer toe   . Hyperlipidemia   . Anemia   . Onychogryposis of toenail   . Osteoporosis   . Scoliosis   . Status post placement of implantable loop recorder   . Syncope   . Uterine disorder   . Diaphragmatic hernia   . Hx of  epistaxis   . Peptic ulcer       Surgical History:  Past Surgical History  Procedure Laterality Date  . Joint replacement Left 2008  . Eye surgery Bilateral     cataracts   . Total hip arthroplasty Right 10/11/2013    Procedure: TOTAL HIP ARTHROPLASTY;  Surgeon: Nestor LewandowskyFrank J Rowan, MD;  Location: [facility] OR;  Service: Orthopedics;  Laterality: Right;  . Loop recorder implant      [name]/notes 08/30/2014  . Tubal ligation      [name]/notes 01/22/2011  . Total hip arthroplasty  05/2006    [name]/notes 01/22/2011     Home Meds: Prior to Admission medications   Medication Sig Start Date End Date Taking? Authorizing Provider  acetaminophen (TYLENOL) 500 MG tablet Take 1 tablet (500 mg total) by mouth every 6 (six) hours as needed. 09/01/14  Yes Glori LuisEric G Sonnenberg, MD  aluminum hydroxide-magnesium carbonate (GAVISCON) 95-358 MG/15ML SUSP Take by mouth.   Yes Historical Provider, MD  amLODipine (NORVASC) 10 MG tablet TAKE 1 TABLET (10 MG TOTAL) BY MOUTH DAILY. 09/12/14  Yes Duke SalviaSteven C Klein, MD  aspirin EC 81 MG tablet Take 81 mg by mouth daily.   Yes Historical Provider, MD  cholecalciferol (VITAMIN D) 1000 UNITS tablet Take 1,000 Units by mouth daily.   Yes Historical Provider, MD  conjugated estrogens (PREMARIN) vaginal cream Place 1 Applicatorful vaginally daily. 11/01/14  Yes Uvaldo RisingKyle J Fletke,  MD  CRANBERRY PO Take 240 mLs by mouth 3 (three) times daily. Cranberry concentrate solution   Yes Historical Provider, MD  D-MANNOSE PO Take 1 tablet by mouth 2 (two) times daily.   Yes Historical Provider, MD  ergocalciferol (VITAMIN D2) 50000 UNITS capsule Take 50,000 Units by mouth once a week.   Yes Historical Provider, MD  gabapentin (NEURONTIN) 100 MG capsule Take 1 capsule by mouth 3 (three) times daily. 07/28/14  Yes Historical Provider, MD  omeprazole (PRILOSEC) 20 MG capsule Take 20 mg by mouth daily.   Yes Historical Provider, MD  Probiotic Product (ALIGN PO) Take 1 capsule by mouth daily.   Yes Historical Provider, MD    vitamin B-12 (CYANOCOBALAMIN) 1000 MCG tablet Take 1,000 mcg by mouth daily.   Yes Historical Provider, MD  Wheat Dextrin (BENEFIBER) POWD Take 1 Dose by mouth daily.   Yes Historical Provider, MD      Allergies: No Known Allergies  History   Social History  . Marital Status: Married    Spouse Name: N/A  . Number of Children: N/A  . Years of Education: N/A   Occupational History  . Not on file.   Social History Main Topics  . Smoking status: Never Smoker   . Smokeless tobacco: Never Used  . Alcohol Use: No  . Drug Use: No  . Sexual Activity: Not on file   Other Topics Concern  . Not on file   Social History Narrative   Lives with her husband Derma.      Family History  Problem Relation Age of Onset  . Stroke Mother 18    died of stroke  . Hypertension Mother   . Heart disease Mother      ROS:  Please see the history of present illness.     All other systems reviewed and negative.    Physical Exam:   Blood pressure 144/80, pulse 102, height  (1.676 m), weight 137 lb (62.143 kg), SpO2 96 %. General: Well developed, cachectic  female in no acute distress. Head: Normocephalic, atraumatic, sclera non-icteric, no xanthomas, nares are without discharge. EENT: normal Lymph Nodes:  none Back: with  Kyphosis  no CVA tendersness Neck: Negative for carotid bruits. J  Lungs: Clear bilaterally to auscultation without wheezes, rales, or rhonchi. Breathing is unlabored. Heart: RRR with S1 S2. 2 /6 systolic murmur , rubs, or gallops appreciated. Abdomen: Soft, non-tender, non-distended with normoactive bowel sounds. No hepatomegaly. No rebound/guarding. No obvious abdominal masses. Msk:  Strength and tone appear normal for age. Extremities: No clubbing or cyanosis. 2+ edema.  Distal pedal pulses are 2+ and equal bilaterally. Skin: Warm and Dry Neuro: Alert  . CN III-XII intact she sits in her wheel chair Psych:  Responds to questions appropriately with a normal  affect.      Labs: Cardiac Enzymes No results for input(s): CKTOTAL, CKMB, TROPONINI in the last 72 hours. CBC Lab Results  Component Value Date   WBC 7.8 11/01/2014   HGB 12.5 11/01/2014   HCT 37.8 11/01/2014   MCV 92.2 11/01/2014   PLT 447* 11/01/2014      Miscellaneous Lab Results  Component Value Date   DDIMER 1.66* 07/17/2014    Radiology/Studies:  No results found.  EKG: not obtained  Records from [facility] were reviewed  Interrogation of the loop recorder demonstrates simultaneous PP prolongation and CHB   Assessment and Plan:   Syncope- neurally mediated.    Dementia  We reviewed extensively the physiology  of these episodes. I don't think was related to the beta blocker. These are occurring on the commode. She is apparently not at significant risk for falling. I've advised the family that if this happens he should use later on the floor and that the episode will be self-limited. It will be important to be careful in terms of residual orthostatic intolerance to get her up gradually.  We don't know what her chart response would be is a we don't know, based on ISSUE 3, As to whether she would be a candidate for pacing as this was limited to people who had no vasomotor instability detected on tilt table testing. The family is not interested in pursuing pacing.  Sherryl Manges

## 2014-12-02 NOTE — Progress Notes (Signed)
Loop recorder 

## 2014-12-02 NOTE — Patient Instructions (Signed)
Your physician recommends that you continue on your current medications as directed. Please refer to the Current Medication list given to you today.  Your physician wants you to follow-up in: 1 year with Dr. Klein.  You will receive a reminder letter in the mail two months in advance. If you don't receive a letter, please call our office to schedule the follow-up appointment.  

## 2014-12-03 ENCOUNTER — Emergency Department (HOSPITAL_COMMUNITY)
Admission: EM | Admit: 2014-12-03 | Discharge: 2014-12-03 | Disposition: A | Discharge status: Home / Self Care | Payer: Medicare Other | Attending: Emergency Medicine | Admitting: Emergency Medicine

## 2014-12-03 ENCOUNTER — Encounter (HOSPITAL_COMMUNITY): Payer: Self-pay | Admitting: Emergency Medicine

## 2014-12-03 DIAGNOSIS — Z8709 Personal history of other diseases of the respiratory system: Secondary | ICD-10-CM | POA: Insufficient documentation

## 2014-12-03 DIAGNOSIS — I129 Hypertensive chronic kidney disease with stage 1 through stage 4 chronic kidney disease, or unspecified chronic kidney disease: Secondary | ICD-10-CM | POA: Insufficient documentation

## 2014-12-03 DIAGNOSIS — Z8639 Personal history of other endocrine, nutritional and metabolic disease: Secondary | ICD-10-CM | POA: Insufficient documentation

## 2014-12-03 DIAGNOSIS — K219 Gastro-esophageal reflux disease without esophagitis: Secondary | ICD-10-CM | POA: Insufficient documentation

## 2014-12-03 DIAGNOSIS — F0390 Unspecified dementia without behavioral disturbance: Secondary | ICD-10-CM | POA: Insufficient documentation

## 2014-12-03 DIAGNOSIS — Z79899 Other long term (current) drug therapy: Secondary | ICD-10-CM | POA: Insufficient documentation

## 2014-12-03 DIAGNOSIS — N183 Chronic kidney disease, stage 3 (moderate): Secondary | ICD-10-CM | POA: Insufficient documentation

## 2014-12-03 DIAGNOSIS — Z8742 Personal history of other diseases of the female genital tract: Secondary | ICD-10-CM | POA: Insufficient documentation

## 2014-12-03 DIAGNOSIS — N390 Urinary tract infection, site not specified: Secondary | ICD-10-CM | POA: Insufficient documentation

## 2014-12-03 DIAGNOSIS — Z87448 Personal history of other diseases of urinary system: Secondary | ICD-10-CM | POA: Insufficient documentation

## 2014-12-03 DIAGNOSIS — Z7982 Long term (current) use of aspirin: Secondary | ICD-10-CM | POA: Diagnosis not present

## 2014-12-03 DIAGNOSIS — R5383 Other fatigue: Secondary | ICD-10-CM | POA: Insufficient documentation

## 2014-12-03 DIAGNOSIS — R509 Fever, unspecified: Secondary | ICD-10-CM | POA: Diagnosis present

## 2014-12-03 DIAGNOSIS — Z872 Personal history of diseases of the skin and subcutaneous tissue: Secondary | ICD-10-CM | POA: Insufficient documentation

## 2014-12-03 DIAGNOSIS — D649 Anemia, unspecified: Secondary | ICD-10-CM | POA: Diagnosis not present

## 2014-12-03 DIAGNOSIS — Z9851 Tubal ligation status: Secondary | ICD-10-CM | POA: Insufficient documentation

## 2014-12-03 DIAGNOSIS — M1990 Unspecified osteoarthritis, unspecified site: Secondary | ICD-10-CM | POA: Insufficient documentation

## 2014-12-03 LAB — CBC WITH DIFFERENTIAL/PLATELET
BASOS ABS: 0.1 10*3/uL (ref 0.0–0.1)
BASOS PCT: 2 % — AB (ref 0–1)
Eosinophils Absolute: 0.5 10*3/uL (ref 0.0–0.7)
Eosinophils Relative: 8 % — ABNORMAL HIGH (ref 0–5)
HCT: 35.9 % — ABNORMAL LOW (ref 36.0–46.0)
Hemoglobin: 11.5 g/dL — ABNORMAL LOW (ref 12.0–15.0)
Lymphocytes Relative: 12 % (ref 12–46)
Lymphs Abs: 0.8 10*3/uL (ref 0.7–4.0)
MCH: 29 pg (ref 26.0–34.0)
MCHC: 32 g/dL (ref 30.0–36.0)
MCV: 90.4 fL (ref 78.0–100.0)
MONO ABS: 0.6 10*3/uL (ref 0.1–1.0)
Monocytes Relative: 9 % (ref 3–12)
NEUTROS PCT: 69 % (ref 43–77)
Neutro Abs: 4.5 10*3/uL (ref 1.7–7.7)
Platelets: 322 10*3/uL (ref 150–400)
RBC: 3.97 MIL/uL (ref 3.87–5.11)
RDW: 13.5 % (ref 11.5–15.5)
WBC: 6.4 10*3/uL (ref 4.0–10.5)

## 2014-12-03 LAB — URINALYSIS, ROUTINE W REFLEX MICROSCOPIC
Bilirubin Urine: NEGATIVE
Glucose, UA: NEGATIVE mg/dL
HGB URINE DIPSTICK: NEGATIVE
KETONES UR: NEGATIVE mg/dL
Leukocytes, UA: NEGATIVE
Nitrite: POSITIVE — AB
Protein, ur: NEGATIVE mg/dL
Specific Gravity, Urine: 1.007 (ref 1.005–1.030)
Urobilinogen, UA: 0.2 mg/dL (ref 0.0–1.0)
pH: 6.5 (ref 5.0–8.0)

## 2014-12-03 LAB — COMPREHENSIVE METABOLIC PANEL
ALBUMIN: 3.7 g/dL (ref 3.5–5.2)
ALK PHOS: 90 U/L (ref 39–117)
ALT: 13 U/L (ref 0–35)
ANION GAP: 10 (ref 5–15)
AST: 25 U/L (ref 0–37)
BUN: 12 mg/dL (ref 6–23)
CALCIUM: 9.4 mg/dL (ref 8.4–10.5)
CO2: 24 mmol/L (ref 19–32)
Chloride: 102 mmol/L (ref 96–112)
Creatinine, Ser: 1.05 mg/dL (ref 0.50–1.10)
GFR calc Af Amer: 55 mL/min — ABNORMAL LOW (ref >90)
GFR calc non Af Amer: 47 mL/min — ABNORMAL LOW (ref >90)
Glucose, Bld: 91 mg/dL (ref 70–99)
Potassium: 4.1 mmol/L (ref 3.5–5.1)
Sodium: 136 mmol/L (ref 135–145)
Total Bilirubin: 0.6 mg/dL (ref 0.3–1.2)
Total Protein: 6.5 g/dL (ref 6.0–8.3)

## 2014-12-03 LAB — URINE MICROSCOPIC-ADD ON

## 2014-12-03 MED ORDER — CEPHALEXIN 500 MG PO CAPS: 500.0000 mg | ORAL_CAPSULE | Freq: Four times a day (QID) | ORAL | Status: DC

## 2014-12-03 MED ORDER — CEPHALEXIN 250 MG PO CAPS
500.0000 mg | ORAL_CAPSULE | Freq: Once | ORAL | Status: AC
  Administered 2014-12-03: 500 mg via ORAL
  Filled 2014-12-03: qty 2

## 2014-12-03 NOTE — ED Notes (Signed)
Dr. Zackowski at bedside  

## 2014-12-03 NOTE — ED Provider Notes (Signed)
CSN: [id number]     Arrival date & time 12/03/14  0902 History   First MD Initiated Contact with Patient 12/03/14 [phone]     Chief Complaint  Patient presents with  . Urinary Tract Infection  . Fever     (Consider location/radiation/quality/duration/timing/severity/associated sxs/prior Treatment) Patient is a 81 y.o. female presenting with urinary tract infection and fever. The history is provided by the patient and a relative. The history is limited by the condition of the patient.  Urinary Tract Infection Associated symptoms include abdominal pain.  Fever  level V caveat applies due to the patient's history of dementia. Brought in by family. Patient with some lower abdominal pain now complaint in a change in behavior not as alert appearing more fatigued. Patient has had trouble with recurrent urinary tract infections. Last antibiotic was 3 weeks ago. Family is concerned this may be the case again that there is a urinary tract infection.  Past Medical History  Diagnosis Date  . Hypertension   . GERD (gastroesophageal reflux disease)   . H/O hiatal hernia   . Arthritis   . Anemia     iron  . Syncope 07/13/2014  . UTI (lower urinary tract infection)   . Chronic kidney disease (CKD), stage III (moderate)     [name] 08/30/2014  . Bladder infection, chronic     [name] 08/30/2014  . Dementia     [name] 08/30/2014  . AV block, complete   . Chest pain   . Decreased dorsalis pedis pulse   . Hammer toe   . Hyperlipidemia   . Anemia   . Onychogryposis of toenail   . Osteoporosis   . Scoliosis   . Status post placement of implantable loop recorder   . Syncope   . Uterine disorder   . Diaphragmatic hernia   . Hx of epistaxis   . Peptic ulcer   . Chest pain    Past Surgical History  Procedure Laterality Date  . Joint replacement Left 2008  . Eye surgery Bilateral     cataracts   . Total hip arthroplasty Right 10/11/2013    Procedure: TOTAL HIP ARTHROPLASTY;  Surgeon: Nestor Lewandowsky, MD;  Location: [facility] OR;  Service: Orthopedics;  Laterality: Right;  . Loop recorder implant      [name] 08/30/2014  . Tubal ligation      [name] 01/22/2011  . Total hip arthroplasty  05/2006    [name] 01/22/2011   Family History  Problem Relation Age of Onset  . Stroke Mother 37    died of stroke  . Hypertension Mother   . Heart disease Mother    History  Substance Use Topics  . Smoking status: Never Smoker   . Smokeless tobacco: Never Used  . Alcohol Use: No   OB History    No data available     Review of Systems  Unable to perform ROS Constitutional: Positive for fever and fatigue.  Gastrointestinal: Positive for abdominal pain.   Level V caveat applies due to the patient's dementia.   Allergies  Review of patient's allergies indicates no known allergies.  Home Medications   Prior to Admission medications   Medication Sig Start Date End Date Taking? Authorizing Provider  acetaminophen (TYLENOL) 500 MG tablet Take 1 tablet (500 mg total) by mouth every 6 (six) hours as needed. 09/01/14   Glori Luis, MD  aluminum hydroxide-magnesium carbonate (GAVISCON) 95-358 MG/15ML SUSP Take by mouth.    Historical Provider, MD  amLODipine (  NORVASC) 10 MG tablet TAKE 1 TABLET (10 MG TOTAL) BY MOUTH DAILY. 09/12/14   Duke SalviaSteven C Klein, MD  aspirin EC 81 MG tablet Take 81 mg by mouth daily.    Historical Provider, MD  cephALEXin (KEFLEX) 500 MG capsule Take 1 capsule (500 mg total) by mouth 4 (four) times daily. 12/03/14   Vanetta MuldersScott Talibah Colasurdo, MD  cholecalciferol (VITAMIN D) 1000 UNITS tablet Take 1,000 Units by mouth daily.    Historical Provider, MD  conjugated estrogens (PREMARIN) vaginal cream Place 1 Applicatorful vaginally daily. 11/01/14   Uvaldo RisingKyle J Fletke, MD  CRANBERRY PO Take 240 mLs by mouth 3 (three) times daily. Cranberry concentrate solution    Historical Provider, MD  D-MANNOSE PO Take 1 tablet by mouth 2 (two) times daily.    Historical Provider, MD  ergocalciferol  (VITAMIN D2) 50000 UNITS capsule Take 50,000 Units by mouth once a week.    Historical Provider, MD  gabapentin (NEURONTIN) 100 MG capsule Take 1 capsule by mouth 3 (three) times daily. 07/28/14   Historical Provider, MD  omeprazole (PRILOSEC) 20 MG capsule Take 20 mg by mouth daily.    Historical Provider, MD  Probiotic Product (ALIGN PO) Take 1 capsule by mouth daily.    Historical Provider, MD  vitamin B-12 (CYANOCOBALAMIN) 1000 MCG tablet Take 1,000 mcg by mouth daily.    Historical Provider, MD  Wheat Dextrin (BENEFIBER) POWD Take 1 Dose by mouth daily.    Historical Provider, MD   BP 125/65 mmHg  Pulse 78  Temp(Src) 97.3 F (36.3 C) (Oral)  Resp 19  Ht 5' (1.524 m)  Wt 137 lb (62.143 kg)  BMI 26.76 kg/m2  SpO2 100% Physical Exam  Constitutional: She appears well-developed and well-nourished. No distress.  HENT:  Head: Normocephalic and atraumatic.  Eyes: Conjunctivae and EOM are normal. Pupils are equal, round, and reactive to light.  Neck: Normal range of motion. Neck supple.  Cardiovascular: Normal rate, regular rhythm and normal heart sounds.   No murmur heard. Pulmonary/Chest: Effort normal and breath sounds normal. No respiratory distress.  Abdominal: Soft. Bowel sounds are normal. There is no tenderness.  Neurological: She is alert. No cranial nerve deficit. She exhibits normal muscle tone. Coordination normal.  Patient baseline for her dementia.  Skin: Skin is warm. No rash noted.  Nursing note and vitals reviewed.   ED Course  Procedures (including critical care time) Labs Review Labs Reviewed  CBC WITH DIFFERENTIAL/PLATELET - Abnormal; Notable for the following:    Hemoglobin 11.5 (*)    HCT 35.9 (*)    Eosinophils Relative 8 (*)    Basophils Relative 2 (*)    All other components within normal limits  COMPREHENSIVE METABOLIC PANEL - Abnormal; Notable for the following:    GFR calc non Af Amer 47 (*)    GFR calc Af Amer 55 (*)    All other components within  normal limits  URINALYSIS, ROUTINE W REFLEX MICROSCOPIC - Abnormal; Notable for the following:    Nitrite POSITIVE (*)    All other components within normal limits  URINE MICROSCOPIC-ADD ON - Abnormal; Notable for the following:    Bacteria, UA FEW (*)    All other components within normal limits  URINE CULTURE   Results for orders placed or performed during the hospital encounter of 12/03/14  CBC with Differential  Result Value Ref Range   WBC 6.4 4.0 - 10.5 K/uL   RBC 3.97 3.87 - 5.11 MIL/uL   Hemoglobin 11.5 (L) 12.0 -  15.0 g/dL   HCT 40.9 (L) 81.1 - 91.4 %   MCV 90.4 78.0 - 100.0 fL   MCH 29.0 26.0 - 34.0 pg   MCHC 32.0 30.0 - 36.0 g/dL   RDW 78.2 95.6 - 21.3 %   Platelets 322 150 - 400 K/uL   Neutrophils Relative % 69 43 - 77 %   Neutro Abs 4.5 1.7 - 7.7 K/uL   Lymphocytes Relative 12 12 - 46 %   Lymphs Abs 0.8 0.7 - 4.0 K/uL   Monocytes Relative 9 3 - 12 %   Monocytes Absolute 0.6 0.1 - 1.0 K/uL   Eosinophils Relative 8 (H) 0 - 5 %   Eosinophils Absolute 0.5 0.0 - 0.7 K/uL   Basophils Relative 2 (H) 0 - 1 %   Basophils Absolute 0.1 0.0 - 0.1 K/uL  Comprehensive metabolic panel  Result Value Ref Range   Sodium 136 135 - 145 mmol/L   Potassium 4.1 3.5 - 5.1 mmol/L   Chloride 102 96 - 112 mmol/L   CO2 24 19 - 32 mmol/L   Glucose, Bld 91 70 - 99 mg/dL   BUN 12 6 - 23 mg/dL   Creatinine, Ser 0.86 0.50 - 1.10 mg/dL   Calcium 9.4 8.4 - 57.8 mg/dL   Total Protein 6.5 6.0 - 8.3 g/dL   Albumin 3.7 3.5 - 5.2 g/dL   AST 25 0 - 37 U/L   ALT 13 0 - 35 U/L   Alkaline Phosphatase 90 39 - 117 U/L   Total Bilirubin 0.6 0.3 - 1.2 mg/dL   GFR calc non Af Amer 47 (L) >90 mL/min   GFR calc Af Amer 55 (L) >90 mL/min   Anion gap 10 5 - 15  Urinalysis, Routine w reflex microscopic  Result Value Ref Range   Color, Urine YELLOW YELLOW   APPearance CLEAR CLEAR   Specific Gravity, Urine 1.007 1.005 - 1.030   pH 6.5 5.0 - 8.0   Glucose, UA NEGATIVE NEGATIVE mg/dL   Hgb urine dipstick  NEGATIVE NEGATIVE   Bilirubin Urine NEGATIVE NEGATIVE   Ketones, ur NEGATIVE NEGATIVE mg/dL   Protein, ur NEGATIVE NEGATIVE mg/dL   Urobilinogen, UA 0.2 0.0 - 1.0 mg/dL   Nitrite POSITIVE (A) NEGATIVE   Leukocytes, UA NEGATIVE NEGATIVE  Urine microscopic-add on  Result Value Ref Range   Squamous Epithelial / LPF RARE RARE   WBC, UA 0-2 <3 WBC/hpf   RBC / HPF 0-2 <3 RBC/hpf   Bacteria, UA FEW (A) RARE    Imaging Review No results found.   EKG Interpretation None      MDM   Final diagnoses:  UTI (lower urinary tract infection)    Patient with history of recurrent urinary tract infections. Been off antibiotics about 3 weeks. Urine is nitrite positive. But no significant white blood cells in the urine. Will restart on antibiotic urine culture pending we'll have her follow-up with her doctor. Patient otherwise nontoxic no acute distress. Symptoms of overall weakness have resolved patient now more herself according to family members. CBC without anemia or leukocytosis. Basic electrolytes are normal. Patient stable for discharge home.    Vanetta Mulders, MD 12/03/14 1421

## 2014-12-03 NOTE — ED Notes (Signed)
Family reports that pt has history of UTI, family feels pt has a UTI at present. Pt has odor in urine, c/o pain in lower abdomen and change in behavior.

## 2014-12-03 NOTE — Discharge Instructions (Signed)
She is to be persistent urinary tract infection. Urine culture sent we'll restart antibiotics. Follow-up with your doctor. Return for any new or worse symptoms.

## 2014-12-03 NOTE — ED Notes (Signed)
Attempted IV start to draw blood without success

## 2014-12-04 LAB — URINE CULTURE: Colony Count: 95000

## 2014-12-05 ENCOUNTER — Ambulatory Visit (INDEPENDENT_AMBULATORY_CARE_PROVIDER_SITE_OTHER): Payer: Medicare Other | Admitting: Family Medicine

## 2014-12-05 ENCOUNTER — Encounter: Payer: Self-pay | Admitting: Family Medicine

## 2014-12-05 VITALS — Temp 97.0°F | Ht 60.0 in

## 2014-12-05 DIAGNOSIS — N390 Urinary tract infection, site not specified: Secondary | ICD-10-CM | POA: Diagnosis not present

## 2014-12-05 MED ORDER — FLUTICASONE PROPIONATE 50 MCG/ACT NA SUSP: 2.0000 | Freq: Every day | NASAL | Status: AC

## 2014-12-05 MED ORDER — OMEPRAZOLE 20 MG PO CPDR: 20.0000 mg | DELAYED_RELEASE_CAPSULE | Freq: Every day | ORAL | Status: DC

## 2014-12-05 NOTE — Progress Notes (Signed)
   Subjective:    Patient ID: Victoria HelperBetty C Lewis, female    DOB: 1928-04-03, 81 y.o.   MRN: [id number]  HPI 81 y/o female presents for ED follow up.   Seen in ED on 3/26 for AMS/fatigue. Daughter thought symptoms were consistent with previous UTI. UA showed nitrate and bacteria. Treated with Keflex QID. No fevers, WBC wnl at ED. Has had some improvement of symptoms, back to baseline yesterday 3/27 however had recurrence of symptoms this AM, tolerating diet and liquids, patient denies abdominal pain/nausea/emesis  Daughter also reports few day history of cough, some rhinorrhea and mucous production   Review of Systems See above    Objective:   Physical Exam Vitals: reviewed, no fever Gen: pleasantly demented female, NAD, sitting in wheelchair, accompanied by husband and daughter HEENT: normocephalic, PERRL, EOMI, rhinorrhea present, MMM Cardiac: RRR, S1 and S2 present, no murmur Resp: CTAB, normal work of effort Abd: soft, no tenderness, normal bowel sounds   Urine culture - less than 100,000 colonies of multiple morphotypes Reviewed ED lab work     Assessment & Plan:  Please see problem specific assessment and plan.

## 2014-12-05 NOTE — Assessment & Plan Note (Signed)
Patient seen in ED on 3/26 and diagnosed with UTI. Culture showed less than 100,000 colonies of multiple morphotypes. Currently back to baseline mental status.  -complete course of Keflex -continue topical estrogen -return precautions given

## 2014-12-05 NOTE — Patient Instructions (Signed)
Omeprazole sent to pharmacy  Sent Flonase to pharmacy  Continue Keflex at the moment, please let Dr. Randolm IdolFletke know if she does not respond.

## 2014-12-06 ENCOUNTER — Encounter: Payer: Self-pay | Admitting: Internal Medicine

## 2014-12-19 ENCOUNTER — Emergency Department (HOSPITAL_COMMUNITY): Payer: Medicare Other

## 2014-12-19 ENCOUNTER — Encounter (HOSPITAL_COMMUNITY): Payer: Self-pay

## 2014-12-19 ENCOUNTER — Inpatient Hospital Stay (HOSPITAL_COMMUNITY)
Admission: EM | Admit: 2014-12-19 | Discharge: 2014-12-23 | DRG: 065 | Disposition: A | Discharge status: Home Health | Payer: Medicare Other | Attending: Family Medicine | Admitting: Family Medicine

## 2014-12-19 DIAGNOSIS — B9620 Unspecified Escherichia coli [E. coli] as the cause of diseases classified elsewhere: Secondary | ICD-10-CM | POA: Diagnosis present

## 2014-12-19 DIAGNOSIS — I63411 Cerebral infarction due to embolism of right middle cerebral artery: Secondary | ICD-10-CM | POA: Diagnosis present

## 2014-12-19 DIAGNOSIS — I639 Cerebral infarction, unspecified: Secondary | ICD-10-CM

## 2014-12-19 DIAGNOSIS — I442 Atrioventricular block, complete: Secondary | ICD-10-CM | POA: Diagnosis present

## 2014-12-19 DIAGNOSIS — M810 Age-related osteoporosis without current pathological fracture: Secondary | ICD-10-CM | POA: Diagnosis present

## 2014-12-19 DIAGNOSIS — N183 Chronic kidney disease, stage 3 unspecified: Secondary | ICD-10-CM | POA: Diagnosis present

## 2014-12-19 DIAGNOSIS — Q211 Atrial septal defect: Secondary | ICD-10-CM | POA: Diagnosis not present

## 2014-12-19 DIAGNOSIS — E785 Hyperlipidemia, unspecified: Secondary | ICD-10-CM | POA: Diagnosis present

## 2014-12-19 DIAGNOSIS — M419 Scoliosis, unspecified: Secondary | ICD-10-CM | POA: Diagnosis present

## 2014-12-19 DIAGNOSIS — I10 Essential (primary) hypertension: Secondary | ICD-10-CM | POA: Insufficient documentation

## 2014-12-19 DIAGNOSIS — J0130 Acute sphenoidal sinusitis, unspecified: Secondary | ICD-10-CM | POA: Diagnosis present

## 2014-12-19 DIAGNOSIS — I6330 Cerebral infarction due to thrombosis of unspecified cerebral artery: Secondary | ICD-10-CM | POA: Diagnosis not present

## 2014-12-19 DIAGNOSIS — M1990 Unspecified osteoarthritis, unspecified site: Secondary | ICD-10-CM | POA: Diagnosis present

## 2014-12-19 DIAGNOSIS — K219 Gastro-esophageal reflux disease without esophagitis: Secondary | ICD-10-CM | POA: Diagnosis present

## 2014-12-19 DIAGNOSIS — K5900 Constipation, unspecified: Secondary | ICD-10-CM | POA: Diagnosis present

## 2014-12-19 DIAGNOSIS — I82491 Acute embolism and thrombosis of other specified deep vein of right lower extremity: Secondary | ICD-10-CM | POA: Diagnosis present

## 2014-12-19 DIAGNOSIS — J321 Chronic frontal sinusitis: Secondary | ICD-10-CM | POA: Diagnosis present

## 2014-12-19 DIAGNOSIS — F0390 Unspecified dementia without behavioral disturbance: Secondary | ICD-10-CM | POA: Diagnosis present

## 2014-12-19 DIAGNOSIS — R29898 Other symptoms and signs involving the musculoskeletal system: Secondary | ICD-10-CM | POA: Diagnosis not present

## 2014-12-19 DIAGNOSIS — Z7902 Long term (current) use of antithrombotics/antiplatelets: Secondary | ICD-10-CM

## 2014-12-19 DIAGNOSIS — N390 Urinary tract infection, site not specified: Secondary | ICD-10-CM | POA: Diagnosis present

## 2014-12-19 DIAGNOSIS — Z72820 Sleep deprivation: Secondary | ICD-10-CM

## 2014-12-19 DIAGNOSIS — Z7982 Long term (current) use of aspirin: Secondary | ICD-10-CM | POA: Diagnosis not present

## 2014-12-19 DIAGNOSIS — R471 Dysarthria and anarthria: Secondary | ICD-10-CM | POA: Diagnosis present

## 2014-12-19 DIAGNOSIS — R32 Unspecified urinary incontinence: Secondary | ICD-10-CM | POA: Diagnosis present

## 2014-12-19 DIAGNOSIS — J0100 Acute maxillary sinusitis, unspecified: Secondary | ICD-10-CM | POA: Diagnosis present

## 2014-12-19 DIAGNOSIS — R4781 Slurred speech: Secondary | ICD-10-CM | POA: Diagnosis present

## 2014-12-19 DIAGNOSIS — I129 Hypertensive chronic kidney disease with stage 1 through stage 4 chronic kidney disease, or unspecified chronic kidney disease: Secondary | ICD-10-CM | POA: Diagnosis present

## 2014-12-19 DIAGNOSIS — Z993 Dependence on wheelchair: Secondary | ICD-10-CM | POA: Diagnosis not present

## 2014-12-19 DIAGNOSIS — I6340 Cerebral infarction due to embolism of unspecified cerebral artery: Secondary | ICD-10-CM | POA: Diagnosis not present

## 2014-12-19 DIAGNOSIS — Z8711 Personal history of peptic ulcer disease: Secondary | ICD-10-CM | POA: Diagnosis not present

## 2014-12-19 DIAGNOSIS — Z96641 Presence of right artificial hip joint: Secondary | ICD-10-CM | POA: Diagnosis present

## 2014-12-19 DIAGNOSIS — I509 Heart failure, unspecified: Secondary | ICD-10-CM | POA: Diagnosis not present

## 2014-12-19 DIAGNOSIS — R29810 Facial weakness: Secondary | ICD-10-CM | POA: Diagnosis present

## 2014-12-19 LAB — COMPREHENSIVE METABOLIC PANEL
ALT: 14 U/L (ref 0–35)
ANION GAP: 14 (ref 5–15)
AST: 24 U/L (ref 0–37)
Albumin: 4.2 g/dL (ref 3.5–5.2)
Alkaline Phosphatase: 90 U/L (ref 39–117)
BILIRUBIN TOTAL: 0.6 mg/dL (ref 0.3–1.2)
BUN: 11 mg/dL (ref 6–23)
CHLORIDE: 101 mmol/L (ref 96–112)
CO2: 21 mmol/L (ref 19–32)
Calcium: 9.5 mg/dL (ref 8.4–10.5)
Creatinine, Ser: 1.05 mg/dL (ref 0.50–1.10)
GFR calc Af Amer: 55 mL/min — ABNORMAL LOW (ref >90)
GFR, EST NON AFRICAN AMERICAN: 47 mL/min — AB (ref >90)
GLUCOSE: 91 mg/dL (ref 70–99)
POTASSIUM: 4.4 mmol/L (ref 3.5–5.1)
SODIUM: 136 mmol/L (ref 135–145)
Total Protein: 7.1 g/dL (ref 6.0–8.3)

## 2014-12-19 LAB — APTT: APTT: 27 s (ref 24–37)

## 2014-12-19 LAB — DIFFERENTIAL
BASOS ABS: 0.1 10*3/uL (ref 0.0–0.1)
BASOS PCT: 2 % — AB (ref 0–1)
Eosinophils Absolute: 0.6 10*3/uL (ref 0.0–0.7)
Eosinophils Relative: 8 % — ABNORMAL HIGH (ref 0–5)
LYMPHS PCT: 21 % (ref 12–46)
Lymphs Abs: 1.5 10*3/uL (ref 0.7–4.0)
Monocytes Absolute: 0.6 10*3/uL (ref 0.1–1.0)
Monocytes Relative: 8 % (ref 3–12)
NEUTROS ABS: 4.4 10*3/uL (ref 1.7–7.7)
Neutrophils Relative %: 61 % (ref 43–77)

## 2014-12-19 LAB — CBC
HEMATOCRIT: 36.1 % (ref 36.0–46.0)
HEMOGLOBIN: 11.7 g/dL — AB (ref 12.0–15.0)
MCH: 29.1 pg (ref 26.0–34.0)
MCHC: 32.4 g/dL (ref 30.0–36.0)
MCV: 89.8 fL (ref 78.0–100.0)
Platelets: 345 10*3/uL (ref 150–400)
RBC: 4.02 MIL/uL (ref 3.87–5.11)
RDW: 13.6 % (ref 11.5–15.5)
WBC: 7.2 10*3/uL (ref 4.0–10.5)

## 2014-12-19 LAB — URINALYSIS, ROUTINE W REFLEX MICROSCOPIC
Bilirubin Urine: NEGATIVE
Glucose, UA: NEGATIVE mg/dL
Hgb urine dipstick: NEGATIVE
Ketones, ur: NEGATIVE mg/dL
NITRITE: POSITIVE — AB
PH: 7 (ref 5.0–8.0)
Protein, ur: NEGATIVE mg/dL
SPECIFIC GRAVITY, URINE: 1.006 (ref 1.005–1.030)
UROBILINOGEN UA: 0.2 mg/dL (ref 0.0–1.0)

## 2014-12-19 LAB — I-STAT CHEM 8, ED
BUN: 14 mg/dL (ref 6–23)
Calcium, Ion: 1.18 mmol/L (ref 1.13–1.30)
Chloride: 101 mmol/L (ref 96–112)
Creatinine, Ser: 0.9 mg/dL (ref 0.50–1.10)
Glucose, Bld: 89 mg/dL (ref 70–99)
HCT: 39 % (ref 36.0–46.0)
HEMOGLOBIN: 13.3 g/dL (ref 12.0–15.0)
Potassium: 4.4 mmol/L (ref 3.5–5.1)
SODIUM: 136 mmol/L (ref 135–145)
TCO2: 22 mmol/L (ref 0–100)

## 2014-12-19 LAB — URINE MICROSCOPIC-ADD ON

## 2014-12-19 LAB — PROTIME-INR
INR: 0.97 (ref 0.00–1.49)
PROTHROMBIN TIME: 13 s (ref 11.6–15.2)

## 2014-12-19 LAB — I-STAT TROPONIN, ED: TROPONIN I, POC: 0 ng/mL (ref 0.00–0.08)

## 2014-12-19 MED ORDER — HEPARIN SODIUM (PORCINE) 5000 UNIT/ML IJ SOLN
5000.0000 [IU] | Freq: Three times a day (TID) | INTRAMUSCULAR | Status: DC
  Administered 2014-12-20 – 2014-12-22 (×8): 5000 [IU] via SUBCUTANEOUS
  Filled 2014-12-19 (×8): qty 1

## 2014-12-19 MED ORDER — ACETAMINOPHEN 325 MG PO TABS
650.0000 mg | ORAL_TABLET | Freq: Four times a day (QID) | ORAL | Status: DC | PRN
  Administered 2014-12-21 (×2): 650 mg via ORAL
  Filled 2014-12-19 (×2): qty 2

## 2014-12-19 MED ORDER — ASPIRIN EC 81 MG PO TBEC
81.0000 mg | DELAYED_RELEASE_TABLET | Freq: Every day | ORAL | Status: DC
  Filled 2014-12-19: qty 1

## 2014-12-19 MED ORDER — DEXTROSE 5 % IV SOLN
1.0000 g | Freq: Once | INTRAVENOUS | Status: AC
  Administered 2014-12-19: 1 g via INTRAVENOUS
  Filled 2014-12-19: qty 10

## 2014-12-19 MED ORDER — SODIUM CHLORIDE 0.9 % IV SOLN: 250.0000 mL | INTRAVENOUS | Status: DC | PRN

## 2014-12-19 MED ORDER — SODIUM CHLORIDE 0.9 % IJ SOLN
3.0000 mL | Freq: Two times a day (BID) | INTRAMUSCULAR | Status: DC
  Administered 2014-12-20: 3 mL via INTRAVENOUS

## 2014-12-19 MED ORDER — ACETAMINOPHEN 650 MG RE SUPP: 650.0000 mg | Freq: Four times a day (QID) | RECTAL | Status: DC | PRN

## 2014-12-19 MED ORDER — SODIUM CHLORIDE 0.9 % IJ SOLN: 3.0000 mL | INTRAMUSCULAR | Status: DC | PRN

## 2014-12-19 MED ORDER — ASPIRIN EC 81 MG PO TBEC
81.0000 mg | DELAYED_RELEASE_TABLET | Freq: Every day | ORAL | Status: DC
  Administered 2014-12-20: 81 mg via ORAL
  Filled 2014-12-19: qty 1

## 2014-12-19 MED ORDER — SODIUM CHLORIDE 0.9 % IJ SOLN
3.0000 mL | Freq: Two times a day (BID) | INTRAMUSCULAR | Status: DC
  Administered 2014-12-20 – 2014-12-22 (×3): 3 mL via INTRAVENOUS

## 2014-12-19 NOTE — ED Notes (Signed)
Paged Out Code Stroke at [phone]

## 2014-12-19 NOTE — ED Notes (Signed)
Patient taken to MRI

## 2014-12-19 NOTE — ED Notes (Signed)
MRI made aware patient is ready.

## 2014-12-19 NOTE — ED Notes (Signed)
Patient returned from MRI.

## 2014-12-19 NOTE — ED Notes (Signed)
Per Daughter, patient at home with husband and around 1400 slumped over in chair. Pt. Presents with left sided facial droop and slurred speech.

## 2014-12-19 NOTE — Consult Note (Signed)
Referring Physician: ED    Chief Complaint: code stroke, left face weakness, dysarthria, left hand weakness  HPI:                                                                                                                                         Victoria Lewis is an 81 y.o. female with a past medical history significant for HTN, hyperlipidemia, CKD stage III, dementia, syncope, complete AV block, s/p loop recorder implantation, brought in via EMS as a code stroke due to acute onset of the above stated symptoms. Patient has advanced dementia and can not contribute to her history, but according to EMS she was last seen normal by her husband around 2 pm and when she returned back to the house she was slumped over with left face weakness, left arm weakness, and slurred speech. Family is at the bedside and report that she had had similar symptoms in the context of UTI and syncope. NIHSS 5, but the only new findings are left face weakness and decreased left grip. CT brain was personally reviewed and showed no acute abnormality.  Date last known well: 12/19/14 Time last known well: 2 pm tPA Given: no, mild deficits NIHSS: 5 (but the only new findings are left face weakness and decreased left grip, thus real NIHSS 2)   Past Medical History  Diagnosis Date  . Hypertension   . GERD (gastroesophageal reflux disease)   . H/O hiatal hernia   . Arthritis   . Anemia     iron  . Syncope 07/13/2014  . UTI (lower urinary tract infection)   . Chronic kidney disease (CKD), stage III (moderate)     Archie Endo 08/30/2014  . Bladder infection, chronic     Archie Endo 08/30/2014  . Dementia     Archie Endo 08/30/2014  . AV block, complete   . Chest pain   . Decreased dorsalis pedis pulse   . Hammer toe   . Hyperlipidemia   . Anemia   . Onychogryposis of toenail   . Osteoporosis   . Scoliosis   . Status post placement of implantable loop recorder   . Syncope   . Uterine disorder   . Diaphragmatic hernia    . Hx of epistaxis   . Peptic ulcer   . Chest pain     Past Surgical History  Procedure Laterality Date  . Joint replacement Left 2008  . Eye surgery Bilateral     cataracts   . Total hip arthroplasty Right 10/11/2013    Procedure: TOTAL HIP ARTHROPLASTY;  Surgeon: Kerin Salen, MD;  Location: [place];  Service: Orthopedics;  Laterality: Right;  . Loop recorder implant      Archie Endo 08/30/2014  . Tubal ligation      Archie Endo 01/22/2011  . Total hip arthroplasty  05/2006    Archie Endo 01/22/2011    Family History  Problem Relation  Age of Onset  . Stroke Mother 69    died of stroke  . Hypertension Mother   . Heart disease Mother    Social History:  reports that she has never smoked. She has never used smokeless tobacco. She reports that she does not drink alcohol or use illicit drugs.  Family history: no epilepsy, brain tumors, or brain aneurysms.  Allergies: No Known Allergies  Medications:                                                                                                                           I have reviewed the patient's current medications.  ROS: unable to obtain due to advanced dementia                                                                                                                                      History obtained from chart review and family  General ROS: negative for - chills, fatigue, fever, night sweats, weight gain or weight loss Psychological ROS: negative for - behavioral disorder, hallucinations, mood swings or suicidal ideation Ophthalmic ROS: negative for - blurry vision, double vision, eye pain or loss of vision ENT ROS: negative for - epistaxis, nasal discharge, oral lesions, sore throat, tinnitus or vertigo Allergy and Immunology ROS: negative for - hives or itchy/watery eyes Hematological and Lymphatic ROS: negative for - bleeding problems, bruising or swollen lymph nodes Endocrine ROS: negative for - galactorrhea, hair pattern  changes, polydipsia/polyuria or temperature intolerance Respiratory ROS: negative for - cough, hemoptysis, shortness of breath or wheezing Cardiovascular ROS: negative for - chest pain, dyspnea on exertion, edema or irregular heartbeat Gastrointestinal ROS: negative for - abdominal pain, diarrhea, hematemesis, nausea/vomiting or stool incontinence Genito-Urinary ROS: negative for - dysuria, hematuria, incontinence or urinary frequency/urgency Musculoskeletal ROS: negative for - joint swelling or muscular weakness Neurological ROS: as noted in HPI Dermatological ROS: negative for rash and skin lesion changes   Physical exam: pleasant female in no apparent distress. Blood pressure 156/71, pulse 96, temperature 98.1 F (36.7 C), temperature source Oral, resp. rate 21, SpO2 97 %. Head: normocephalic. Neck: supple, no bruits, no JVD. Cardiac: no murmurs. Lungs: clear. Abdomen: soft, no tender, no mass. Extremities: no edema. Skin: no rash Neurologic Examination:  General: Mental Status: Alert, oriented, thought content appropriate.  Speech fluent without evidence of aphasia.  Able to follow 3 step commands without difficulty. Cranial Nerves: II: Discs flat bilaterally; Visual fields grossly normal, pupils equal, round, reactive to light and accommodation III,IV, VI: ptosis not present, extra-ocular motions intact bilaterally V,VII: smile asymmetric due to mild left face weakness, facial light touch sensation normal bilaterally VIII: hearing normal bilaterally IX,X: uvula rises symmetrically XI: bilateral shoulder shrug XII: midline tongue extension without atrophy or fasciculations Motor: Significant for left grip weakness. Tone and bulk:normal tone throughout; no atrophy noted Sensory: Pinprick and light touch intact throughout, bilaterally Deep Tendon Reflexes:  1+ all  over Plantars: Right: downgoing   Left: downgoing Cerebellar: normal finger-to-nose,  normal heel-to-shin test Gait:  Unable to test.    Results for orders placed or performed during the hospital encounter of 12/19/14 (from the past 48 hour(s))  Protime-INR     Status: None   Collection Time: 12/19/14  4:47 PM  Result Value Ref Range   Prothrombin Time 13.0 11.6 - 15.2 seconds   INR 0.97 0.00 - 1.49  APTT     Status: None   Collection Time: 12/19/14  4:47 PM  Result Value Ref Range   aPTT 27 24 - 37 seconds  CBC     Status: Abnormal   Collection Time: 12/19/14  4:47 PM  Result Value Ref Range   WBC 7.2 4.0 - 10.5 K/uL   RBC 4.02 3.87 - 5.11 MIL/uL   Hemoglobin 11.7 (L) 12.0 - 15.0 g/dL   HCT 36.1 36.0 - 46.0 %   MCV 89.8 78.0 - 100.0 fL   MCH 29.1 26.0 - 34.0 pg   MCHC 32.4 30.0 - 36.0 g/dL   RDW 13.6 11.5 - 15.5 %   Platelets 345 150 - 400 K/uL  Differential     Status: Abnormal   Collection Time: 12/19/14  4:47 PM  Result Value Ref Range   Neutrophils Relative % 61 43 - 77 %   Neutro Abs 4.4 1.7 - 7.7 K/uL   Lymphocytes Relative 21 12 - 46 %   Lymphs Abs 1.5 0.7 - 4.0 K/uL   Monocytes Relative 8 3 - 12 %   Monocytes Absolute 0.6 0.1 - 1.0 K/uL   Eosinophils Relative 8 (H) 0 - 5 %   Eosinophils Absolute 0.6 0.0 - 0.7 K/uL   Basophils Relative 2 (H) 0 - 1 %   Basophils Absolute 0.1 0.0 - 0.1 K/uL  Comprehensive metabolic panel     Status: Abnormal   Collection Time: 12/19/14  4:47 PM  Result Value Ref Range   Sodium 136 135 - 145 mmol/L   Potassium 4.4 3.5 - 5.1 mmol/L   Chloride 101 96 - 112 mmol/L   CO2 21 19 - 32 mmol/L   Glucose, Bld 91 70 - 99 mg/dL   BUN 11 6 - 23 mg/dL   Creatinine, Ser 1.05 0.50 - 1.10 mg/dL   Calcium 9.5 8.4 - 10.5 mg/dL   Total Protein 7.1 6.0 - 8.3 g/dL   Albumin 4.2 3.5 - 5.2 g/dL   AST 24 0 - 37 U/L   ALT 14 0 - 35 U/L   Alkaline Phosphatase 90 39 - 117 U/L   Total Bilirubin 0.6 0.3 - 1.2 mg/dL   GFR calc non Af Amer 47 (L)  >90 mL/min   GFR calc Af Amer 55 (L) >90 mL/min    Comment: (NOTE) The eGFR has been  calculated using the CKD EPI equation. This calculation has not been validated in all clinical situations. eGFR's persistently <90 mL/min signify possible Chronic Kidney Disease.    Anion gap 14 5 - 15  I-stat troponin, ED (not at [facility], [facility])     Status: None   Collection Time: 12/19/14  4:52 PM  Result Value Ref Range   Troponin i, poc 0.00 0.00 - 0.08 ng/mL   Comment 3            Comment: Due to the release kinetics of cTnI, a negative result within the first hours of the onset of symptoms does not rule out myocardial infarction with certainty. If myocardial infarction is still suspected, repeat the test at appropriate intervals.   I-Stat Chem 8, ED     Status: None   Collection Time: 12/19/14  4:54 PM  Result Value Ref Range   Sodium 136 135 - 145 mmol/L   Potassium 4.4 3.5 - 5.1 mmol/L   Chloride 101 96 - 112 mmol/L   BUN 14 6 - 23 mg/dL   Creatinine, Ser 0.90 0.50 - 1.10 mg/dL   Glucose, Bld 89 70 - 99 mg/dL   Calcium, Ion 1.18 1.13 - 1.30 mmol/L   TCO2 22 0 - 100 mmol/L   Hemoglobin 13.3 12.0 - 15.0 g/dL   HCT 39.0 36.0 - 46.0 %   Ct Head (brain) Wo Contrast  12/19/2014   CLINICAL DATA:  Slumped over in chair. Left facial droop and slurred speech.  EXAM: CT HEAD WITHOUT CONTRAST  TECHNIQUE: Contiguous axial images were obtained from the base of the skull through the vertex without intravenous contrast.  COMPARISON:  07/13/2014  FINDINGS: Small lacunar infarct in the left cerebellum, old, image 11 series 2.  Dense calcification in the globus pallidus nuclei, as before. Faint but stable hypodensity in the thalami, left greater than right.  Periventricular white matter and corona radiata hypodensities favor chronic ischemic microvascular white matter disease. There is some confluent hypodensity in both anterior external capsules, again stable.  No intracranial hemorrhage, mass lesion, or acute  CVA identified.  Calcification along the transverse ligament at C1 noted. Acute bilateral maxillary sinusitis with chronic ethmoid, and frontal sinusitis. Acute sphenoid sinusitis.  There is atherosclerotic calcification of the cavernous carotid arteries bilaterally.  IMPRESSION: 1. Chronic microvascular white matter disease. No acute intracranial findings identified. 2. Acute on chronic paranasal sinusitis. Critical Value/emergent results were called by telephone at the time of interpretation on 12/19/2014 at 5:17 pm to Dr. Aram Beecham, who verbally acknowledged these results.   Electronically Signed   By: Van Clines M.D.   On: 12/19/2014 17:17    Assessment: 81 y.o. female with HTN, hyperlipidemia, CKD stage III, and advanced dementia, brought in as a code stroke due to acute onset left face weakness, dysarthria, left hand weakness. NIHSS 5, but some of the deficits are old and the new findings on exam consist only of left face weakness and decreased left grip. Concern for a new small right brain infarct, although can not ruled out seizure, syncope, UTI as family report similar symptoms with previous syncope and UTI. Admit to medicine. Stroke work up. Aspirin. Stroke team will follow up tomorrow.  Stroke Risk Factors -  HTN, hyperlipidemia, CKD stage III  Plan: 1. HgbA1c, fasting lipid panel 2. MRI, MRA  of the brain without contrast 3. Echocardiogram 4. Carotid dopplers 5. Prophylactic therapy-aspirin 6. Risk factor modification 7. Telemetry monitoring 8. Frequent neuro checks 9. PT/OT SLP 10.  NPO until swallowing evaluation by RN nurse. [place], MD [facility] [phone]  12/19/2014, 6:39 PM

## 2014-12-19 NOTE — ED Notes (Signed)
Attempted Report x1.   

## 2014-12-19 NOTE — ED Notes (Signed)
[place], [place].

## 2014-12-19 NOTE — ED Notes (Signed)
Pt has returned from CT; pt undressed, in gown, on monitor, continuous pulse oximetry and blood pressure cuff; visitor at bedside

## 2014-12-19 NOTE — ED Provider Notes (Signed)
CSN: [id number]     Arrival date & time 12/19/14  1631 History   First MD Initiated Contact with Patient 12/19/14 1645     Chief Complaint  Patient presents with  . Stroke Symptoms    An emergency department physician performed an initial assessment on this suspected stroke patient at 6. (Consider location/radiation/quality/duration/timing/severity/associated sxs/prior Treatment) HPI Comments: 81 yo female with history of dementia who presents after sudden onset left facial weakness and left extremity weakness.  Per EMS report, her husband saw her slump over at the time.  History limited secondary to pt's dementia.  Level V Caveat applies.   Patient is a 81 y.o. female presenting with neurologic complaint.  Neurologic Problem This is a new problem. Episode onset: 2 hours PTA. The problem occurs constantly. The problem has not changed since onset.Pertinent negatives include no chest pain and no abdominal pain. Nothing aggravates the symptoms. Nothing relieves the symptoms.    Past Medical History  Diagnosis Date  . Hypertension   . GERD (gastroesophageal reflux disease)   . H/O hiatal hernia   . Arthritis   . Anemia     iron  . Syncope 07/13/2014  . UTI (lower urinary tract infection)   . Chronic kidney disease (CKD), stage III (moderate)     Victoria Lewis 08/30/2014  . Bladder infection, chronic     Victoria Lewis 08/30/2014  . Dementia     Victoria Lewis 08/30/2014  . AV block, complete   . Chest pain   . Decreased dorsalis pedis pulse   . Hammer toe   . Hyperlipidemia   . Anemia   . Onychogryposis of toenail   . Osteoporosis   . Scoliosis   . Status post placement of implantable loop recorder   . Syncope   . Uterine disorder   . Diaphragmatic hernia   . Hx of epistaxis   . Peptic ulcer   . Chest pain    Past Surgical History  Procedure Laterality Date  . Joint replacement Left 2008  . Eye surgery Bilateral     cataracts   . Total hip arthroplasty Right 10/11/2013    Procedure:  TOTAL HIP ARTHROPLASTY;  Surgeon: Nestor Lewandowsky, MD;  Location: [facility] OR;  Service: Orthopedics;  Laterality: Right;  . Loop recorder implant      Victoria Lewis 08/30/2014  . Tubal ligation      Victoria Lewis 01/22/2011  . Total hip arthroplasty  05/2006    Victoria Lewis 01/22/2011   Family History  Problem Relation Age of Onset  . Stroke Mother 55    died of stroke  . Hypertension Mother   . Heart disease Mother    History  Substance Use Topics  . Smoking status: Never Smoker   . Smokeless tobacco: Never Used  . Alcohol Use: No   OB History    No data available     Review of Systems  Cardiovascular: Negative for chest pain.  Gastrointestinal: Negative for abdominal pain.  All other systems reviewed and are negative.     Allergies  Review of patient's allergies indicates no known allergies.  Home Medications   Prior to Admission medications   Medication Sig Start Date End Date Taking? Authorizing Provider  acetaminophen (TYLENOL) 500 MG tablet Take 1 tablet (500 mg total) by mouth every 6 (six) hours as needed. 09/01/14   Glori Luis, MD  aluminum hydroxide-magnesium carbonate (GAVISCON) 95-358 MG/15ML SUSP Take by mouth.    Historical Provider, MD  amLODipine (NORVASC) 10 MG tablet TAKE  1 TABLET (10 MG TOTAL) BY MOUTH DAILY. 09/12/14   Duke SalviaSteven C Klein, MD  aspirin EC 81 MG tablet Take 81 mg by mouth daily.    Historical Provider, MD  cephALEXin (KEFLEX) 500 MG capsule Take 1 capsule (500 mg total) by mouth 4 (four) times daily. 12/03/14   Vanetta MuldersScott Zackowski, MD  cholecalciferol (VITAMIN D) 1000 UNITS tablet Take 1,000 Units by mouth daily.    Historical Provider, MD  conjugated estrogens (PREMARIN) vaginal cream Place 1 Applicatorful vaginally daily. 11/01/14   Uvaldo RisingKyle J Fletke, MD  CRANBERRY PO Take 240 mLs by mouth 3 (three) times daily. Cranberry concentrate solution    Historical Provider, MD  D-MANNOSE PO Take 1 tablet by mouth 2 (two) times daily.    Historical Provider, MD  ergocalciferol  (VITAMIN D2) 50000 UNITS capsule Take 50,000 Units by mouth once a week.    Historical Provider, MD  fluticasone (FLONASE) 50 MCG/ACT nasal spray Place 2 sprays into both nostrils daily. 12/05/14   Uvaldo RisingKyle J Fletke, MD  gabapentin (NEURONTIN) 100 MG capsule Take 1 capsule by mouth 3 (three) times daily. 07/28/14   Historical Provider, MD  omeprazole (PRILOSEC) 20 MG capsule Take 1 capsule (20 mg total) by mouth daily. 12/05/14   Uvaldo RisingKyle J Fletke, MD  Probiotic Product (ALIGN PO) Take 1 capsule by mouth daily.    Historical Provider, MD  vitamin B-12 (CYANOCOBALAMIN) 1000 MCG tablet Take 1,000 mcg by mouth daily.    Historical Provider, MD  Wheat Dextrin (BENEFIBER) POWD Take 1 Dose by mouth daily.    Historical Provider, MD   BP 150/68 mmHg  Pulse 96  Temp(Src) 98.1 F (36.7 C) (Oral)  Resp 16  SpO2 100% Physical Exam  Constitutional: She is oriented to person, place, and time. She appears well-developed and well-nourished. No distress.  HENT:  Head: Normocephalic and atraumatic.  Eyes: Conjunctivae are normal. No scleral icterus.  Neck: Neck supple.  Cardiovascular: Normal rate and intact distal pulses.   Pulmonary/Chest: Effort normal. No stridor. No respiratory distress.  Abdominal: Normal appearance. She exhibits no distension.  Neurological: She is alert and oriented to person, place, and time.  Mild left facial droop.  4/5 left grip strength, 5/5 right grip strength  Skin: Skin is warm and dry. No rash noted.  Psychiatric: She has a normal mood and affect. Her behavior is normal.  Nursing note and vitals reviewed.   ED Course  Procedures (including critical care time) Labs Review Labs Reviewed  CBC - Abnormal; Notable for the following:    Hemoglobin 11.7 (*)    All other components within normal limits  DIFFERENTIAL - Abnormal; Notable for the following:    Eosinophils Relative 8 (*)    Basophils Relative 2 (*)    All other components within normal limits  COMPREHENSIVE  METABOLIC PANEL - Abnormal; Notable for the following:    GFR calc non Af Amer 47 (*)    GFR calc Af Amer 55 (*)    All other components within normal limits  URINALYSIS, ROUTINE W REFLEX MICROSCOPIC - Abnormal; Notable for the following:    APPearance HAZY (*)    Nitrite POSITIVE (*)    Leukocytes, UA MODERATE (*)    All other components within normal limits  URINE MICROSCOPIC-ADD ON - Abnormal; Notable for the following:    Squamous Epithelial / LPF FEW (*)    Bacteria, UA MANY (*)    All other components within normal limits  URINE CULTURE  PROTIME-INR  APTT  HEMOGLOBIN A1C  LIPID PANEL  CBG MONITORING, ED  I-STAT TROPOININ, ED  I-STAT CHEM 8, ED    Imaging Review Ct Head (brain) Wo Contrast  12/19/2014   CLINICAL DATA:  Slumped over in chair. Left facial droop and slurred speech.  EXAM: CT HEAD WITHOUT CONTRAST  TECHNIQUE: Contiguous axial images were obtained from the base of the skull through the vertex without intravenous contrast.  COMPARISON:  07/13/2014  FINDINGS: Small lacunar infarct in the left cerebellum, old, image 11 series 2.  Dense calcification in the globus pallidus nuclei, as before. Faint but stable hypodensity in the thalami, left greater than right.  Periventricular white matter and corona radiata hypodensities favor chronic ischemic microvascular white matter disease. There is some confluent hypodensity in both anterior external capsules, again stable.  No intracranial hemorrhage, mass lesion, or acute CVA identified.  Calcification along the transverse ligament at C1 noted. Acute bilateral maxillary sinusitis with chronic ethmoid, and frontal sinusitis. Acute sphenoid sinusitis.  There is atherosclerotic calcification of the cavernous carotid arteries bilaterally.  IMPRESSION: 1. Chronic microvascular white matter disease. No acute intracranial findings identified. 2. Acute on chronic paranasal sinusitis. Critical Value/emergent results were called by telephone at  the time of interpretation on 12/19/2014 at 5:17 pm to Dr. Cyril Mourning, who verbally acknowledged these results.   Electronically Signed   By: Gaylyn Rong M.D.   On: 12/19/2014 17:17     EKG Interpretation   Date/Time:  Monday December 19 2014 16:38:59 EDT Ventricular Rate:  89 PR Interval:  164 QRS Duration: 72 QT Interval:  330 QTC Calculation: 401 R Axis:   158 Text Interpretation:  Normal sinus rhythm Right axis deviation Abnormal  ECG No significant change was found Confirmed by [facility]  MD, TREY ([id number])  on 12/19/2014 5:21:12 PM      MDM   Final diagnoses:  Stroke with cerebral ischemia    81 yo female with hx of dementia presenting with sudden onset slurred speech, left facial droop, and left sided weakness at approximately 2pm.  Code stroke activated at time of pt arrival to ED.  CT negative for hemorrhage.  No tPA given due to low NIHSS score.  Plan admit for further stroke workup.    Also found to have UTI.  Treated with Rocephin.  Family Medicine have admitted.    Blake Divine, MD 12/19/14 2340

## 2014-12-19 NOTE — ED Notes (Signed)
MD Wofford at the bedside.  

## 2014-12-20 ENCOUNTER — Inpatient Hospital Stay (HOSPITAL_COMMUNITY): Payer: Medicare Other

## 2014-12-20 DIAGNOSIS — I6340 Cerebral infarction due to embolism of unspecified cerebral artery: Secondary | ICD-10-CM

## 2014-12-20 DIAGNOSIS — I639 Cerebral infarction, unspecified: Secondary | ICD-10-CM

## 2014-12-20 DIAGNOSIS — E785 Hyperlipidemia, unspecified: Secondary | ICD-10-CM

## 2014-12-20 DIAGNOSIS — I6330 Cerebral infarction due to thrombosis of unspecified cerebral artery: Secondary | ICD-10-CM

## 2014-12-20 DIAGNOSIS — F0390 Unspecified dementia without behavioral disturbance: Secondary | ICD-10-CM

## 2014-12-20 DIAGNOSIS — N183 Chronic kidney disease, stage 3 (moderate): Secondary | ICD-10-CM

## 2014-12-20 DIAGNOSIS — I10 Essential (primary) hypertension: Secondary | ICD-10-CM | POA: Insufficient documentation

## 2014-12-20 LAB — LIPID PANEL
Cholesterol: 291 mg/dL — ABNORMAL HIGH (ref 0–200)
HDL: 44 mg/dL (ref >39)
LDL Cholesterol: 211 mg/dL — ABNORMAL HIGH (ref 0–99)
Total CHOL/HDL Ratio: 6.6 RATIO
Triglycerides: 178 mg/dL — ABNORMAL HIGH (ref <150)
VLDL: 36 mg/dL (ref 0–40)

## 2014-12-20 LAB — BASIC METABOLIC PANEL
ANION GAP: 8 (ref 5–15)
BUN: 8 mg/dL (ref 6–23)
CO2: 25 mmol/L (ref 19–32)
CREATININE: 0.9 mg/dL (ref 0.50–1.10)
Calcium: 8.7 mg/dL (ref 8.4–10.5)
Chloride: 100 mmol/L (ref 96–112)
GFR calc Af Amer: 66 mL/min — ABNORMAL LOW (ref >90)
GFR, EST NON AFRICAN AMERICAN: 57 mL/min — AB (ref >90)
Glucose, Bld: 84 mg/dL (ref 70–99)
Potassium: 3.8 mmol/L (ref 3.5–5.1)
Sodium: 133 mmol/L — ABNORMAL LOW (ref 135–145)

## 2014-12-20 MED ORDER — PANTOPRAZOLE SODIUM 40 MG IV SOLR
40.0000 mg | Freq: Every day | INTRAVENOUS | Status: DC
  Administered 2014-12-20: 40 mg via INTRAVENOUS
  Filled 2014-12-20: qty 40

## 2014-12-20 MED ORDER — ATORVASTATIN CALCIUM 40 MG PO TABS
40.0000 mg | ORAL_TABLET | Freq: Every day | ORAL | Status: DC
  Administered 2014-12-20 – 2014-12-22 (×3): 40 mg via ORAL
  Filled 2014-12-20 (×3): qty 1

## 2014-12-20 MED ORDER — POLYETHYLENE GLYCOL 3350 17 G PO PACK: 17.0000 g | PACK | Freq: Every day | ORAL | Status: DC | PRN

## 2014-12-20 MED ORDER — SODIUM CHLORIDE 0.45 % IV SOLN
INTRAVENOUS | Status: DC
  Administered 2014-12-20 (×2): 100 mL/h via INTRAVENOUS
  Administered 2014-12-21 – 2014-12-22 (×3): via INTRAVENOUS

## 2014-12-20 MED ORDER — SENNA 8.6 MG PO TABS: 1.0000 | ORAL_TABLET | Freq: Every day | ORAL | Status: DC | PRN

## 2014-12-20 MED ORDER — HYDRALAZINE HCL 20 MG/ML IJ SOLN: 10.0000 mg | INTRAMUSCULAR | Status: DC | PRN

## 2014-12-20 MED ORDER — CEFTRIAXONE SODIUM IN DEXTROSE 20 MG/ML IV SOLN
1.0000 g | INTRAVENOUS | Status: DC
  Administered 2014-12-20 – 2014-12-22 (×3): 1 g via INTRAVENOUS
  Filled 2014-12-20 (×4): qty 50

## 2014-12-20 MED ORDER — PANTOPRAZOLE SODIUM 40 MG PO TBEC
40.0000 mg | DELAYED_RELEASE_TABLET | Freq: Every day | ORAL | Status: DC
  Administered 2014-12-20 – 2014-12-22 (×3): 40 mg via ORAL
  Filled 2014-12-20 (×3): qty 1

## 2014-12-20 MED ORDER — HYDRALAZINE HCL 20 MG/ML IJ SOLN: 2.0000 mg | Freq: Four times a day (QID) | INTRAMUSCULAR | Status: DC | PRN

## 2014-12-20 NOTE — Progress Notes (Signed)
CARE MANAGEMENT NOTE 12/20/2014  Patient:  Victoria Lewis,Victoria Lewis   Account Number:  [id number]  Date Initiated:  12/20/2014  Documentation initiated by:  Jiles CrockerHANDLER,Conchetta Lamia  Subjective/Objective Assessment:   ADMITTED WITH STROKE     Action/Plan:   CM FOLLOWING FOR DCP   Anticipated DC Date:  12/23/2014   Anticipated DC Plan:  SKILLED NURSING FACILITY  In-house referral  Clinical Social Worker      DC Planning Services  CM consult         Status of service:  In process, will continue to follow  Per UR Regulation:  Reviewed for med. necessity/level of care/duration of stay  Comments:  4/12/2016Abelino Derrick- B Yamari Ventola RN,BSN,MHA [phone]

## 2014-12-20 NOTE — Progress Notes (Signed)
STROKE TEAM PROGRESS NOTE   HISTORY Victoria Lewis is an 81 y.o. female with a past medical history significant for HTN, hyperlipidemia, CKD stage III, dementia, syncope, complete AV block, s/p loop recorder implantation, brought in via EMS as a code stroke due to acute onset of left face weakness, dysarthria, left hand weakness. Patient has advanced dementia and can not contribute to her history, but according to EMS she was last seen normal by her husband around 2 pm 12/19/2014 and when she returned back to the house she was slumped over with left face weakness, left arm weakness, and slurred speech.Family is at the bedside and report that she had had similar symptoms in the context of UTI and syncope. NIHSS 5, but the only new findings are left face weakness and decreased left grip. CT brain was personally reviewed and showed no acute abnormality. Patient was not administered TPA secondary to mild deficits. She was admitted for further evaluation and treatment.   SUBJECTIVE (INTERVAL HISTORY) Her son Victoria Lewis is at the bedside. He reports she does not move much by herself at home, but they move her around. Most time, she is wheelchair bound. Cardiology at bedside currently interrogating her loop. No findings of Afib thus far.    OBJECTIVE Temp:  [97.8 F (36.6 C)-98.3 F (36.8 C)] 98.3 F (36.8 C) (04/12 0500) Pulse Rate:  [81-103] 84 (04/12 0500) Cardiac Rhythm:  [-] Normal sinus rhythm (04/12 0839) Resp:  [9-22] 16 (04/12 0500) BP: (105-158)/(40-81) 129/57 mmHg (04/12 0500) SpO2:  [91 %-100 %] 94 % (04/12 0500) Weight:  [62.3 kg (137 lb 5.6 oz)] 62.3 kg (137 lb 5.6 oz) (04/11 2327)  No results for input(s): GLUCAP in the last 168 hours.  Recent Labs Lab 12/19/14 1647 12/19/14 1654 12/20/14 0731  NA 136 136 133*  K 4.4 4.4 3.8  CL 101 101 100  CO2 21  --  25  GLUCOSE 91 89 84  BUN CREATININE 1.05 0.90 0.90  CALCIUM 9.5  --  8.7    Recent Labs Lab 12/19/14 1647   AST 24  ALT 14  ALKPHOS 90  BILITOT 0.6  PROT 7.1  ALBUMIN 4.2    Recent Labs Lab 12/19/14 1647 12/19/14 1654  WBC 7.2  --   NEUTROABS 4.4  --   HGB 11.7* 13.3  HCT 36.1 39.0  MCV 89.8  --   PLT 345  --    No results for input(s): CKTOTAL, CKMB, CKMBINDEX, TROPONINI in the last 168 hours.  Recent Labs  12/19/14 1647  LABPROT 13.0  INR 0.97    Recent Labs  12/19/14 1715  COLORURINE YELLOW  LABSPEC 1.006  PHURINE 7.0  GLUCOSEU NEGATIVE  HGBUR NEGATIVE  BILIRUBINUR NEGATIVE  KETONESUR NEGATIVE  PROTEINUR NEGATIVE  UROBILINOGEN 0.2  NITRITE POSITIVE*  LEUKOCYTESUR MODERATE*       Component Value Date/Time   CHOL 291* 12/20/2014 0731   TRIG 178* 12/20/2014 0731   HDL 44 12/20/2014 0731   CHOLHDL 6.6 12/20/2014 0731   VLDL 36 12/20/2014 0731   LDLCALC 211* 12/20/2014 0731   No results found for: HGBA1C No results found for: LABOPIA, COCAINSCRNUR, LABBENZ, AMPHETMU, THCU, LABBARB  No results for input(s): ETH in the last 168 hours.  I have personally reviewed the radiological images below and agree with the radiology interpretations.  Ct Head (brain) Wo Contrast 12/19/2014   1. Chronic microvascular white matter disease. No acute intracranial findings identified. 2. Acute on chronic paranasal  sinusitis.   MRI HEAD 12/19/2014    At least 2 subcentimeter foci of rete of acute ischemia in RIGHT middle cerebral artery territory. Faint reduced diffusion in RIGHT posterior frontal lobe could reflect subacute infarct or, artifact.  Moderate to severe global parenchymal brain volume loss with suspected component of normal pressure hydrocephalus.  Severe white matter changes most consistent with chronic small vessel ischemic disease. Remote bilateral basal ganglia and RIGHT greater than LEFT small cerebellar infarcts, advanced from prior imaging.  Acute on chronic moderate paranasal sinusitis.     MRA HEAD 12/19/2014   Multifocal mid to high-grade and, high-grade  stenosis involving the anterior and posterior circulation without large vessel occlusion. Findings most consistent with intracranial atherosclerosis.    EEG - This is a normal awake electroencephalogram. No epileptiform activity is noted.  2D echo - pending  CUS - pending  PHYSICAL EXAM  Temp:  [98 F (36.7 C)-99 F (37.2 C)] 98.8 F (37.1 C) (04/12 1835) Pulse Rate:  [81-116] 88 (04/12 1835) Resp:  [9-20] 18 (04/12 1835) BP: (105-154)/(40-72) 140/66 mmHg (04/12 1835) SpO2:  [91 %-97 %] 97 % (04/12 1835) Weight:  [137 lb 5.6 oz (62.3 kg)] 137 lb 5.6 oz (62.3 kg) (04/11 2327)  General - Well nourished, well developed, in no apparent distress.  Ophthalmologic - not cooperative on exam.  Cardiovascular - Regular rate and rhythm.  Mental Status -  Awake, alert, orientated to self, place and people, but not to time or situation. Limited language outpt, able to name 2/2 and repeat, follows simple commands but not complex commands.  Cranial Nerves II - XII - II - Visual field intact OU. III, IV, VI - Extraocular movements intact. V - Facial sensation intact bilaterally. VII - Facial movement intact bilaterally, but left nasolabial fold flattening. VIII - Hearing & vestibular intact bilaterally. X - Palate elevates symmetrically. XI - Chin turning & shoulder shrug intact bilaterally. XII - Tongue protrusion intact.  Motor Strength - The patient's strength was 4/5 upper extremities and pronator drift was absent, however, left hand decreased dexterity. LEs 2+/5 proximal and 3/5 distally. Bulk was normal and fasciculations were absent.   Motor Tone - Muscle tone was assessed at the neck and appendages and was normal.  Reflexes - The patient's reflexes were 1+ in all extremities and she had no pathological reflexes.  Sensory - Light touch, temperature/pinprick were assessed and were symmetrical.    Coordination - The patient had normal movements in the hands with no ataxia or  dysmetria.  Tremor was absent.  Gait and Station - not tested due to wheelchair bound.   ASSESSMENT/PLAN [name] is a 81 y.o. female with history of HTN, hyperlipidemia, CKD stage III, dementia, syncope, complete AV block, s/p loop recorder implantation presenting with left face weakness, dysarthria, left hand weakness. She did not receive IV t-PA due to mild deficits.   Stroke:  Non-dominant right MCA punctate cortical infarcts, felt to be embolic given cortical distribution, infarcts secondary to unknown source  Resultant  Left facial weakness; L hand dexterity difficulty  MRI  R MCA cortical punctate infarcts. Chronic b/l BG infarcts.   MRA  Diffuse intracranial atherosclerosis without large vessel occlusion   Carotid Doppler  pending   2D Echo  pending   EEG no seizure  Loop recorder placed early 2014 per record. Cardiology interrogated without new finding, no atrial fibrillation.   LDL 211, not at goal  HgbA1c pending  Heparin 5000 units sq tid  for VTE prophylaxis DIET DYS 3 Room service appropriate?: Yes with Assist; Fluid consistency:: Thin  aspirin 81 mg orally every day prior to admission, now on aspirin 81 mg orally every day. Given stroke on aspirin, recommend change to plavix 75 mg daily for secondary stroke prevention. If embolic source found, can consider anticoagulation at that time.  Ongoing aggressive stroke risk factor management  Therapy recommendations:  pending   Disposition:  pending   Hypertension  Home meds:   norvasc Permissive hypertension (OK if <220/120) for 24-48 hours post stroke and then gradually normalized within 5-7 days.  Stable  Hyperlipidemia  Home meds:  No statin  LDL 211, goal < 70  Put on lipitor 40mg   Continue statin at discharge  Other Stroke Risk Factors  Advanced age  Family hx stroke (mother)  Other Active Problems  Baseline dementia  Hx recurrent UTIs, positive this admission  CKD stage  III  GERD  Hx Complete AV block  constipation  Hospital day # 1  Victoria Lewis,Victoria Lewis  [facility] See Amion for Pager information 12/20/2014 10:48 AM   I, the attending vascular neurologist, have personally obtained a history, examined the patient, evaluated laboratory data, individually viewed imaging studies and agree with radiology interpretations. I also obtained additional history from pt's son at bedside. Together with the NP/PA, we formulated the assessment and plan of care which reflects our mutual decision.  I have made any additions or clarifications directly to the above note and agree with the findings and plan as currently documented.   81 yo F with hx of HTN, HLD, CKD, dementia and AV block s/p loop admitted for right MCA cortical punctate infarcts, embolic pattern but loop recorder interrogation did not show afib (of note, loop only detect afib more than 2 min). Although not able to completely ruled out afib, not felt so far there is indication for anticoagulation yet. Will recommend plavix and statin. Await other stroke work up.   Victoria PlanJindong Butch Otterson, MD PhD Stroke Neurology 12/20/2014 9:33 PM       To contact Stroke Continuity provider, please refer to [email or web address]. After hours, contact General Neurology

## 2014-12-20 NOTE — Evaluation (Addendum)
Physical Therapy Evaluation Patient Details Name: Victoria Lewis MRN: [id number] DOB: 12-26-1927 Today's Date: 12/20/2014   History of Present Illness  Victoria Lewis is an 81 y.o. female with a past medical history significant for HTN, hyperlipidemia, CKD stage III, dementia, syncope, complete AV block, s/p loop recorder implantation, brought in via EMS as a code stroke due to acute onset of left face weakness, dysarthria, left hand weakness.NIHSS 5, but the only new findings are left face weakness and decreased left grip. CT showed no acute abnormality.    MRI- 2 subcentimeter foci of rete of acute ischemia in RIGHT middle cerebral artery territory.   Clinical Impression  Patient presents with baseline dementia, generalized weakness and poor trunk control/sitting balance impacting mobility. PTA, pt requires Max A for ADLs and ambulates short distances with assist but mostly uses w/c for mobility with assist from family members to perform transfers. Pt has 24/7 S at home. Pt required Max A of 2 for transfer today. Son would like for pt to return home at d/c. If family not able to provide care based on decrease in mobility, pt may need [facility]. Pt would benefit from skilled PT to improve transfers, gait and mobility so pt can ease burden of care and minimize fall risk prior to return home.    Follow Up Recommendations Home health PT;Supervision/Assistance - 24 hour    Equipment Recommendations  None recommended by PT    Recommendations for Other Services       Precautions / Restrictions Precautions Precautions: Fall Restrictions Weight Bearing Restrictions: No      Mobility  Bed Mobility Overal bed mobility: Needs Assistance Bed Mobility: Supine to Sit;Rolling Rolling: +2 for physical assistance;Total assist   Supine to sit: Max assist;HOB elevated Sit to supine: Total assist   General bed mobility comments: Able to initiate reaching for rail and LE movement however requires  constant manual cues to complete transfer. Max A to elevate trunk and scoot bottom to EOB. Rolling to right/left with max cues.  Transfers Overall transfer level: Needs assistance Equipment used: Rolling walker (2 wheeled);1 person hand held assist Transfers: Sit to/from [employer] CorporationStand;Stand Pivot Transfers Sit to Stand: Max assist;+2 physical assistance Stand pivot transfers: Max assist;+2 physical assistance       General transfer comment: Max A of 2 to rise from EOB with manual cues for hand placement. Pt with severe posterior hip lean and forward trunk flexion. Manual cues for hip extension and upright posture. Stood from [employer] EnergyEOB x2. Max directional cues.  SPT bed to chair Max A of 2.  Ambulation/Gait Ambulation/Gait assistance: Max assist;+2 physical assistance Ambulation Distance (Feet): 2 Feet Assistive device: Rolling walker (2 wheeled) Gait Pattern/deviations: Step-to pattern;Decreased stride length;Trunk flexed;Decreased weight shift to left;Decreased weight shift to right   Gait velocity interpretation: Below normal speed for age/gender General Gait Details: Pt able to take a 2 steps forward with Max-TOtal A for weight shifting and advancement of LEs. No knee buckling noted. Difficulty initiating movement. Max verbal/manual cues.   Stairs            Wheelchair Mobility    Modified Rankin (Stroke Patients Only) Modified Rankin (Stroke Patients Only) Pre-Morbid Rankin Score: Moderately severe disability Modified Rankin: Moderately severe disability     Balance Overall balance assessment: Needs assistance Sitting-balance support: Feet supported;No upper extremity supported Sitting balance-Leahy Scale: Poor Sitting balance - Comments: Pt able to tolerate static sitting EOB for ~7 minutes with a few instances of left lateral lean  however able to self correct to upright with verbal cues. Requires Mod A initially progressing to Min guard assist. Occasional need for Min A for midline.   Postural control: Left lateral lean Standing balance support: During functional activity Standing balance-Leahy Scale: Zero                               Pertinent Vitals/Pain Pain Assessment: No/denies pain    Home Living Family/patient expects to be discharged to:: Private residence Living Arrangements: Spouse/significant other Available Help at Discharge: Available 24 hours/day;Family Type of Home: House Home Access: Ramped entrance     Home Layout: One level Home Equipment: Environmental consultant - 2 wheels;Bedside commode;Shower seat;Grab bars - toilet;Grab bars - tub/shower;Transport chair;Toilet riser Additional Comments: pt has been working with HHPT and ambulated 40' the day before admission    Prior Function Level of Independence: Needs assistance   Gait / Transfers Assistance Needed: Family assisted with bed mobility and transfers @ min A. Pt ambulated with RW. mostly sat in chair during the day.  ADL's / Homemaking Assistance Needed: Pt needed max assist for all bathing and dressing.  Comments: Pt lives with spouse who has been caring for her with intermittent assist from adult children. Pt walks short distances with walker and someone beside her at home     Hand Dominance   Dominant Hand: Right    Extremity/Trunk Assessment   Upper Extremity Assessment: Defer to OT evaluation       LUE Deficits / Details: grossly 3-/5   Lower Extremity Assessment: Generalized weakness;LLE deficits/detail;RLE deficits/detail;Difficult to assess due to impaired cognition   LLE Deficits / Details: Grossly ~3/5 throughout hip, knee, ankle. LE locked into knee extension during standing and transfer.  Cervical / Trunk Assessment: Kyphotic  Communication   Communication: HOH  Cognition Arousal/Alertness: Awake/alert Behavior During Therapy: WFL for tasks assessed/performed Overall Cognitive Status: History of cognitive impairments - at baseline       Memory: Decreased  short-term memory              General Comments General comments (skin integrity, edema, etc.): Pt's son present in room during session and assisted with mobility demonstrating technique at home.    Exercises General Exercises - Lower Extremity Long Arc Quad: Both;10 reps;Seated      Assessment/Plan    PT Assessment Patient needs continued PT services  PT Diagnosis Difficulty walking;Generalized weakness   PT Problem List Decreased strength;Decreased cognition;Decreased activity tolerance;Decreased balance;Decreased mobility;Decreased safety awareness  PT Treatment Interventions Gait training;Neuromuscular re-education;Therapeutic exercise;Therapeutic activities;Wheelchair mobility training;Functional mobility training;Patient/family education;DME instruction;Balance training   PT Goals (Current goals can be found in the Care Plan section) Acute Rehab PT Goals Patient Stated Goal: Pt's son reports the goal is to take pt home and resume 24/7 care. PT Goal Formulation: Patient unable to participate in goal setting Time For Goal Achievement: 01/03/15 Potential to Achieve Goals: Fair    Frequency Min 3X/week   Barriers to discharge        Co-evaluation               End of Session Equipment Utilized During Treatment: Gait belt Activity Tolerance: Patient tolerated treatment well;Patient limited by fatigue Patient left: in chair;with chair alarm set;with call bell/phone within reach;with family/visitor present Nurse Communication: Mobility status;Need for lift equipment (Informed tech that family can assist with transfer or wil need lift. )         Time:  1610-9604 PT Time Calculation (min) (ACUTE ONLY): 36 min   Charges:   PT Evaluation $Initial PT Evaluation Tier I: 1 Procedure PT Treatments $Therapeutic Activity: 8-22 mins   PT G CodesAlvie Heidelberg A 01-04-2015, 3:41 PM Alvie Heidelberg, PT, DPT [phone]

## 2014-12-20 NOTE — Progress Notes (Signed)
Family Medicine Teaching Service Daily Progress Note Intern Pager: [phone]  [name] Medical record number: [id number] Date of birth: [birth year] Age: 81 y.o. Gender: female  Primary Care Provider: Uvaldo Rising, MD Consultants: Neurology  Code Status: FULL per discussion with family on admission   Pt Overview and Major Events to Date:  4/11: Left sided weakness and poorly responsive   Assessment and Plan: Victoria Lewis is a 82 y.o. female presenting with focal neurological deficits concerning for CVA vs TIA. PMH is significant for recurrent UTIs, dementia, syncope with complete AV block s/p loop recorder implantation, HTN, stage III CKD, and hyperlipidemia.   Acute focal neurological deficits: Concerning for right middle cerebral artery territory ischemia given symptoms and MRI findings.  However, AMS may also be due to poor reserve and h/o behavior like this with UTIs in the past.  No new findings on loop recorder.  - Neurology following, appreciate recs -Given MRI/MRA findings, can most likely hold off on EEG, however will defer to neurology  - 2D echocardiogram and carotid dopplers may be low yield as I doubt she would undergo CAE and is likely not an anticoagulation candidate. - Start ASA per stroke team - Started atorvastatin  - Neuro checks frequently  - Lipid panel, Hb A1c obtained: Will need statin  - PT/OT/SLP - Dysphagia 3 diet per SLP  Recurrent UTIs: Has a history of VRE. Treated with keflex as outpatient with urinalysis evidence of continued colonization/infection (nitrite+, mod. leuks, many bacteria), though no systemic signs (no fevers, leukocytosis). Culture data from 11/01/2014 shows multiple drug resistant E. coli. (sensitive to cephalosporins, imipenem, gent and tobra) treated with linezolid IV then PO x 14 days. Last urine cx in our system 2wks ago revealed 95,000 colonies of multiple bacterial morphotypes.  - Given CTX x1 in ED;  -  Will continue ceftriaxone for now  -  Also consider ID consult (as the were consulted last admission) - Follow urine culture - Repeat urine culture and obtain blood cultures in the case of fever  CKD Stage III: Creatinine at baseline (~1.0) - Continue to monitor.   HTN: Normotensive thus far. - permissive hypertension for now - Restart amlodipine when taking po.  - Hydralazine  IV prn SBP >  220 or DBP > 120.   GERD:  - Pantoprazole  IV daily   History of complete AV block: ECG in ED HR 82 with significant artifact, though this is suspected to have subsided.  - Continue to monitor on telemetry (loop recorder also in place)  History of constipation: - Miralax, sennakot prn to prevent AMS (when taking po)  FEN/GI: Dysphagia  3 diet , MIVF > will discontinue once better PO intake  Prophylaxis: Subcutaneous heparin  Disposition: Pending CVA work up   Subjective:  Per patient's son, she is still not at her baseline. She's more sleepy today than usual. .  Objective: Temp:  [97.8 F (36.6 C)-98.3 F (36.8 C)] 98.3 F (36.8 C) (04/12 0500) Pulse Rate:  [81-103] 84 (04/12 0500) Resp:  [9-22] 16 (04/12 0500) BP: (105-158)/(40-81) 129/57 mmHg (04/12 0500) SpO2:  [91 %-100 %] 94 % (04/12 0500) Weight:  [137 lb 5.6 oz (62.3 kg)] 137 lb 5.6 oz (62.3 kg) (04/11 2327) Physical Exam: General: Pleasant elderly female in no distress awake sitting up in bed. HEENT: Atraumatic, conjunctivae non-injected, Dry MM, oropharynx clear Cardiovascular: Regular rate, no murmur, rubs or gallops.  Respiratory: Nonlabored, CTAB Abdomen: + BS, soft, ND/NT Extremities:  Warm and well perfused without edema or gross deformity.  Skin: No rashes or wounds noted  Neuro: Alert and oriented to person, DOB, and city/state. PERRL. No aphasia however talked slowly and quickly. Subtle diminished L hand grip strength, otherwise 5/5 and equal bilaterally in the UE. Pt able to lift legs off the bed, however  difficult to keep them up.   Laboratory:  Recent Labs Lab 12/19/14 1647 12/19/14 1654  WBC 7.2  --   HGB 11.7* 13.3  HCT 36.1 39.0  PLT 345  --     Recent Labs Lab 12/19/14 1647 12/19/14 1654  NA 136 136  K 4.4 4.4  CL 101 101  CO2 21  --   BUN 11 14  CREATININE 1.05 0.90  CALCIUM 9.5  --   PROT 7.1  --   BILITOT 0.6  --   ALKPHOS 90  --   ALT 14  --   AST 24  --   GLUCOSE 91 89   Urinalysis    Component Value Date/Time   COLORURINE YELLOW 12/19/2014 1715   APPEARANCEUR HAZY* 12/19/2014 1715   LABSPEC 1.006 12/19/2014 1715   PHURINE 7.0 12/19/2014 1715   GLUCOSEU NEGATIVE 12/19/2014 1715   HGBUR NEGATIVE 12/19/2014 1715   BILIRUBINUR NEGATIVE 12/19/2014 1715   BILIRUBINUR NEG 11/01/2014 0934   KETONESUR NEGATIVE 12/19/2014 1715   PROTEINUR NEGATIVE 12/19/2014 1715   PROTEINUR NEG 11/01/2014 0934   UROBILINOGEN 0.2 12/19/2014 1715   UROBILINOGEN 0.2 11/01/2014 0934   NITRITE POSITIVE* 12/19/2014 1715   NITRITE POSITIVE 11/01/2014 0934   LEUKOCYTESUR MODERATE* 12/19/2014 1715   7-10 WBC, few squam, many bacteria  Urine cx pending    Imaging/Diagnostic Tests:  CT Head: 1. Chronic microvascular white matter disease. No acute intracranial findings identified.2. Acute on chronic paranasal sinusitis.  MRI/MRA: MRI HEAD: At least 2 subcentimeter foci of acute ischemia in RIGHT middle cerebral artery territory. Faint reduced diffusion in RIGHT posterior frontal lobe could reflect subacute infarct or, Artifact. Moderate to severe global parenchymal brain volume loss with suspected component of normal pressure hydrocephalus. Severe white matter changes most consistent with chronic small vessel ischemic disease. Remote bilateral basal ganglia and RIGHT greater than LEFT small cerebellar infarcts, advanced from prior imaging. MRA HEAD: Multifocal mid to high-grade and, high-grade stenosis involving the anterior and posterior circulation without large vessel  occlusion. Findings most consistent with intracranial atherosclerosis.  Joanna Puffrystal S Dorsey, MD 12/20/2014, 7:04 AM PGY-1, [facility]PT[facility] Intern pager: [phone], text pages welcome

## 2014-12-20 NOTE — H&P (Signed)
Family Medicine Teaching [facility] Admission History and Physical Service Pager: [phone]  [name] Medical record number: [id number] Date of birth: 1927-10-25 Age: 81 y.o. Gender: female  Primary Care Provider: Uvaldo Rising, MD Consultants: Neurology Code Status: Full (per discussion with family)  Chief Complaint: left face and hand weakness, slurred speech.   Assessment and Plan: Victoria Lewis is a 81 y.o. female presenting with focal neurological deficits concerning for CVA vs. TIA. PMH is significant for recurrent UTIs, dementia, syncope with complete AV block s/p loop recorder implantation, HTN, stage III CKD, and hyperlipidemia.   Acute focal neurological deficits: Concerning for right cerebral hemisphere CVA (CT neg), though may be AMS due to poor reserve and UTI as in the past. Also considering syncope given history of this and AV block. Seizure also possible, though without history of epilepsy or known structural brain abnormalities.  - MRI/MRA without contrast - EEG (cannot rule out seizure with postictal paresis) - 2D echocardiogram and carotid dopplers may be low yield as I doubt she would undergo CAE and is likely not an anticoagulation candidate. Will discuss this with attending.  - Start ASA per stroke team - Neuro checks - Lipid panel, Hb A1c: Will need statin  - PT/OT/SLP - NPO pending swallow screening  Recurrent UTIs: With a history of VRE. Treated with keflex as outpatient with urinalysis evidence of continued colonization/infection (nitrite+, mod. leuks, many bacteria), though no systemic signs (no fevers, leukocytosis). Culture data from 11/01/2014 shows multiple drug resistant E. coli. (sensitive to cephalosporins and imipenem, gent and tobra) treated with linezolid IV then PO x 14 days.  - Given CTX x1 in ED; Favor starting imipenem, though will discuss with team tailoring abx from here. Also consider ID consult (were consulted last  admission)  - Follow urine culture - Repeat urine culture and obtain blood cultures in the case of fever  CKD Stage III: Creatinine at baseline.  - Continue to monitor.   HTN: Normotensive thus far. - Restart amlodipine when taking po.  - Hydralazine  IV prn SBP > 160 or DBP > 100.   GERD:  - Pantoprazole  IV daily   History of complete AV block: ECG in ED HR 82 with significant artifact, though this is suspected to have subsided.  - Continue to monitor on telemetry (loop recorder also in place)  History of constipation: - Miralax, sennakot prn to prevent AMS (when taking po)  FEN/GI: NPO, MIVF Prophylaxis: Subcutaneous heparin  Disposition: Admit for further stroke work up.   History of Present Illness: Victoria Lewis is a 81 y.o. female presenting with left-sided weakness. PMH is significant for recurrent UTIs, dementia, syncope with complete AV block s/p loop recorder implantation, HTN, stage III CKD, and hyperlipidemia.   History is obtained primarily by the patient's daughter, Victoria Lewis, and by EMR. Pt is not able to communicate the history due to dementia.   [name] was last known well around 2:00pm today sitting in her chair when her husband found her slumped over and poorly responsive. He also found her to have left face and left arm weakness as well as abnormal speech. Her daughter later found her incontinent of urine. These symptoms continued, though seemed to improve by the time she presented by EMS to the ED. Her daughter reports recurrent UTIs and recent antibiotics (keflex) given. Her urine became more foul smelling over the past few days. She's not had fevers, chills, worsening confusion, complained of SOB, palpitations,  or chest pain; no N/V/D or blood in her stools or urine. She has tolerated the medication well. These symptoms are very similar to prior episodes of UTI.   Per Victoria Lewis, her functional status is limited to very short walks with assistance and  rolling walker at home and she has become more weak recently, though she's not had any illnesses. She requires assistance with ADLs from her daughter who lives next door and her 2 sons who also live nearby. Victoria Lewis is very certain about her wish to have her mother discharged back to home following this admission with continued home health services.   Review Of Systems: Per HPI with the following additions: Unable to obtain due to advanced dementia.  Otherwise 12 point review of systems was performed and was unremarkable.  Patient Active Problem List   Diagnosis Date Noted  . Focal motor deficit 12/19/2014  . Low back pain radiating to left leg 11/01/2014  . Tachycardia   . Complete heart block 07/14/2014  . Dementia   . Chronic kidney disease (CKD), stage III (moderate) 04/11/2014  . History of urinary anomaly 03/04/2014  . Recurrent UTI 02/28/2014   Past Medical History: Past Medical History  Diagnosis Date  . Hypertension   . GERD (gastroesophageal reflux disease)   . H/O hiatal hernia   . Arthritis   . Anemia     iron  . Syncope 07/13/2014  . UTI (lower urinary tract infection)   . Chronic kidney disease (CKD), stage III (moderate)     [name] 08/30/2014  . Bladder infection, chronic     [name] 08/30/2014  . Dementia     [name] 08/30/2014  . AV block, complete   . Chest pain   . Decreased dorsalis pedis pulse   . Hammer toe   . Hyperlipidemia   . Anemia   . Onychogryposis of toenail   . Osteoporosis   . Scoliosis   . Status post placement of implantable loop recorder   . Syncope   . Uterine disorder   . Diaphragmatic hernia   . Hx of epistaxis   . Peptic ulcer   . Chest pain    Past Surgical History: Past Surgical History  Procedure Laterality Date  . Joint replacement Left 2008  . Eye surgery Bilateral     cataracts   . Total hip arthroplasty Right 10/11/2013    Procedure: TOTAL HIP ARTHROPLASTY;  Surgeon: Nestor Lewandowsky, MD;  Location: [facility] OR;  Service:  Orthopedics;  Laterality: Right;  . Loop recorder implant      [name] 08/30/2014  . Tubal ligation      [name] 01/22/2011  . Total hip arthroplasty  05/2006    [name] 01/22/2011   Social History: History  Substance Use Topics  . Smoking status: Never Smoker   . Smokeless tobacco: Never Used  . Alcohol Use: No   Additional social history: as above Please also refer to relevant sections of EMR.  Family History: Family History  Problem Relation Age of Onset  . Stroke Mother 58    died of stroke  . Hypertension Mother   . Heart disease Mother    Allergies and Medications: No Known Allergies No current facility-administered medications on file prior to encounter.   Current Outpatient Prescriptions on File Prior to Encounter  Medication Sig Dispense Refill  . acetaminophen (TYLENOL) 500 MG tablet Take 1 tablet (500 mg total) by mouth every 6 (six) hours as needed. 30 tablet 0  . aluminum hydroxide-magnesium carbonate (  GAVISCON) 95-358 MG/15ML SUSP Take 30 mLs by mouth as needed for indigestion or heartburn.     Marland Kitchen. amLODipine (NORVASC) 10 MG tablet TAKE 1 TABLET (10 MG TOTAL) BY MOUTH DAILY. 30 tablet 3  . aspirin EC 81 MG tablet Take 81 mg by mouth daily.    . cholecalciferol (VITAMIN D) 1000 UNITS tablet Take 1,000 Units by mouth daily.    Marland Kitchen. conjugated estrogens (PREMARIN) vaginal cream Place 1 Applicatorful vaginally daily. 42.5 g 2  . CRANBERRY PO Take 240 mLs by mouth 3 (three) times daily. Cranberry concentrate solution    . D-MANNOSE PO Take 1 tablet by mouth 2 (two) times daily.    . ergocalciferol (VITAMIN D2) 50000 UNITS capsule Take 50,000 Units by mouth once a week.    . fluticasone (FLONASE) 50 MCG/ACT nasal spray Place 2 sprays into both nostrils daily. 16 g 6  . gabapentin (NEURONTIN) 100 MG capsule Take 1 capsule by mouth 3 (three) times daily.  2  . omeprazole (PRILOSEC) 20 MG capsule Take 1 capsule (20 mg total) by mouth daily. 90 capsule 1  . vitamin B-12  (CYANOCOBALAMIN) 1000 MCG tablet Take 1,000 mcg by mouth daily.    . Wheat Dextrin (BENEFIBER) POWD Take 1 Dose by mouth daily.    . cephALEXin (KEFLEX) 500 MG capsule Take 1 capsule (500 mg total) by mouth 4 (four) times daily. 28 capsule 0    Objective: BP 127/65 mmHg  Pulse 81  Temp(Src) 98 F (36.7 C) (Oral)  Resp 18  Ht 5\' 4"  (1.626 m)  Wt 137 lb 5.6 oz (62.3 kg)  BMI 23.56 kg/m2  SpO2 97% Exam: General: Pleasant elderly female in no distress in stretcher HEENT: Atraumatic, normal eyes, oropharynx clear Cardiovascular: Regular rate, no murmur, no JVD Respiratory: Nonlabored, CTAB Abdomen: + BS, soft, very mildly somewhat tender generally to deep palpation without rebound or guarding, nondistended Extremities: Warm and well perfused without edema or gross deformity Skin: No rashes or wounds noted Neuro: Alert and oriented to person and place. PERRL. No aphasia. Subtle diminished L hand grip strength, otherwise 5/5 throughout. Patellar and brachioradialis DTRs 1+ bilaterally. No dysdiadochokinesia or dysmetria.   Labs and Imaging: CBC BMET   Recent Labs Lab 12/19/14 1647 12/19/14 1654  WBC 7.2  --   HGB 11.7* 13.3  HCT 36.1 39.0  PLT 345  --     Recent Labs Lab 12/19/14 1647 12/19/14 1654  NA 136 136  K 4.4 4.4  CL 101 101  CO2 21  --   BUN 11 14  CREATININE 1.05 0.90  GLUCOSE 91 89  CALCIUM 9.5  --      CT Head (Brain) Wo Contrast   Narrative   CLINICAL DATA:  Slumped over in chair. Left facial droop and slurred speech.  EXAM: CT HEAD WITHOUT CONTRAST  TECHNIQUE: Contiguous axial images were obtained from the base of the skull through the vertex without intravenous contrast.  COMPARISON:  07/13/2014  FINDINGS: Small lacunar infarct in the left cerebellum, old, image 11 series 2.  Dense calcification in the globus pallidus nuclei, as before. Faint but stable hypodensity in the thalami, left greater than right.  Periventricular white matter  and corona radiata hypodensities favor chronic ischemic microvascular white matter disease. There is some confluent hypodensity in both anterior external capsules, again stable.  No intracranial hemorrhage, mass lesion, or acute CVA identified.  Calcification along the transverse ligament at C1 noted. Acute bilateral maxillary sinusitis with chronic ethmoid, and  frontal sinusitis. Acute sphenoid sinusitis.  There is atherosclerotic calcification of the cavernous carotid arteries bilaterally.  IMPRESSION: 1. Chronic microvascular white matter disease. No acute intracranial findings identified. 2. Acute on chronic paranasal sinusitis. Critical Value/emergent results were called by telephone at the time of interpretation on 12/19/2014 at 5:17 pm to Dr. Cyril Mourning, who verbally acknowledged these results.   Electronically Signed   By: Gaylyn Rong M.D.   On: 12/19/2014 17:17      Tyrone Nine, MD 12/20/2014, 12:15 AM PGY-2, [facility] FPTS Intern pager: [phone], text pages welcome

## 2014-12-20 NOTE — Progress Notes (Signed)
FPTS Interim Progress Note  S: Patient denies any pain currently. Son states she is still not herself; her speech is not as clear and is softer.  O: BP 122/57 mmHg  Pulse 116  Temp(Src) 98.6 F (37 C) (Oral)  Resp 18  Ht 5\' 4"  (1.626 m)  Wt 137 lb 5.6 oz (62.3 kg)  BMI 23.56 kg/m2  SpO2 97%  Gen: Pleasant elderly female in NAD Abd: +BS, soft, ND/NT to palpation. Neuro: Left sided facial droop most noticeable with speaking and smile. PEERL, EOMI, slightly decreased grip strength on the L, Pt still able to pick her legs off the bed, however unable at assess strength as the pt drops her legs immediately after picking them up (unsure if it is secondary to comprehension vs strength.)  A/P: - EEG revealed normal awake EEG, however stated a sleep deprived EEG may further elicit seizure disorder >will hold off on this for now - Has carotid dopplers and echo pending > this may help determine whether to continue atorvastatin in the future - Updated son at bedside  Joanna Puffrystal S Dayzha Pogosyan, MD 12/20/2014, 3:10 PM PGY-1, [facility]

## 2014-12-20 NOTE — Evaluation (Signed)
Occupational Therapy Evaluation Patient Details Name: Victoria Lewis MRN: [id number] DOB: 13-Mar-1928 Today's Date: 12/20/2014    History of Present Illness Victoria HelperBetty C Fromme is an 81 y.o. female with a past medical history significant for HTN, hyperlipidemia, CKD stage III, dementia, syncope, complete AV block, s/p loop recorder implantation, brought in via EMS as a code stroke due to acute onset of left face weakness, dysarthria, left hand weakness.NIHSS 5, but the only new findings are left face weakness and decreased left grip. CT showed no acute abnormality.     Clinical Impression   Pt admitted with the above diagnoses and presents with below problem list. Pt will benefit from continued acute OT to address the below listed deficits and maximize independence with BADLs prior to d/c. PTA pt was max A for bathing/dressing, ambulated short distances (bed to w/c) with walker and assistance of 1 person. Currently pt is +2 assist for bathing/dressing. Discussed with son SNF vs. home with family assist. OT to continue to follow acutely.      Follow Up Recommendations  SNF;Supervision/Assistance - 24 hour    Equipment Recommendations  Other (comment) (TBD)    Recommendations for Other Services       Precautions / Restrictions Precautions Precautions: Fall Restrictions Weight Bearing Restrictions: No      Mobility Bed Mobility Overal bed mobility: Needs Assistance Bed Mobility: Supine to Sit;Sit to Supine     Supine to sit: Total assist;HOB elevated Sit to supine: Total assist   General bed mobility comments: Pt needing total assist at trunk and BLE to complete sit<>EOB.  Transfers                 General transfer comment: Did not attempt- likely +2 due to poor sitting balance EOB.    Balance Overall balance assessment: Needs assistance Sitting-balance support: Bilateral upper extremity supported;Feet supported Sitting balance-Leahy Scale: Poor Sitting balance -  Comments: needs max A to sit EOB; unable to achieve upright, midline posture with verbal and tactile cues provided. Fatigued quickly ~2 minutes Postural control: Posterior lean                                  ADL Overall ADL's : Needs assistance/impaired Eating/Feeding: Set up;Sitting   Grooming: Minimal assistance;Sitting   Upper Body Bathing: Maximal assistance;Sitting   Lower Body Bathing: Maximal assistance;+2 for physical assistance;Sit to/from stand   Upper Body Dressing : Sitting;Maximal assistance   Lower Body Dressing: Maximal assistance;+2 for physical assistance;Sit to/from stand   Toilet Transfer: Maximal assistance;+2 for physical assistance;Squat-pivot;BSC   Toileting- Clothing Manipulation and Hygiene: Maximal assistance;+2 for physical assistance;Sit to/from stand   Tub/ Shower Transfer: Maximal assistance;+2 for physical assistance;Squat-pivot;3 in 1;Stand-pivot     General ADL Comments: Pt with decreased sitting balance EOB needing max A with posterior lean. Would likely need +2 physical assist for OOB ADLs. Discussed with son SNF at d/c due to need for +2 assist; he plans to discuss with family and decide from there.     Vision     Perception     Praxis      Pertinent Vitals/Pain Pain Assessment: No/denies pain     Hand Dominance Right   Extremity/Trunk Assessment Upper Extremity Assessment Upper Extremity Assessment: Generalized weakness;LUE deficits/detail LUE Deficits / Details: grossly 3-/5   Lower Extremity Assessment Lower Extremity Assessment: Defer to PT evaluation   Cervical / Trunk Assessment Cervical / Trunk Assessment:  Kyphotic   Communication Communication Communication: HOH   Cognition Arousal/Alertness: Awake/alert Behavior During Therapy: WFL for tasks assessed/performed Overall Cognitive Status: History of cognitive impairments - at baseline       Memory: Decreased short-term memory              General Comments       Exercises       Shoulder Instructions      Home Living Family/patient expects to be discharged to:: Private residence Living Arrangements: Spouse/significant other Available Help at Discharge: Available 24 hours/day;Family Type of Home: House Home Access: Ramped entrance     Home Layout: One level     Bathroom Shower/Tub: Producer, television/film/video: Handicapped height Bathroom Accessibility: Yes How Accessible: Accessible via wheelchair Home Equipment: Walker - 2 wheels;Bedside commode;Shower seat;Grab bars - toilet;Grab bars - tub/shower;Transport chair;Toilet riser   Additional Comments: pt has been working with HHPT and ambulated 40' the day before admission      Prior Functioning/Environment Level of Independence: Needs assistance  Gait / Transfers Assistance Needed: Family assisted with bed mobility and transfers @ min A. Pt ambulated with RW. mostly sat in chair during the day. ADL's / Homemaking Assistance Needed: Pt needed max assist for all bathing and dressing. Communication / Swallowing Assistance Needed: HOH; wears hearing aids Comments: Pt lives with spouse who has been caring for her with intermittent assist from adult children. Pt walks short distances with walker and someone beside her at home then     OT Diagnosis: Generalized weakness;Cognitive deficits;Hemiplegia non-dominant side   OT Problem List: Decreased strength;Decreased range of motion;Decreased activity tolerance;Impaired balance (sitting and/or standing);Decreased coordination;Decreased cognition;Decreased safety awareness;Decreased knowledge of use of DME or AE;Decreased knowledge of precautions;Impaired UE functional use   OT Treatment/Interventions: Self-care/ADL training;Therapeutic exercise;Neuromuscular education;Energy conservation;DME and/or AE instruction;Therapeutic activities;Cognitive remediation/compensation;Patient/family education;Balance training     OT Goals(Current goals can be found in the care plan section) Acute Rehab OT Goals Patient Stated Goal: not stated OT Goal Formulation: With patient/family Time For Goal Achievement: 01/03/15 Potential to Achieve Goals: Fair ADL Goals Pt Will Perform Grooming: with min guard assist;sitting Pt Will Perform Lower Body Dressing: with adaptive equipment;sit to/from stand;with max assist (+1) Pt Will Transfer to Toilet: with max assist;stand pivot transfer;bedside commode (+1) Pt Will Perform Toileting - Clothing Manipulation and hygiene: with max assist;sit to/from stand;with adaptive equipment (+1)  OT Frequency: Min 2X/week   Barriers to D/C:            Co-evaluation              End of Session Nurse Communication: Mobility status  Activity Tolerance: Patient limited by fatigue;Patient tolerated treatment well Patient left: in bed;with call bell/phone within reach;with bed alarm set;with family/visitor present   Time: 1220-1240 OT Time Calculation (min): 20 min Charges:  OT General Charges $OT Visit: 1 Procedure OT Evaluation $Initial OT Evaluation Tier I: 1 Procedure G-Codes:    Pilar Grammes Jan 15, 2015, 1:24 PM

## 2014-12-20 NOTE — Procedures (Signed)
ELECTROENCEPHALOGRAM REPORT   Patient: Victoria Lewis       Room #: 0A544N20 EEG No. ID: [id number] Age: 81 y.o.        Sex: female Referring Physician: Jennette KettleNeal Report Date:  12/20/2014        Interpreting Physician: Thana FarrEYNOLDS, Tanisa Lagace  History: Victoria Lewis is an 81 y.o. female with syncope evaluated to rule out seizure  Medications:  Scheduled: . aspirin EC  81 mg Oral Daily  . atorvastatin  40 mg Oral q1800  . cefTRIAXone (ROCEPHIN)  IV  1 g Intravenous Q24H  . heparin  5,000 Units Subcutaneous 3 times per day  . pantoprazole (PROTONIX) IV  40 mg Intravenous QHS  . sodium chloride  3 mL Intravenous Q12H    Conditions of Recording:  This is a 16 channel EEG carried out with the patient in the awake state.  Description:  The waking background activity consists of a low voltage, symmetrical, fairly well organized, 10 Hz alpha activity, seen from the parieto-occipital and posterior temporal regions.  Low voltage fast activity, poorly organized, is seen anteriorly and is at times superimposed on more posterior regions.  A mixture of theta and alpha rhythms are seen from the central and temporal regions. The patient does not drowse or sleep. No epileptiform activity is noted. Hyperventilation and intermittent photic stimulation were not performed.   IMPRESSION: This is a normal awake electroencephalogram.  No epileptiform activity is noted.    Comment:  An EEG with the patient sleep deprived to elicit drowse and light sleep may be desirable to further elicit a possible seizure disorder.     Thana FarrLeslie Nazifa Trinka, MD [facility] [phone] 12/20/2014, 11:43 AM

## 2014-12-20 NOTE — Progress Notes (Signed)
EEG Completed; Results Pending  

## 2014-12-20 NOTE — Evaluation (Signed)
Clinical/Bedside Swallow Evaluation Patient Details  Name: Victoria Lewis MRN: [id number] Date of Birth: [birth year]  Today's Date: 12/20/2014 Time: SLP Start Time (ACUTE ONLY): 0810 (session paused for MD to evaluate pt) SLP Stop Time (ACUTE ONLY): 0856 SLP Time Calculation (min) (ACUTE ONLY): 46 min  Past Medical History:  Past Medical History  Diagnosis Date  . Hypertension   . GERD (gastroesophageal reflux disease)   . H/O hiatal hernia   . Arthritis   . Anemia     iron  . Syncope 07/13/2014  . UTI (lower urinary tract infection)   . Chronic kidney disease (CKD), stage III (moderate)     [name]/notes 08/30/2014  . Bladder infection, chronic     [name]/notes 08/30/2014  . Dementia     [name]/notes 08/30/2014  . AV block, complete   . Chest pain   . Decreased dorsalis pedis pulse   . Hammer toe   . Hyperlipidemia   . Anemia   . Onychogryposis of toenail   . Osteoporosis   . Scoliosis   . Status post placement of implantable loop recorder   . Syncope   . Uterine disorder   . Diaphragmatic hernia   . Hx of epistaxis   . Peptic ulcer   . Chest pain    Past Surgical History:  Past Surgical History  Procedure Laterality Date  . Joint replacement Left 2008  . Eye surgery Bilateral     cataracts   . Total hip arthroplasty Right 10/11/2013    Procedure: TOTAL HIP ARTHROPLASTY;  Surgeon: Nestor LewandowskyFrank J Rowan, MD;  Location: [facility] OR;  Service: Orthopedics;  Laterality: Right;  . Loop recorder implant      [name]/notes 08/30/2014  . Tubal ligation      [name]/notes 01/22/2011  . Total hip arthroplasty  05/2006    [name]/notes 01/22/2011   HPI:  81 yo [facility].  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  Swallow evaluation ordered as pt failed screen.  Son reports pt failed RN screen due to inability to drink from a cup.  MRI showed acute ischemia right MCA region, subacute vs acute right posterior frontal , paranasal  sinusitis.     Assessment / Plan / Recommendation Clinical Impression  Pt presents with mild oral dysphagia c/b decreased labial closure and delayed oral transiting.  Dysarthria apparent with CN involvement impacting swallowing ability.   Suspect mild delay in pharyngeal swallow but no s/s of aspiration apparent.  SLP can not rule out silent aspiration at bedside.  At times pt benefited from verbal cues to swallow=likely due to her cognition.  Recommend initiate a dys3/thin diet with strict precautions due to pt's dysphagia and weakness.    Son Lorin PicketScott present and both pt/son educated to findings, recommendations.  Son reports pt's voice/cough is not as strong as normal but was not fully strong at baseline.  Lorin PicketScott also stated pt has been told she is showing signs of Parkinson's disease.    Will follow up briefly for further dysphagia management, readiness for dietary advancement.  Pt will benefit from full supervision initially for maximal airway protection- relayed information to RN.      Aspiration Risk  Mild    Diet Recommendation Dysphagia 3 (Mechanical Soft);Thin liquid   Liquid Administration via: Cup;Straw Medication Administration: Whole meds with puree Supervision: Full supervision/cueing for compensatory strategies;Trained caregiver to feed patient Compensations: Slow rate;Small sips/bites;Check for pocketing Postural Changes  and/or Swallow Maneuvers: Seated upright 90 degrees;Upright 30-60 min after meal    Other  Recommendations Oral Care Recommendations: Oral care BID   Follow Up Recommendations    TBD   Frequency and Duration min 2x/week  2 weeks   Pertinent Vitals/Pain Afebrile, decreased      Swallow Study Prior Functional Status   no dysphagia prior to admit per pt/son    General Date of Onset: 12/20/14 HPI: 81 yo female adm to [facility] with recurrent UTI.  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  Swallow  evaluation ordered as pt failed screen.  Son reports pt failed RN screen due to inability to drink from a cup.  MRI showed acute ischemia right MCA region, subacute vs acute right posterior frontal , paranasal sinusitis.   Type of Study: Bedside swallow evaluation Diet Prior to this Study: NPO Temperature Spikes Noted: No Respiratory Status: Room air History of Recent Intubation: No Behavior/Cognition: Alert;Cooperative;Pleasant mood Oral Cavity - Dentition: Adequate natural dentition Patient Positioning: Upright in bed Baseline Vocal Quality: Wet;Hoarse;Breathy Volitional Cough: Weak Volitional Swallow: Unable to elicit    Oral/Motor/Sensory Function Lingual Strength: Reduced (? lingual deviation to left upon protrusion) Velum: Within Functional Limits Mandible: Within Functional Limits   Ice Chips Ice chips: Impaired Presentation: Spoon Oral Phase Impairments: Impaired anterior to posterior transit;Reduced lingual movement/coordination Oral Phase Functional Implications: Prolonged oral transit Pharyngeal Phase Impairments: Suspected delayed Swallow   Thin Liquid Thin Liquid: Impaired Presentation: Cup;Straw;Spoon;Self Fed Oral Phase Impairments: Reduced labial seal;Reduced lingual movement/coordination;Impaired anterior to posterior transit Oral Phase Functional Implications: Prolonged oral transit Pharyngeal  Phase Impairments: Suspected delayed Swallow    Nectar Thick Nectar Thick Liquid: Impaired Presentation: Cup;Self Fed;Spoon;Straw Oral Phase Impairments: Impaired anterior to posterior transit;Reduced labial seal;Reduced lingual movement/coordination Oral phase functional implications: Prolonged oral transit Pharyngeal Phase Impairments: Suspected delayed Swallow   Honey Thick Honey Thick Liquid: Not tested   Puree Puree: Impaired Presentation: Spoon;Self Fed Oral Phase Impairments: Impaired anterior to posterior transit;Reduced lingual movement/coordination Oral Phase  Functional Implications: Prolonged oral transit Pharyngeal Phase Impairments: Suspected delayed Swallow   Solid   GO    Solid: Impaired Presentation: Self Fed Oral Phase Impairments: Reduced lingual movement/coordination;Impaired anterior to posterior transit;Impaired mastication Oral Phase Functional Implications: Other (comment) (prolonged oral transiting) Pharyngeal Phase Impairments: Suspected delayed Fredric Mare, MS [facility] SLP [phone]

## 2014-12-21 DIAGNOSIS — I509 Heart failure, unspecified: Secondary | ICD-10-CM

## 2014-12-21 DIAGNOSIS — R29898 Other symptoms and signs involving the musculoskeletal system: Secondary | ICD-10-CM

## 2014-12-21 DIAGNOSIS — I639 Cerebral infarction, unspecified: Secondary | ICD-10-CM

## 2014-12-21 DIAGNOSIS — N390 Urinary tract infection, site not specified: Secondary | ICD-10-CM

## 2014-12-21 LAB — HEMOGLOBIN A1C
Hgb A1c MFr Bld: 5.7 % — ABNORMAL HIGH (ref 4.8–5.6)
MEAN PLASMA GLUCOSE: 117 mg/dL

## 2014-12-21 LAB — CBC
HCT: 32.4 % — ABNORMAL LOW (ref 36.0–46.0)
Hemoglobin: 10.6 g/dL — ABNORMAL LOW (ref 12.0–15.0)
MCH: 29.4 pg (ref 26.0–34.0)
MCHC: 32.7 g/dL (ref 30.0–36.0)
MCV: 89.8 fL (ref 78.0–100.0)
Platelets: 298 10*3/uL (ref 150–400)
RBC: 3.61 MIL/uL — ABNORMAL LOW (ref 3.87–5.11)
RDW: 13.6 % (ref 11.5–15.5)
WBC: 6.6 10*3/uL (ref 4.0–10.5)

## 2014-12-21 LAB — BASIC METABOLIC PANEL
Anion gap: 9 (ref 5–15)
BUN: 7 mg/dL (ref 6–23)
CALCIUM: 8.7 mg/dL (ref 8.4–10.5)
CO2: 24 mmol/L (ref 19–32)
Chloride: 104 mmol/L (ref 96–112)
Creatinine, Ser: 0.83 mg/dL (ref 0.50–1.10)
GFR calc Af Amer: 72 mL/min — ABNORMAL LOW (ref >90)
GFR, EST NON AFRICAN AMERICAN: 63 mL/min — AB (ref >90)
GLUCOSE: 86 mg/dL (ref 70–99)
POTASSIUM: 3.4 mmol/L — AB (ref 3.5–5.1)
SODIUM: 137 mmol/L (ref 135–145)

## 2014-12-21 MED ORDER — GABAPENTIN 100 MG PO CAPS
100.0000 mg | ORAL_CAPSULE | Freq: Three times a day (TID) | ORAL | Status: DC
Start: 2014-12-21 — End: 2014-12-23
  Administered 2014-12-21 – 2014-12-23 (×7): 100 mg via ORAL
  Filled 2014-12-21 (×7): qty 1

## 2014-12-21 MED ORDER — CLOPIDOGREL BISULFATE 75 MG PO TABS
75.0000 mg | ORAL_TABLET | Freq: Every day | ORAL | Status: DC
  Administered 2014-12-21 – 2014-12-22 (×2): 75 mg via ORAL
  Filled 2014-12-21 (×2): qty 1

## 2014-12-21 NOTE — Discharge Summary (Signed)
Family Medicine [facility] Discharge Summary  [name] Medical record number: [id number] Date of birth: [birth year] Age: 81 y.o. Gender: female Date of Admission: 12/19/2014  Date of Discharge: 12/23/14 Admitting Physician: Nestor Ramp, MD  Primary Care Provider: Uvaldo Rising, MD Consultants: Neurology  Indication for Hospitalization: Unilateral weakness, CVA   Discharge Diagnoses/Problem List:  Right cerebral artery infarct Urinary tract infection, Ecoli and Entercoccus  Hypertension  History of AV block  Dementia Hyperlipidemia   Disposition: Home with HH-RN and PT (family refused SNF and pt has 24hr supervision)  Discharge Condition: Stable, improved  Discharge Exam:  Blood pressure 135/62, pulse 86, temperature 98.7 F (37.1 C), temperature source Oral, resp. rate 18, height  (1.626 m), weight 137 lb 5.6 oz (62.3 kg), SpO2 100 %. General: 81yo female lying in bed comfortably in no apparent distress Cardiovascular: Regular rate, no murmur, rubs or gallops. 1+ DP pulses bilaterally.  Respiratory: Nonlabored, CTAB. No wheezing, rhonchi, or crackles noted. Abdomen: + BS, soft, ND/NT.  Extremities: Warm and well perfused without edema or gross deformity.  Neuro: Alert and oriented to person and place. Smiles throughout the exam. Smile symmetric. Tongue and uvula midline. PERRL. EOMI. Speech clear. Muscle strength 5/5 bilaterally in upper and lower extremities.   Brief Hospital Course:  Mrs. Stemler is a 81 y.o. female with a PMH of recurrent UTIs, dementia, syncope with complete AV block s/p loop recorder implantation, HTN, stage III CKD, and hyperlipidemia who presented with left-sided upper extremity weakness, facial droop, and decreased responsiveness (which had began to improve on arrival). CT of the head revealed chronic microvascular white matter disease with no acute intracranial findings noted. Stroke team was consulted,  however tPA was not administered as she only had mild deficits.   She was admitted to the FMTS for additional evaluation and management. MRI/MRA of the head revealed a 2cm foci of acute ischemia in the right MCA territory and mid to high grade stenosis involving the anterior and posterior circulation without large vessel occlusion. She was started on Lipitor.  Given she had a stroke on ASA, she was switched to Plavix for secondary prevention.  She had a normal awake EEG without epileptiform activity. Echocardiogram showed mild concentric hypertrophy of the LV, and EF 55-60% with no RMWA and carotid dopplers revealed 1-39% stenosis of the ICAs bilaterally. Given the cortical distribution, neurology had a strong suspicion for LE DVT in the setting of a PFO. She was noted to have an acute DVT in the mid right mid femoral vein extending into the right calf veins and bubble study did reveal a PFO. ASA and Plavix were discontinued and the patient was started on Eliquis.  Additionally, the family was concerned that the patient may have a UTI, as her behavior is sometimes odd with UTIs and she developed a foul smelling urine several days prior to presentation. She was started on CTX in the ED which was continued. It was difficult to ascertain whether the patient's AMS was secondary to UTI or whether she had asymptomatic bacteruria with AMS due to the CVA. Given the patient's urine culture grew >100,000 colonies of both E. coli and Enterococcus and the family's concern, she was discharged with treatment for both: 5 more days of ceftriaxone 1gm IM via HH-RN to complete a 10d course to cover Ecoli, and amoxicillin  TID x 10 days to cover Enterococcus.    Issues for Follow Up:  1. Patient's estrogen vaginal cream was  discontinued on discharge, if the patient's frequency of UTIs increase could consider re-starting this (I doubt there is much systemic absorption).  2. Given acute DVT, patient should be continued on  Eliquis x 6 months   Significant Procedures: None  Significant Labs and Imaging:   Recent Labs Lab 12/19/14 1647 12/19/14 1654 12/21/14 0453  WBC 7.2  --  6.6  HGB 11.7* 13.3 10.6*  HCT 36.1 39.0 32.4*  PLT 345  --  298    Recent Labs Lab 12/19/14 1647 12/19/14 1654 12/20/14 0731 12/21/14 0453  NA 136 136 133* 137  K 4.4 4.4 3.8 3.4*  CL 101 101 100 104  CO2 21  --  25 24  GLUCOSE 91 89 84 86  BUN CREATININE 1.05 0.90 0.90 0.83  CALCIUM 9.5  --  8.7 8.7  ALKPHOS 90  --   --   --   AST 24  --   --   --   ALT 14  --   --   --   ALBUMIN 4.2  --   --   --    Urinalysis    Component Value Date/Time   COLORURINE YELLOW 12/19/2014 1715   APPEARANCEUR HAZY* 12/19/2014 1715   LABSPEC 1.006 12/19/2014 1715   PHURINE 7.0 12/19/2014 1715   GLUCOSEU NEGATIVE 12/19/2014 1715   HGBUR NEGATIVE 12/19/2014 1715   BILIRUBINUR NEGATIVE 12/19/2014 1715   BILIRUBINUR NEG 11/01/2014 0934   KETONESUR NEGATIVE 12/19/2014 1715   PROTEINUR NEGATIVE 12/19/2014 1715   PROTEINUR NEG 11/01/2014 0934   UROBILINOGEN 0.2 12/19/2014 1715   UROBILINOGEN 0.2 11/01/2014 0934   NITRITE POSITIVE* 12/19/2014 1715   NITRITE POSITIVE 11/01/2014 0934   LEUKOCYTESUR MODERATE* 12/19/2014 1715  7-10 WBC, few squam, many bacteria  Urine culture: >100,000 colonies of Enterococcus. >100,000 colonies E. coli   CT Head: 1. Chronic microvascular white matter disease. No acute intracranial findings identified.2. Acute on chronic paranasal sinusitis.  MRI/MRA: MRI HEAD: At least 2 subcentimeter foci of acute ischemia in RIGHT middle cerebral artery territory. Faint reduced diffusion in RIGHT posterior frontal lobe could reflect subacute infarct or, Artifact. Moderate to severe global parenchymal brain volume loss with suspected component of normal pressure hydrocephalus. Severe white matter changes most consistent with chronic small vessel ischemic disease. Remote bilateral basal ganglia and  RIGHT greater than LEFT small cerebellar infarcts, advanced from prior imaging. MRA HEAD: Multifocal mid to high-grade and, high-grade stenosis involving the anterior and posterior circulation without large vessel occlusion. Findings most consistent with intracranial atherosclerosis.  EEG 4/12: This is a normal awake electroencephalogram. No epileptiform activity is noted.   Carotid dopplers: Heterogeneous plaque bilaterally.Bilateral ICA velocities within 1-39% range of stenosis. Vertebral arteries are patent and antegrade bilaterally. Incidental finding of increased velocities of the right ECA suggestive of >50% ECA stenosis.  Echo: Mild concentric hypertrophy of LV. EF 55-60%. No RMWA.   LE doppler revealed a partially occluding acute DVT in the right mid femoral vein extending into the right calf veins  Bubble study: +PFO. Positive Transcranial Doppler Bubble Study indicative of a small right to left intracardiac or pulmonary shunt with spencer degree II-III Results/Tests Pending at Time of Discharge: None  Discharge Medications:    Medication List    STOP taking these medications        aspirin EC 81 MG tablet     cephALEXin 500 MG capsule  Commonly known as:  KEFLEX     conjugated estrogens vaginal  cream  Commonly known as:  PREMARIN      TAKE these medications        acetaminophen 500 MG tablet  Commonly known as:  TYLENOL  Take 1 tablet (500 mg total) by mouth every 6 (six) hours as needed.     ALIGN 4 MG Caps  Take 4 mg by mouth daily.     aluminum hydroxide-magnesium carbonate 95-358 MG/15ML Susp  Commonly known as:  GAVISCON  Take 30 mLs by mouth as needed for indigestion or heartburn.     amLODipine 10 MG tablet  Commonly known as:  NORVASC  TAKE 1 TABLET (10 MG TOTAL) BY MOUTH DAILY.     amoxicillin 500 MG capsule  Commonly known as:  AMOXIL  Take 1 capsule (500 mg total) by mouth 3 (three) times daily.     apixaban 5 MG Tabs tablet  Commonly known  as:  ELIQUIS  Take 2 tablets (10 mg total) by mouth 2 (two) times daily. Until 12/29/14 in the PM, then decrease to 5mg  (1 tablet) twice daily.     atorvastatin 40 MG tablet  Commonly known as:  LIPITOR  Take 1 tablet (40 mg total) by mouth daily at 6 PM.     BENEFIBER Powd  Take 1 Dose by mouth daily.     cholecalciferol 1000 UNITS tablet  Commonly known as:  VITAMIN D  Take 1,000 Units by mouth daily.     CRANBERRY PO  Take 240 mLs by mouth 3 (three) times daily. Cranberry concentrate solution     D-MANNOSE PO  Take 1 tablet by mouth 2 (two) times daily.     ergocalciferol 50000 UNITS capsule  Commonly known as:  VITAMIN D2  Take 50,000 Units by mouth once a week.     fluticasone 50 MCG/ACT nasal spray  Commonly known as:  FLONASE  Place 2 sprays into both nostrils daily.     gabapentin 100 MG capsule  Commonly known as:  NEURONTIN  Take 1 capsule by mouth 3 (three) times daily.     omeprazole 20 MG capsule  Commonly known as:  PRILOSEC  Take 1 capsule (20 mg total) by mouth daily.     vitamin B-12 1000 MCG tablet  Commonly known as:  CYANOCOBALAMIN  Take 1,000 mcg by mouth daily.        Discharge Instructions: Please refer to Patient Instructions section of EMR for full details.  Patient was counseled important signs and symptoms that should prompt return to medical care, changes in medications, dietary instructions, activity restrictions, and follow up appointments.   Follow-Up Appointments: Follow-up Information    Follow up with Uvaldo RisingFLETKE, KYLE, J, MD On 12/27/2014.   Specialty:  Family Medicine   Why:  at 10am for a hospital follow up   Contact information:   [address] [place] [address] [phone]       Follow up with [facility].   Why:  they will do your home health care at your home   Contact information:   [address] [place] [address] [phone]       Follow up with Xu,Jindong, MD. Schedule an  appointment as soon as possible for a visit in 2 months.   Specialty:  Neurology   Why:  stroke clinic   Contact information:   [address] [place] [address] [phone]       Joanna Puffrystal S Dorsey, MD 12/23/2014, 11:06 PM PGY-1,  [place]

## 2014-12-21 NOTE — Progress Notes (Signed)
Family Medicine Teaching Service Daily Progress Note Intern Pager: [phone]  [name] Medical record number: [id number] Date of birth: 01-23-1928 Age: 81 y.o. Gender: female  Primary Care Provider: Uvaldo Rising, MD Consultants: Neurology  Code Status: FULL per discussion with family on admission   Pt Overview and Major Events to Date:  4/11: Left sided weakness and poorly responsive   Assessment and Plan: Victoria Lewis is a 81 y.o. female presenting with focal neurological deficits concerning for CVA vs TIA. PMH is significant for recurrent UTIs, dementia, syncope with complete AV block s/p loop recorder implantation, HTN, stage III CKD, and hyperlipidemia.   Acute focal neurological deficits: Concerning for right middle cerebral artery territory ischemia given symptoms and MRI findings.  However, AMS may also be due to poor reserve and h/o behavior like this with UTIs in the past.  No new findings on loop recorder.  - Neurology following, appreciate recs - Echo and carotid dopplers ordered; will help assist in determining the utility of statin therapy - If embolic infarct, would discuss the possibility of anticoagulation with PCP given the patient's age and current state of health - ASA currently > given stroke on ASA will switch to Plavix  daily for secondary prevention - Atorvastatin  for now (LDL 211) - Neuro checks frequently  - f/u Hb A1c  - PT/OT/SLP - Dysphagia 3 diet per SLP  Recurrent UTIs: Has a history of VRE, was started with estrogen cream in an attempt to prevent UTIs. Treated with Keflex as outpatient with urinalysis evidence of continued colonization/infection (nitrite+, mod. leuks, many bacteria), though no systemic signs (no fevers, leukocytosis, or reported symptoms however family states her urine "smells stronger" which is normally indicative of infection). Culture from 11/01/2014 showed multiple drug resistant E. coli. (sensitive to  cephalosporins, imipenem, gent and tobra) treated with linezolid IV then PO x 14 days. Last urine cx in our system 2wks ago revealed 95,000 colonies of multiple bacterial morphotypes.  - Will continue ceftriaxone for now: current urine culture >100,000 colonies of gram negative rods - Follow urine culture - Repeat urine culture and obtain blood cultures in the case of fever  CKD Stage III: Creatinine at baseline (~1.0) - Continue to monitor.   HTN: 165-122/50-60s O/N - permissive hypertension for now, then will start home medications tomorrow  - Restart amlodipine as above - Hydralazine  IV prn SBP >  220 or DBP > 120.   GERD:  - Pantoprazole  PO daily   History of complete AV block: ECG in ED with a HR 82 with significant artifact, though this is suspected to have subsided. Loop recorder interrogated without new findings  - Continue to monitor on telemetry (loop recorder also in place)  History of constipation: - Miralax, sennakot prn  FEN/GI: Dysphagia  3 diet , MIVF > will discontinue once better PO intake  Prophylaxis: Subcutaneous heparin  Disposition: Pending CVA work up, D/C home with HHPT, will need 24hr supervision per OT (per family wishes)  Subjective:  Per patient's daughter, she is doing better from yesterday. Speech has improved (she's even been singing), however she notes that she continues to have some mumbled speech towards the end of her sentences which is unusual for her. Her daughter also believes the estrogen cream has been helpful, however she wonders if it could be causing an infection as they only have 1 applicator per tube so she worried about sanitation.  Objective: Temp:  [98.4 F (36.9 C)-99  F (37.2 C)] 98.4 F (36.9 C) (04/13 0100) Pulse Rate:  [88-116] 91 (04/13 0100) Resp:  [16-20] 16 (04/13 0100) BP: (122-165)/(52-68) 165/68 mmHg (04/13 0100) SpO2:  [95 %-97 %] 96 % (04/13 0100) Physical Exam: General: Pleasant elderly female in no  distress awake sitting up in bed eating breakfast HEENT: Atraumatic, conjunctivae non-injected, MMM, oropharynx clear Cardiovascular: Regular rate, no murmur, rubs or gallops. 1+ DP pulses bilaterally.  Respiratory: Nonlabored, CTAB. Satting well on RA.  Abdomen: + BS, soft, ND/NT.  Extremities: Warm and well perfused without edema or gross deformity.  Skin: No rashes or wounds noted  Neuro: Alert and oriented to person and place. PERRL. EOMI. Speech more clear this AM. Tongue with minimal deviation to the left, however difficult to ascertain as pt cannot follow complex instructions. Subtle diminished L hand grip, otherwise 5/5. Pt able to lift legs off the bed, however difficult to keep them up.   Laboratory:  Recent Labs Lab 12/19/14 1647 12/19/14 1654  WBC 7.2  --   HGB 11.7* 13.3  HCT 36.1 39.0  PLT 345  --     Recent Labs Lab 12/19/14 1647 12/19/14 1654 12/20/14 0731 12/21/14 0453  NA 136 136 133* 137  K 4.4 4.4 3.8 3.4*  CL 101 101 100 104  CO2 21  --  25 24  BUN 11 14 8 7   CREATININE 1.05 0.90 0.90 0.83  CALCIUM 9.5  --  8.7 8.7  PROT 7.1  --   --   --   BILITOT 0.6  --   --   --   ALKPHOS 90  --   --   --   ALT 14  --   --   --   AST 24  --   --   --   GLUCOSE 91 89 84 86   Urinalysis    Component Value Date/Time   COLORURINE YELLOW 12/19/2014 1715   APPEARANCEUR HAZY* 12/19/2014 1715   LABSPEC 1.006 12/19/2014 1715   PHURINE 7.0 12/19/2014 1715   GLUCOSEU NEGATIVE 12/19/2014 1715   HGBUR NEGATIVE 12/19/2014 1715   BILIRUBINUR NEGATIVE 12/19/2014 1715   BILIRUBINUR NEG 11/01/2014 0934   KETONESUR NEGATIVE 12/19/2014 1715   PROTEINUR NEGATIVE 12/19/2014 1715   PROTEINUR NEG 11/01/2014 0934   UROBILINOGEN 0.2 12/19/2014 1715   UROBILINOGEN 0.2 11/01/2014 0934   NITRITE POSITIVE* 12/19/2014 1715   NITRITE POSITIVE 11/01/2014 0934   LEUKOCYTESUR MODERATE* 12/19/2014 1715   7-10 WBC, few squam, many bacteria  Urine culture: >100,000 gram negative rods    Imaging/Diagnostic Tests:  CT Head: 1. Chronic microvascular white matter disease. No acute intracranial findings identified.2. Acute on chronic paranasal sinusitis.  MRI/MRA: MRI HEAD: At least 2 subcentimeter foci of acute ischemia in RIGHT middle cerebral artery territory. Faint reduced diffusion in RIGHT posterior frontal lobe could reflect subacute infarct or, Artifact. Moderate to severe global parenchymal brain volume loss with suspected component of normal pressure hydrocephalus. Severe white matter changes most consistent with chronic small vessel ischemic disease. Remote bilateral basal ganglia and RIGHT greater than LEFT small cerebellar infarcts, advanced from prior imaging. MRA HEAD: Multifocal mid to high-grade and, high-grade stenosis involving the anterior and posterior circulation without large vessel occlusion. Findings most consistent with intracranial atherosclerosis.  EEG 4/12: This is a normal awake electroencephalogram. No epileptiform activity is noted.   Joanna Puffrystal S Dorsey, MD 12/21/2014, 6:56 AM PGY-1, [facility] FPTS Intern pager: [phone], text pages welcome

## 2014-12-21 NOTE — Progress Notes (Signed)
VASCULAR LAB PRELIMINARY  PRELIMINARY  PRELIMINARY  PRELIMINARY  BLEV completed.    Preliminary report:  Partially occluding acute DVT right mid femoral vein extending into right calf veins.  Results relayed to patient's nurse, "Drenda FreezeFran".  Carotid Duplex Completed. Heterogeneous plaque bilaterally.  1-39% stenosis bilaterally.  Bilateral vertebral arteries are patent and antegrade.  Loralie ChampagneBishop, Victoria Lewis, RVT 12/21/2014, 5:19 PM

## 2014-12-21 NOTE — Progress Notes (Signed)
  Echocardiogram 2D Echocardiogram has been performed.  Aris EvertsRix, Keeghan Mcintire A 12/21/2014, 3:13 PM

## 2014-12-21 NOTE — Progress Notes (Addendum)
STROKE TEAM PROGRESS NOTE   HISTORY Victoria Lewis is an 81 y.o. female with a past medical history significant for HTN, hyperlipidemia, CKD stage III, dementia, syncope, complete AV block, s/p loop recorder implantation, brought in via EMS as a code stroke due to acute onset of left face weakness, dysarthria, left hand weakness. Patient has advanced dementia and can not contribute to her history, but according to EMS she was last seen normal by her husband around 2 pm 12/19/2014 and when she returned back to the house she was slumped over with left face weakness, left arm weakness, and slurred speech.Family is at the bedside and report that she had had similar symptoms in the context of UTI and syncope. NIHSS 5, but the only new findings are left face weakness and decreased left grip. CT brain was personally reviewed and showed no acute abnormality. Patient was not administered TPA secondary to mild deficits. She was admitted for further evaluation and treatment.   SUBJECTIVE (INTERVAL HISTORY) Her daughter is at the bedside. Daughter feels patient condition improved. No findings of Afib thus far.    OBJECTIVE Temp:  [98.3 F (36.8 C)-98.8 F (37.1 C)] 98.3 F (36.8 C) (04/13 1432) Pulse Rate:  [79-95] 83 (04/13 1432) Cardiac Rhythm:  [-] Normal sinus rhythm (04/13 0936) Resp:  [16-18] 18 (04/13 1432) BP: (134-165)/(52-71) 157/71 mmHg (04/13 1432) SpO2:  [95 %-98 %] 98 % (04/13 1432)  No results for input(s): GLUCAP in the last 168 hours.  Recent Labs Lab 12/19/14 1647 12/19/14 1654 12/20/14 0731 12/21/14 0453  NA 136 136 133* 137  K 4.4 4.4 3.8 3.4*  CL 101 101 100 104  CO2 21  --  25 24  GLUCOSE 91 89 84 86  BUN CREATININE 1.05 0.90 0.90 0.83  CALCIUM 9.5  --  8.7 8.7    Recent Labs Lab 12/19/14 1647  AST 24  ALT 14  ALKPHOS 90  BILITOT 0.6  PROT 7.1  ALBUMIN 4.2    Recent Labs Lab 12/19/14 1647 12/19/14 1654 12/21/14 0453  WBC 7.2  --  6.6   NEUTROABS 4.4  --   --   HGB 11.7* 13.3 10.6*  HCT 36.1 39.0 32.4*  MCV 89.8  --  89.8  PLT 345  --  298   No results for input(s): CKTOTAL, CKMB, CKMBINDEX, TROPONINI in the last 168 hours.  Recent Labs  12/19/14 1647  LABPROT 13.0  INR 0.97    Recent Labs  12/19/14 1715  COLORURINE YELLOW  LABSPEC 1.006  PHURINE 7.0  GLUCOSEU NEGATIVE  HGBUR NEGATIVE  BILIRUBINUR NEGATIVE  KETONESUR NEGATIVE  PROTEINUR NEGATIVE  UROBILINOGEN 0.2  NITRITE POSITIVE*  LEUKOCYTESUR MODERATE*       Component Value Date/Time   CHOL 291* 12/20/2014 0731   TRIG 178* 12/20/2014 0731   HDL 44 12/20/2014 0731   CHOLHDL 6.6 12/20/2014 0731   VLDL 36 12/20/2014 0731   LDLCALC 211* 12/20/2014 0731   Lab Results  Component Value Date   HGBA1C 5.7* 12/19/2014   No results found for: LABOPIA, COCAINSCRNUR, LABBENZ, AMPHETMU, THCU, LABBARB  No results for input(s): ETH in the last 168 hours.  I have personally reviewed the radiological images below and agree with the radiology interpretations.  Ct Head (brain) Wo Contrast 12/19/2014   1. Chronic microvascular white matter disease. No acute intracranial findings identified. 2. Acute on chronic paranasal sinusitis.   MRI HEAD 12/19/2014    At least 2 subcentimeter  foci of rete of acute ischemia in RIGHT middle cerebral artery territory. Faint reduced diffusion in RIGHT posterior frontal lobe could reflect subacute infarct or, artifact.  Moderate to severe global parenchymal brain volume loss with suspected component of normal pressure hydrocephalus.  Severe white matter changes most consistent with chronic small vessel ischemic disease. Remote bilateral basal ganglia and RIGHT greater than LEFT small cerebellar infarcts, advanced from prior imaging.  Acute on chronic moderate paranasal sinusitis.     MRA HEAD 12/19/2014   Multifocal mid to high-grade and, high-grade stenosis involving the anterior and posterior circulation without large vessel  occlusion. Findings most consistent with intracranial atherosclerosis.    EEG - This is a normal awake electroencephalogram. No epileptiform activity is noted.  2D echo - - Left ventricle: The cavity size was normal. There was mild concentric hypertrophy. Systolic function was normal. The estimated ejection fraction was in the range of 55% to 60%. Wall motion was normal; there were no regional wall motion abnormalities. - Mitral valve: Calcified annulus. - Left atrium: The atrium was mildly dilated. - Atrial septum: No defect or patent foramen ovale was identified.  CUS - pending  LE venous doppler - pending  PHYSICAL EXAM  Temp:  [98.3 F (36.8 C)-98.8 F (37.1 C)] 98.3 F (36.8 C) (04/13 1432) Pulse Rate:  [79-95] 83 (04/13 1432) Resp:  [16-18] 18 (04/13 1432) BP: (134-165)/(52-71) 157/71 mmHg (04/13 1432) SpO2:  [95 %-98 %] 98 % (04/13 1432)  General - Well nourished, well developed, in no apparent distress.  Ophthalmologic - not cooperative on exam.  Cardiovascular - Regular rate and rhythm.  Mental Status -  Awake, alert, orientated to self, place and people, but not to time or situation. Limited language outpt, able to name 2/2 and repeat, follows simple commands but not complex commands.  Cranial Nerves II - XII - II - Visual field intact OU. III, IV, VI - Extraocular movements intact. V - Facial sensation intact bilaterally. VII - Facial movement intact bilaterally. VIII - Hearing & vestibular intact bilaterally. X - Palate elevates symmetrically. XI - Chin turning & shoulder shrug intact bilaterally. XII - Tongue protrusion intact.  Motor Strength - The patient's strength was 4/5 upper extremities and pronator drift was absent, however, left hand mildly decreased dexterity, improved from yesterday. LEs 3/5 proximal and 3/5 distally. Bulk was normal and fasciculations were absent.   Motor Tone - Muscle tone was assessed at the neck and appendages and  was normal.  Reflexes - The patient's reflexes were 1+ in all extremities and she had no pathological reflexes.  Sensory - Light touch, temperature/pinprick were assessed and were symmetrical.    Coordination - The patient had normal movements in the hands with no ataxia or dysmetria.  Tremor was absent.  Gait and Station - not tested due to wheelchair bound.   ASSESSMENT/PLAN Ms. Tera HelperBetty C Colley is a 81 y.o. female with history of HTN, hyperlipidemia, CKD stage III, dementia, syncope, complete AV block, s/p loop recorder implantation presenting with left face weakness, dysarthria, left hand weakness. She did not receive IV t-PA due to mild deficits.   Stroke:  Non-dominant right MCA punctate cortical infarcts, felt to be embolic given cortical distribution, secondary to unknown source  Resultant  L hand dexterity mild difficulty  MRI  R MCA cortical punctate infarcts. Chronic b/l BG infarcts.   MRA  Diffuse intracranial atherosclerosis without large vessel occlusion   Carotid Doppler  pending   2D Echo unremarkable  LE venous doppler pending  EEG no seizure  Loop recorder placed early 2014 per record. Cardiology interrogated without new finding, no atrial fibrillation.   LDL 211, not at goal  HgbA1c 5.7  Heparin 5000 units sq tid for VTE prophylaxis DIET DYS 3 Room service appropriate?: Yes with Assist; Fluid consistency:: Thin  aspirin 81 mg orally every day prior to admission, now on clopidogrel 75 mg orally every day. Given stroke on aspirin,changed to plavix 75 mg daily for secondary stroke prevention. If embolic source found, can consider anticoagulation at that time.  Ongoing aggressive stroke risk factor management  Therapy recommendations:  SNF PT/24 hr. Supervision/Assistance.  Disposition: SNF   Hypertension  Home meds:   norvasc Permissive hypertension (OK if <220/120) for 24-48 hours post stroke and then gradually normalized within 5-7  days.  Stable  Hyperlipidemia  Home meds:  No statin  LDL 211, goal < 70  Put on lipitor   Continue statin at discharge  Other Stroke Risk Factors  Advanced age  Family hx stroke (mother)  Other Active Problems  Baseline dementia  Hx recurrent UTIs, positive this admission  CKD stage III  GERD  Hx Complete AV block  constipation  Hospital day # 2  Vanetta Mulders [facility] See Amion for Pager information 12/21/2014 5:15 PM   I, the attending vascular neurologist, have personally obtained a history, examined the patient, evaluated laboratory data, individually viewed imaging studies and agree with radiology interpretations. I also obtained additional history from pt's son at bedside. Together with the NP/PA, we formulated the assessment and plan of care which reflects our mutual decision.  I have made any additions or clarifications directly to the above note and agree with the findings and plan as currently documented.   81 yo F with hx of HTN, HLD, CKD, dementia and AV block s/p loop admitted for right MCA cortical punctate infarcts, embolic pattern but loop recorder interrogation did not show afib (of note, loop only detect afib more than 2 min). Although not able to completely ruled out afib, not felt so far there is indication for anticoagulation yet. LDL very high, put on Lipitor. Will recommend plavix and statin. 2-D Echo unremarkable, waiting for her carotid Doppler and lower extremity venous Doppler.    Marvel Plan, MD PhD Stroke Neurology 12/21/2014 5:15 PM       To contact Stroke Continuity provider, please refer to [email or web address]. After hours, contact General Neurology

## 2014-12-21 NOTE — Progress Notes (Signed)
Speech Language Pathology Treatment: Dysphagia  Patient Details Name: Victoria Lewis MRN: [id number] DOB: 1928-01-14 Today's Date: 12/21/2014 Time: 5784-6962 SLP Time Calculation (min) (ACUTE ONLY): 21 min  Assessment / Plan / Recommendation Clinical Impression  Pt being fed breakfast by daughter from [place] upon SLP entrance to room.  Pt requiring moderate verbal cueing to orally clear boluses before being provided more.  Cough x1 noted with cereal - ? Oral residuals spilling into pharynx/larynx.    Pt does have premorbid cognitive based dysphagia - characterized by oral holding of pills and food at home.  Facial droop appears less defined today than yesterday, pt remains with weak voice however.  Encouraged daughter/pt to strengthen voice as much as able for maximal airway protection.     Educated daughter to aspiration precautions, encouraging pt to self feed as able for neural input and to fluidity of swallowing ability and modification indications. Mitigation strategy education completed and pt appears to be tolerating po diet at this time.    Victoria Lewis Victoria Lewis: 81 yo female adm [facility] with recurrent UTI.  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  Swallow evaluation ordered as pt failed screen.  Son reports pt failed RN screen due to inability to drink from a cup.  MRI showed acute ischemia right MCA region, subacute vs acute right posterior frontal , paranasal sinusitis.     Pertinent Vitals Pain Assessment: No/denies pain  SLP Plan  All goals met    Recommendations Diet recommendations: Dysphagia 3 (mechanical soft);Thin liquid Liquids provided via: Straw Medication Administration: Whole meds with puree Supervision: Full supervision/cueing for compensatory strategies;Trained caregiver to feed patient Compensations: Slow rate;Small sips/bites;Check for pocketing Postural Changes and/or Swallow Maneuvers: Seated  upright 90 degrees;Upright 30-60 min after meal              Oral Care Recommendations: Oral care BID Follow up Recommendations: None Plan: All goals met    [place], [place], [place] [facility] SLP [phone]

## 2014-12-22 ENCOUNTER — Encounter: Payer: Self-pay | Admitting: Internal Medicine

## 2014-12-22 DIAGNOSIS — I82401 Acute embolism and thrombosis of unspecified deep veins of right lower extremity: Secondary | ICD-10-CM

## 2014-12-22 DIAGNOSIS — Q211 Atrial septal defect: Secondary | ICD-10-CM

## 2014-12-22 LAB — PROTIME-INR
INR: 1 (ref 0.00–1.49)
Prothrombin Time: 13.3 seconds (ref 11.6–15.2)

## 2014-12-22 MED ORDER — APIXABAN 5 MG PO TABS
10.0000 mg | ORAL_TABLET | Freq: Two times a day (BID) | ORAL | Status: DC
  Administered 2014-12-22 – 2014-12-23 (×2): 10 mg via ORAL
  Filled 2014-12-22 (×2): qty 2

## 2014-12-22 MED ORDER — AMLODIPINE BESYLATE 10 MG PO TABS
10.0000 mg | ORAL_TABLET | Freq: Every day | ORAL | Status: DC
  Administered 2014-12-22 – 2014-12-23 (×2): 10 mg via ORAL
  Filled 2014-12-22 (×2): qty 1

## 2014-12-22 MED ORDER — APIXABAN (ELIQUIS) EDUCATION KIT FOR DVT/PE PATIENTS
PACK | Freq: Once | Status: AC
  Administered 2014-12-22: 14:00:00
  Filled 2014-12-22: qty 1

## 2014-12-22 MED ORDER — APIXABAN 5 MG PO TABS: 5.0000 mg | ORAL_TABLET | Freq: Two times a day (BID) | ORAL | Status: DC

## 2014-12-22 NOTE — Progress Notes (Signed)
BENEFIT CHECK FOR ELIQUIS IN PROGRESS ( to check the co pay and to see if the medication need authorization by the MD). Abelino DerrickB Devlin Brink RN,BSN,MHA [phone]

## 2014-12-22 NOTE — Discharge Instructions (Addendum)
[name] came in with weakness and was found to have a stroke  This was most likely due a blood clot in her leg. We started her on Eliquis (she will take 10mg  twice daily for 2 weeks, then go down to 5mg  twice daily to complete a 6 month course). She was also found to have a urinary tract infection with 2 different bacteria. She will need IM injections  for the next 5 days to treat against 1 infection (an antibiotic called Ceftriaxone) and a  10day course of oral antibiotics to treat the other infection (called amoxicillin).  Home health will come to your home and help with her IM antibiotic injection  Information on my medicine - ELIQUIS (apixaban)  This medication education was reviewed with me or my healthcare representative as part of my discharge preparation.  The pharmacist that spoke with me during my hospital stay was:  Herby AbrahamBell, Michelle T, [facility]  Why was Eliquis prescribed for you? Eliquis was prescribed to treat blood clots that may have been found in the veins of your legs (deep vein thrombosis) or in your lungs (pulmonary embolism) and to reduce the risk of them occurring again.  What do You need to know about Eliquis ? The starting dose is 10 mg (two 5 mg tablets) taken TWICE daily for the FIRST SEVEN (7) DAYS, then on 4/21 PM  the dose is reduced to ONE 5 mg tablet taken TWICE daily.  Eliquis may be taken with or without food.   Try to take the dose about the same time in the morning and in the evening. If you have difficulty swallowing the tablet whole please discuss with your pharmacist how to take the medication safely.  Take Eliquis exactly as prescribed and DO NOT stop taking Eliquis without talking to the doctor who prescribed the medication.  Stopping may increase your risk of developing a new blood clot.  Refill your prescription before you run out.  After discharge, you should have regular check-up appointments with your healthcare provider that is prescribing your  Eliquis.    What do you do if you miss a dose? If a dose of ELIQUIS is not taken at the scheduled time, take it as soon as possible on the same day and twice-daily administration should be resumed. The dose should not be doubled to make up for a missed dose.  Important Safety Information A possible side effect of Eliquis is bleeding. You should call your healthcare provider right away if you experience any of the following: ? Bleeding from an injury or your nose that does not stop. ? Unusual colored urine (red or dark brown) or unusual colored stools (red or black). ? Unusual bruising for unknown reasons. ? A serious fall or if you hit your head (even if there is no bleeding).  Some medicines may interact with Eliquis and might increase your risk of bleeding or clotting while on Eliquis. To help avoid this, consult your healthcare provider or pharmacist prior to using any new prescription or non-prescription medications, including herbals, vitamins, non-steroidal anti-inflammatory drugs (NSAIDs) and supplements.  This website has more information on Eliquis (apixaban): http://www.eliquis.com/eliquis/home Information on my medicine - ELIQUIS (apixaban)  This medication education was reviewed with me or my healthcare representative as part of my discharge preparation.  The pharmacist that spoke with me during my hospital stay was:  Herby AbrahamBell, Michelle T, [facility]RPH  Why was Eliquis prescribed for you? Eliquis was prescribed to treat blood clots that may have been  found in the veins of your legs (deep vein thrombosis) or in your lungs (pulmonary embolism) and to reduce the risk of them occurring again.  What do You need to know about Eliquis ? The starting dose is 10 mg (two 5 mg tablets) taken TWICE daily for the FIRST SEVEN (7) DAYS, then on 4/21 PM  the dose is reduced to ONE 5 mg tablet taken TWICE daily.  Eliquis may be taken with or without food.   Try to take the dose about the same time  in the morning and in the evening. If you have difficulty swallowing the tablet whole please discuss with your pharmacist how to take the medication safely.  Take Eliquis exactly as prescribed and DO NOT stop taking Eliquis without talking to the doctor who prescribed the medication.  Stopping may increase your risk of developing a new blood clot.  Refill your prescription before you run out.  After discharge, you should have regular check-up appointments with your healthcare provider that is prescribing your Eliquis.    What do you do if you miss a dose? If a dose of ELIQUIS is not taken at the scheduled time, take it as soon as possible on the same day and twice-daily administration should be resumed. The dose should not be doubled to make up for a missed dose.  Important Safety Information A possible side effect of Eliquis is bleeding. You should call your healthcare provider right away if you experience any of the following: ? Bleeding from an injury or your nose that does not stop. ? Unusual colored urine (red or dark brown) or unusual colored stools (red or black). ? Unusual bruising for unknown reasons. ? A serious fall or if you hit your head (even if there is no bleeding).  Some medicines may interact with Eliquis and might increase your risk of bleeding or clotting while on Eliquis. To help avoid this, consult your healthcare provider or pharmacist prior to using any new prescription or non-prescription medications, including herbals, vitamins, non-steroidal anti-inflammatory drugs (NSAIDs) and supplements.  This website has more information on Eliquis (apixaban): http://www.eliquis.com/eliquis/home

## 2014-12-22 NOTE — Procedures (Signed)
[facility]  [address]  [place]. [place] [id number].  [phone]   TRANSCRANIAL DOPPLER BUBBLE STUDY  Victoria Lewis  Date of Birth: 05/31/1928 Medical Record Number: [id number] Indications: embolic stroke in the setting of positive right LE DVT Date of Procedure: 12/22/14 Clinical History: 81 yo F presented with embolic stroke in the setting of positive right LE DVT Technical Description: Transcranial Doppler Bubble Study was performed at the bedside after taking written informed consent from the patient and explaining risk/benefits. Acoustic window difficult to get from both sides. Eventually the right middle cerebral artery was insonated using a hand held probe from left side. And IV line had been previously inserted in the right forearm by the RN using aseptic precautions. Agitated saline injection at rest and after valsalva maneuver did result in ~30 high intensity transient signals (HITS).  Impression: Positive Transcranial Doppler Bubble Study indicative of a small right to left intracardiac or pulmonary shunt with spencer degree II-III  Results were explained to the patient. Questions were answered. I had a long discussion with the patient with regards to the role of patent foramen ovale and risk of stroke. It is unclear at the present time whether PFO closure leads to better secondary stroke prevention or not. There are ongoing clinical trials which are trying to address this issue. Since pt is positive for DVT, then anticoagulation needs to be considered.  Victoria PlanJindong Mone Commisso, MD PhD Stroke Neurology 12/22/2014 3:50 PM

## 2014-12-22 NOTE — Progress Notes (Signed)
PT Cancellation Note  Patient Details Name: Victoria Lewis MRN: [id number] DOB: 10-17-1927   Cancelled Treatment:    Reason Eval/Treat Not Completed: Other (comment);Medical issues which prohibited therapy Received discontinue order for PT. Pt with acute DVT diagnosed last night, currently on Heparin injections. Awaiting updated orders/recommendations from MD. Will follow up as appropriate.  Alvie HeidelbergFolan, Jaynell Castagnola A 12/22/2014, 11:40 AM  Alvie HeidelbergShauna Folan, PT, DPT [phone]

## 2014-12-22 NOTE — Progress Notes (Signed)
ANTICOAGULATION CONSULT NOTE - Initial Consult  Pharmacy Consult for Apixaban Indication: New RLE DVT  No Known Allergies  Patient Measurements: Height: 5\' 4"  (162.6 cm) Weight: 137 lb 5.6 oz (62.3 kg) IBW/kg (Calculated) : 54.7  Vital Signs: Temp: 98.5 F (36.9 C) (04/14 1041) Temp Source: Oral (04/14 1041) BP: 146/57 mmHg (04/14 1041) Pulse Rate: 71 (04/14 1041)  Labs:  Recent Labs  12/19/14 1647 12/19/14 1654 12/20/14 0731 12/21/14 0453  HGB 11.7* 13.3  --  10.6*  HCT 36.1 39.0  --  32.4*  PLT 345  --   --  298  APTT 27  --   --   --   LABPROT 13.0  --   --   --   INR 0.97  --   --   --   CREATININE 1.05 0.90 0.90 0.83    Estimated Creatinine Clearance: 42.8 mL/min (by C-G formula based on Cr of 0.83).   Medical History: Past Medical History  Diagnosis Date  . Hypertension   . GERD (gastroesophageal reflux disease)   . H/O hiatal hernia   . Arthritis   . Anemia     iron  . Syncope 07/13/2014  . UTI (lower urinary tract infection)   . Chronic kidney disease (CKD), stage III (moderate)     [name]/notes 08/30/2014  . Bladder infection, chronic     [name]/notes 08/30/2014  . Dementia     [name]/notes 08/30/2014  . AV block, complete   . Chest pain   . Decreased dorsalis pedis pulse   . Hammer toe   . Hyperlipidemia   . Anemia   . Onychogryposis of toenail   . Osteoporosis   . Scoliosis   . Status post placement of implantable loop recorder   . Syncope   . Uterine disorder   . Diaphragmatic hernia   . Hx of epistaxis   . Peptic ulcer   . Chest pain     Assessment: [age] YOF who presented on 4/12 with L-sided weakness and slurred speech and was found to have a new acute CVA. Dopplers this morning also revealed a RLE DVT. Pharmacy was consulted to start apixaban for anticoagulation. Hgb/Hct slight drop today - plts wnl.   The patient received a dose of Heparin SQ for VTE prophylaxis at 0600 this AM. Will plan to start apixaban this evening. LFTs wnl on 4/11.    Goal of Therapy:  Appropriate anticoagulation   Plan:  1. Discontinue Heparin SQ 2. Start apixaban 10 mg twice daily for 1 week (thru 12/28/14) 3. On 12/29/14 transition to taking apixaban 5 mg twice daily 4. Will continue to monitor for any signs/symptoms of bleeding 5. Will plan to educate prior to discharge  Georgina PillionElizabeth Marquell Saenz, PharmD, BCPS Clinical Pharmacist Pager: [phone] 12/22/2014 12:54 PM

## 2014-12-22 NOTE — Progress Notes (Signed)
Talked to patient with daughter present about HHC choices, pt chose [facility] because she has used them in the past. Miranda with [facility] called for arrangements; Per daughter, no DME neededAlexis Goodell; B Balbina Depace RN,BSN,MHA [phone]

## 2014-12-22 NOTE — Progress Notes (Signed)
STROKE TEAM PROGRESS NOTE   HISTORY Victoria Lewis is an 81 y.o. female with a past medical history significant for HTN, hyperlipidemia, CKD stage III, dementia, syncope, complete AV block, s/p loop recorder implantation, brought in via EMS as a code stroke due to acute onset of left face weakness, dysarthria, left hand weakness. Patient has advanced dementia and can not contribute to her history, but according to EMS she was last seen normal by her husband around 2 pm 12/19/2014 and when she returned back to the house she was slumped over with left face weakness, left arm weakness, and slurred speech.Family is at the bedside and report that she had had similar symptoms in the context of UTI and syncope. NIHSS 5, but the only new findings are left face weakness and decreased left grip. CT brain was personally reviewed and showed no acute abnormality. Patient was not administered TPA secondary to mild deficits. She was admitted for further evaluation and treatment.   SUBJECTIVE (INTERVAL HISTORY) Family at bedside. Pt was found to have right LE DVT. She will need TCD bubble study to confirm or ruled out PFO. Anticoagulation will be started.   OBJECTIVE Temp:  [98 F (36.7 C)-99 F (37.2 C)] 98.7 F (37.1 C) (04/14 1321) Pulse Rate:  [70-87] 87 (04/14 1321) Cardiac Rhythm:  [-] Normal sinus rhythm (04/14 0851) Resp:  [16-20] 18 (04/14 1321) BP: (127-159)/(54-59) 136/58 mmHg (04/14 1321) SpO2:  [95 %-100 %] 99 % (04/14 1321)  No results for input(s): GLUCAP in the last 168 hours.  Recent Labs Lab 12/19/14 1647 12/19/14 1654 12/20/14 0731 12/21/14 0453  NA 136 136 133* 137  K 4.4 4.4 3.8 3.4*  CL 101 101 100 104  CO2 21  --  25 24  GLUCOSE 91 89 84 86  BUN 11 14 8 7   CREATININE 1.05 0.90 0.90 0.83  CALCIUM 9.5  --  8.7 8.7    Recent Labs Lab 12/19/14 1647  AST 24  ALT 14  ALKPHOS 90  BILITOT 0.6  PROT 7.1  ALBUMIN 4.2    Recent Labs Lab 12/19/14 1647 12/19/14 1654  12/21/14 0453  WBC 7.2  --  6.6  NEUTROABS 4.4  --   --   HGB 11.7* 13.3 10.6*  HCT 36.1 39.0 32.4*  MCV 89.8  --  89.8  PLT 345  --  298   No results for input(s): CKTOTAL, CKMB, CKMBINDEX, TROPONINI in the last 168 hours.  Recent Labs  12/19/14 1647  LABPROT 13.0  INR 0.97    Recent Labs  12/19/14 1715  COLORURINE YELLOW  LABSPEC 1.006  PHURINE 7.0  GLUCOSEU NEGATIVE  HGBUR NEGATIVE  BILIRUBINUR NEGATIVE  KETONESUR NEGATIVE  PROTEINUR NEGATIVE  UROBILINOGEN 0.2  NITRITE POSITIVE*  LEUKOCYTESUR MODERATE*       Component Value Date/Time   CHOL 291* 12/20/2014 0731   TRIG 178* 12/20/2014 0731   HDL 44 12/20/2014 0731   CHOLHDL 6.6 12/20/2014 0731   VLDL 36 12/20/2014 0731   LDLCALC 211* 12/20/2014 0731   Lab Results  Component Value Date   HGBA1C 5.7* 12/19/2014   No results found for: LABOPIA, COCAINSCRNUR, LABBENZ, AMPHETMU, THCU, LABBARB  No results for input(s): ETH in the last 168 hours.  I have personally reviewed the radiological images below and agree with the radiology interpretations.  Ct Head (brain) Wo Contrast 12/19/2014   1. Chronic microvascular white matter disease. No acute intracranial findings identified. 2. Acute on chronic paranasal sinusitis.   MRI  HEAD 12/19/2014    At least 2 subcentimeter foci of rete of acute ischemia in RIGHT middle cerebral artery territory. Faint reduced diffusion in RIGHT posterior frontal lobe could reflect subacute infarct or, artifact.  Moderate to severe global parenchymal brain volume loss with suspected component of normal pressure hydrocephalus.  Severe white matter changes most consistent with chronic small vessel ischemic disease. Remote bilateral basal ganglia and RIGHT greater than LEFT small cerebellar infarcts, advanced from prior imaging.  Acute on chronic moderate paranasal sinusitis.     MRA HEAD 12/19/2014   Multifocal mid to high-grade and, high-grade stenosis involving the anterior and posterior  circulation without large vessel occlusion. Findings most consistent with intracranial atherosclerosis.    EEG - This is a normal awake electroencephalogram. No epileptiform activity is noted.  2D echo - - Left ventricle: The cavity size was normal. There was mild concentric hypertrophy. Systolic function was normal. Theestimated ejection fraction was in the range of 55% to 60%. Wallmotion was normal; there were no regional wall motionabnormalities. - Mitral valve: Calcified annulus. - Left atrium: The atrium was mildly dilated. - Atrial septum: No defect or patent foramen ovale was identified.  Carotid Doppler  Heterogeneous plaque bilaterally. 1-39% stenosis bilaterally. Bilateral vertebral arteries are patent and antegrade.  LE venous doppler Partially occluding acute DVT right mid femoral vein extending into right calf veins.  TCD bubble study -  Positive for right to left shunt, indicating small PFO.  PHYSICAL EXAM  Temp:  [98 F (36.7 C)-99 F (37.2 C)] 98.7 F (37.1 C) (04/14 1321) Pulse Rate:  [70-87] 87 (04/14 1321) Resp:  [16-20] 18 (04/14 1321) BP: (127-159)/(54-59) 136/58 mmHg (04/14 1321) SpO2:  [95 %-100 %] 99 % (04/14 1321)  General - Well nourished, well developed, in no apparent distress.  Ophthalmologic - not cooperative on exam.  Cardiovascular - Regular rate and rhythm.  Mental Status -  Awake, alert, orientated to self, place and people, but not to time or situation. Limited language outpt, able to name 2/2 and repeat, follows simple commands but not complex commands.  Cranial Nerves II - XII - II - Visual field intact OU. III, IV, VI - Extraocular movements intact. V - Facial sensation intact bilaterally. VII - Facial movement intact bilaterally. VIII - Hearing & vestibular intact bilaterally. X - Palate elevates symmetrically. XI - Chin turning & shoulder shrug intact bilaterally. XII - Tongue protrusion intact.  Motor Strength - The  patient's strength was 4/5 upper extremities and pronator drift was absent, however, left hand mildly decreased dexterity, improved. LEs 3/5 proximal and 3/5 distally. Bulk was normal and fasciculations were absent.   Motor Tone - Muscle tone was assessed at the neck and appendages and was normal.  Reflexes - The patient's reflexes were 1+ in all extremities and she had no pathological reflexes.  Sensory - Light touch, temperature/pinprick were assessed and were symmetrical.    Coordination - The patient had normal movements in the hands with no ataxia or dysmetria.  Tremor was absent.  Gait and Station - not tested due to wheelchair bound.   ASSESSMENT/PLAN Victoria Lewis is a 81 y.o. female with history of HTN, hyperlipidemia, CKD stage III, dementia, syncope, complete AV block, s/p loop recorder implantation presenting with left face weakness, dysarthria, left hand weakness. She did not receive IV t-PA due to mild deficits.   Stroke:  Non-dominant right MCA punctate cortical infarcts, felt to be embolic given cortical distribution, secondary to LE DVT in  the setting of PFO.  Resultant  L hand dexterity mild difficulty  MRI  R MCA cortical punctate infarcts. Chronic b/l BG infarcts.   MRA  Diffuse intracranial atherosclerosis without large vessel occlusion   Carotid Doppler  No significant stenosis   2D Echo unremarkable   LE venous doppler Partially occluding acute DVT right mid femoral vein extending into right calf veins.  EEG no seizure  Loop recorder placed early 2014 per record. Cardiology interrogated without new finding, no atrial fibrillation.   TCD with bubble confirmed small PFO, spencer degree II-III  LDL 211, not at goal  HgbA1c 5.7  Heparin 5000 units sq tid for VTE prophylaxis DIET DYS 3 Room service appropriate?: Yes with Assist; Fluid consistency:: Thin  aspirin 81 mg orally every day prior to admission, initially changed to clopidogrel 75 mg orally  every day. Given newly found DVT in R leg, recommend anticoagualtion. Dr. Roda Shutters discussed with resident.   Ongoing aggressive stroke risk factor management  Therapy recommendations:  SNF PT/24 hr. Supervision/Assistance.  Disposition: SNF   DVT  Right LE DVT found on venous doppler  Risk factor including pt not ambulating  Recommend treatment with anticoagulation  PFO  Confirmed on the TCD bubble study  Stroke likely due to DVT in the setting of positive PFO  No intervention indicated.  Hypertension  Home meds:   norvasc Permissive hypertension (OK if <220/120) for 24-48 hours post stroke and then gradually normalized within 5-7 days.  Stable  Hyperlipidemia  Home meds:  No statin  LDL 211, goal < 70  Put on lipitor   Continue statin at discharge  Other Stroke Risk Factors  Advanced age  Family hx stroke (mother)  Other Active Problems  Baseline dementia  Hx recurrent UTIs, positive this admission  CKD stage III  GERD  Hx Complete AV block  constipation  Hospital day # 3   [facility] See Amion for Pager information 12/22/2014 3:31 PM   I, the attending vascular neurologist, have personally obtained a history, examined the patient, evaluated laboratory data, individually viewed imaging studies and agree with radiology interpretations. I also obtained additional history from pt's daughter at bedside. I also discussed with family medicine service regarding her care plan. Together with the NP/PA, we formulated the assessment and plan of care which reflects our mutual decision.  I have made any additions or clarifications directly to the above note and agree with the findings and plan as currently documented.   81 yo F with hx of HTN, HLD, CKD, dementia and AV block s/p loop admitted for right MCA cortical punctate infarcts, embolic pattern but loop recorder interrogation did not show afib (of note, loop only detect afib  more than 2 min). However, she was found to have right LE DVT. TCD bubble study also showed small PFO, spencer degree II-III. Therefore, her stroke likely due to paradoxical emboli. LDL very high, put on Lipitor. Will recommend plavix and statin. 2-D Echo and CUS unremarkable.pt will be starting on anticoagulation for DVT treatment.  Neurology will sign off. Please call with questions. Pt will follow up with Dr. Roda Shutters at [facility] in about 2 months. Thanks for the consult.  Marvel Plan, MD PhD Stroke Neurology 12/22/2014 3:58 PM        To contact Stroke Continuity provider, please refer to [email or web address]. After hours, contact General Neurology

## 2014-12-22 NOTE — Progress Notes (Signed)
OT Cancellation Note  Patient Details Name: Tera HelperBetty C Moise MRN: [id number] DOB: 09-09-1928   Cancelled Treatment:    Reason Eval/Treat Not Completed: Medical issues which prohibited therapy. Pt with acute DVT diagnosed last night, currently on Heparin injections. No INR value on file. Awaiting clarification on appropriate level of activity.  Will follow up as appropriate.  Pilar GrammesMathews, Aslan Himes H 12/22/2014, 12:41 PM

## 2014-12-22 NOTE — Progress Notes (Signed)
Family Medicine Teaching Service Daily Progress Note Intern Pager: [phone]  [name] Medical record number: [id number] Date of birth: 05/23/1928 Age: 81 y.o. Gender: female  Primary Care Provider: Uvaldo RisingFLETKE, KYLE, J, MD Consultants: Neurology  Code Status: FULL per discussion with family on admission   Assessment and Plan: 81 y.o. female presenting with focal neurological deficits concerning for CVA vs TIA. PMH is significant for recurrent UTIs, dementia, syncope with complete AV block s/p loop recorder implantation, HTN, stage III CKD, and hyperlipidemia.   # Acute focal neurological deficits: Concerning for right middle cerebral artery territory ischemia given symptoms and MRI findings.  However, AMS may also be due to poor reserve and h/o behavior like this with UTIs in the past.  No new findings on loop recorder.  - Neurology following, appreciate recs  - Bubble Study  - Recommend discontinuation of Plavix and anticoagulation, preferably with Eliquis but Xarelto also an option - Echo and carotid dopplers ordered to assist in determining the utility of statin therapy. Partially occluding acute DVT right mid femoral vein into right calf veins. Bilateral vertebral arteries patent and antegrade - ASA > Plavix 75mg  daily for secondary prevention - Atorvastatin 40mg  for now (LDL 211) - PT/OT/SLP- Home with PT - Dysphagia 3 diet per SLP - Case Management for help affording Eliquis as outpatient  # Recurrent UTIs: Has a history of VRE, was started with estrogen cream in an attempt to prevent UTIs. Treated with Keflex as outpatient. Culture from 11/01/2014 showed multiple drug resistant E. coli. (sensitive to cephalosporins, imipenem, gent and tobra) treated with linezolid IV then PO x 14 days. Last urine cx in our system 2wks ago revealed 95,000 colonies of multiple bacterial morphotypes.  - Urine Culture: :  >100,000 colonies of gram negative rods  - Follow up sensitivities.  E. Coli resistant to Ciprofloxacin, Levofloxacin, Bactrim, Ampicillin  - Contacted Microbiology. Culture grew E. coli and Enterococcus. E. Coli sensitivities available and Enterococcus sensitivities should be available tonight - Ceftriaxone (4/12>>)- transition to oral abx pending sensitivities  # HTN: 145-159/54-58 O/N - Restart home Amlodipine 10mg  - Hydralazine 2mg  IV prn SBP >  220 or DBP > 120.   # History of complete AV block: ECG in ED with a HR 82 with significant artifact, though this is suspected to have subsided. Loop recorder interrogated without new findings  - Continue to monitor on telemetry (loop recorder also in place)  FEN/GI: Dysphagia  3 diet , MIVF > will discontinue once better PO intake  Prophylaxis: Subcutaneous heparin  Disposition: Pending CVA work up, D/C home with HHPT, will need 24hr supervision per OT (per family wishes)  Subjective:  Feeling much better today. Per family, she seems 1000% better. Conversing normally. Eating well. Very appreciative of her PCP, Dr. Randolm IdolFletke, for following her care during this hospitalization and making recommendations to inpatient team.  Objective: Temp:  [98 F (36.7 C)-99 F (37.2 C)] 98.4 F (36.9 C) (04/14 0600) Pulse Rate:  [70-87] 80 (04/14 0600) Resp:  [16-20] 20 (04/14 0600) BP: (127-159)/(54-71) 145/54 mmHg (04/14 0600) SpO2:  [95 %-98 %] 96 % (04/14 0600) Physical Exam: General: 81yo female resting comfortably in no apparent distress Cardiovascular: Regular rate, no murmur, rubs or gallops. 1+ DP pulses bilaterally.  Respiratory: Nonlabored, CTAB.   Abdomen: + BS, soft, ND/NT.  Extremities: Warm and well perfused without edema or gross deformity.  Skin: No rashes or wounds noted  Neuro: Alert and oriented to person and place. PERRL.  EOMI. Speech more clear this AM. Muscle strength 5/5 bilaterally in upper and lower extremities. Mild asymmetry with smile.    Laboratory:  Recent Labs Lab 12/19/14 1647  12/19/14 1654 12/21/14 0453  WBC 7.2  --  6.6  HGB 11.7* 13.3 10.6*  HCT 36.1 39.0 32.4*  PLT 345  --  298    Recent Labs Lab 12/19/14 1647 12/19/14 1654 12/20/14 0731 12/21/14 0453  NA 136 136 133* 137  K 4.4 4.4 3.8 3.4*  CL 101 101 100 104  CO2 21  --  25 24  BUN CREATININE 1.05 0.90 0.90 0.83  CALCIUM 9.5  --  8.7 8.7  PROT 7.1  --   --   --   BILITOT 0.6  --   --   --   ALKPHOS 90  --   --   --   ALT 14  --   --   --   AST 24  --   --   --   GLUCOSE 91 89 84 86   Urinalysis    Component Value Date/Time   COLORURINE YELLOW 12/19/2014 1715   APPEARANCEUR HAZY* 12/19/2014 1715   LABSPEC 1.006 12/19/2014 1715   PHURINE 7.0 12/19/2014 1715   GLUCOSEU NEGATIVE 12/19/2014 1715   HGBUR NEGATIVE 12/19/2014 1715   BILIRUBINUR NEGATIVE 12/19/2014 1715   BILIRUBINUR NEG 11/01/2014 0934   KETONESUR NEGATIVE 12/19/2014 1715   PROTEINUR NEGATIVE 12/19/2014 1715   PROTEINUR NEG 11/01/2014 0934   UROBILINOGEN 0.2 12/19/2014 1715   UROBILINOGEN 0.2 11/01/2014 0934   NITRITE POSITIVE* 12/19/2014 1715   NITRITE POSITIVE 11/01/2014 0934   LEUKOCYTESUR MODERATE* 12/19/2014 1715   7-10 WBC, few squam, many bacteria  Urine culture: >100,000 gram negative rods   Imaging/Diagnostic Tests:  CT Head: 1. Chronic microvascular white matter disease. No acute intracranial findings identified.2. Acute on chronic paranasal sinusitis.  MRI/MRA: MRI HEAD: At least 2 subcentimeter foci of acute ischemia in RIGHT middle cerebral artery territory. Faint reduced diffusion in RIGHT posterior frontal lobe could reflect subacute infarct or, Artifact. Moderate to severe global parenchymal brain volume loss with suspected component of normal pressure hydrocephalus. Severe white matter changes most consistent with chronic small vessel ischemic disease. Remote bilateral basal ganglia and RIGHT greater than LEFT small cerebellar infarcts, advanced from prior imaging. MRA HEAD: Multifocal  mid to high-grade and, high-grade stenosis involving the anterior and posterior circulation without large vessel occlusion. Findings most consistent with intracranial atherosclerosis.  EEG 4/12: This is a normal awake electroencephalogram. No epileptiform activity is noted.   [address] [place], [place] 12/22/2014, 8:39 AM PGY-1, [facility] FPTS Intern pager: [phone], text pages welcome

## 2014-12-23 ENCOUNTER — Encounter: Payer: Self-pay | Admitting: *Deleted

## 2014-12-23 LAB — URINE CULTURE: Colony Count: >=100000

## 2014-12-23 MED ORDER — AMOXICILLIN 500 MG PO CAPS: 500.0000 mg | ORAL_CAPSULE | Freq: Three times a day (TID) | ORAL | Status: DC

## 2014-12-23 MED ORDER — APIXABAN 5 MG PO TABS: 10.0000 mg | ORAL_TABLET | Freq: Two times a day (BID) | ORAL | Status: DC

## 2014-12-23 MED ORDER — ATORVASTATIN CALCIUM 40 MG PO TABS: 40.0000 mg | ORAL_TABLET | Freq: Every day | ORAL | Status: DC

## 2014-12-23 MED ORDER — CEFTRIAXONE SODIUM IN DEXTROSE 20 MG/ML IV SOLN
1.0000 g | INTRAVENOUS | Status: DC
  Administered 2014-12-23: 1 g via INTRAVENOUS
  Filled 2014-12-23: qty 50

## 2014-12-23 NOTE — Progress Notes (Signed)
Dc instructions given to family, explained, transported to lobby per wc in no acute distress. Alert with baseline orientation. Dressed, dc'd to care of son dau ghter in no acute distress.

## 2014-12-23 NOTE — Progress Notes (Signed)
Family Medicine Teaching Service Daily Progress Note Intern Pager: [phone]  [name] Medical record number: [id number] Date of birth: [birth year] Age: 81 y.o. Gender: female  Primary Care Provider: Uvaldo RisingFLETKE, KYLE, J, MD Consultants: Neurology  Code Status: FULL per discussion with family on admission   Assessment and Plan: 81 y.o. female presenting with focal neurological deficits concerning for CVA vs TIA. PMH is significant for recurrent UTIs, dementia, syncope with complete AV block s/p loop recorder implantation, HTN, stage III CKD, and hyperlipidemia.   # Right cerebral artery infarction: Noted on MRI.  Initial AMS may also be due to poor reserve and h/o behavior like this with UTIs in the past.  No new findings on loop recorder. LE doppler revealed a partially occluding acute DVT in the right mid femoral vein extending into the right calf veins and a PFO was noted on bubble study. - Neurology following, appreciate recs - Now currently on Eliquis 10mg  BID x 14d, then 5mg  BID to complete a 6 month course. - Atorvastatin 40mg  for now (LDL 211) - PT/OT/SLP- Home with PT - Dysphagia 3 diet per SLP - Case Management for help affording Eliquis as outpatient - [facility]  # Recurrent UTIs: Has a history of VRE, was started with estrogen cream in an attempt to prevent UTIs. Treated with Keflex as outpatient. Culture from 11/01/2014 showed multiple drug resistant E. coli. (sensitive to cephalosporins, imipenem, gent and tobra) treated with linezolid IV then PO x 14 days. Last urine cx in our system 2wks ago revealed 95,000 colonies of multiple bacterial morphotypes.  - Urine Culture: :  >100,000 colonies of both E. coli and Enterococcus. - Unfortunately, Enterococcus sensitive to ampicillin and vanc, while Ecoli is only sensitive to cefazolin, CTX, gentamycin, Zosyn, and trobramycin  - Ceftriaxone (4/12>>) Day 5 > lab would need to further  speciate Enterococcus prior to testing sensitivity to fosfomycin   # HTN: 144-163/60s O/N - Restart home Amlodipine 10mg  - Hydralazine 2mg  IV prn SBP >  220 or DBP > 120.   # History of complete AV block: ECG in ED with a HR 82 with significant artifact, though this is suspected to have subsided. Loop recorder interrogated without new findings  - Continue to monitor on telemetry (loop recorder also in place)  FEN/GI: Dysphagia  3 diet , KVO  Prophylaxis: Eliquis   Disposition: Pending determination of abx, D/C home with HHPT, will need 24hr supervision per OT (per family wishes)  Subjective:  Patient back to her baseline per her daughter, Harriett Sineancy. Speech clear and she's acting like herself. Would like her to be on the most accurate abx for UTI as she's struggled with UTIs in the past.   Objective: Temp:  [98.3 F (36.8 C)-98.9 F (37.2 C)] 98.3 F (36.8 C) (04/15 0504) Pulse Rate:  [71-87] 80 (04/15 0504) Resp:  [17-20] 17 (04/15 0504) BP: (125-163)/(57-65) 163/65 mmHg (04/15 0504) SpO2:  [97 %-100 %] 100 % (04/15 0504) Physical Exam: General: 81yo female lying in bed comfortably in no apparent distress Cardiovascular: Regular rate, no murmur, rubs or gallops. 1+ DP pulses bilaterally.  Respiratory: Nonlabored, CTAB.  No wheezing, rhonchi, or crackles noted. Abdomen: + BS, soft, ND/NT.  Extremities: Warm and well perfused without edema or gross deformity.  Neuro: Alert and oriented to person and place.  Smiles throughout the exam. Smile symmetric. Tongue and uvula midline. PERRL. EOMI. Speech clear. Muscle strength 5/5 bilaterally in  upper and lower extremities.   Laboratory:  Recent Labs Lab 12/19/14 1647 12/19/14 1654 12/21/14 0453  WBC 7.2  --  6.6  HGB 11.7* 13.3 10.6*  HCT 36.1 39.0 32.4*  PLT 345  --  298    Recent Labs Lab 12/19/14 1647 12/19/14 1654 12/20/14 0731 12/21/14 0453  NA 136 136 133* 137  K 4.4 4.4 3.8 3.4*  CL 101 101 100 104  CO2 21  --  25  24  BUN CREATININE 1.05 0.90 0.90 0.83  CALCIUM 9.5  --  8.7 8.7  PROT 7.1  --   --   --   BILITOT 0.6  --   --   --   ALKPHOS 90  --   --   --   ALT 14  --   --   --   AST 24  --   --   --   GLUCOSE 91 89 84 86   Urinalysis    Component Value Date/Time   COLORURINE YELLOW 12/19/2014 1715   APPEARANCEUR HAZY* 12/19/2014 1715   LABSPEC 1.006 12/19/2014 1715   PHURINE 7.0 12/19/2014 1715   GLUCOSEU NEGATIVE 12/19/2014 1715   HGBUR NEGATIVE 12/19/2014 1715   BILIRUBINUR NEGATIVE 12/19/2014 1715   BILIRUBINUR NEG 11/01/2014 0934   KETONESUR NEGATIVE 12/19/2014 1715   PROTEINUR NEGATIVE 12/19/2014 1715   PROTEINUR NEG 11/01/2014 0934   UROBILINOGEN 0.2 12/19/2014 1715   UROBILINOGEN 0.2 11/01/2014 0934   NITRITE POSITIVE* 12/19/2014 1715   NITRITE POSITIVE 11/01/2014 0934   LEUKOCYTESUR MODERATE* 12/19/2014 1715   7-10 WBC, few squam, many bacteria  Urine culture: >100,000 gram negative rods   Imaging/Diagnostic Tests:  CT Head: 1. Chronic microvascular white matter disease. No acute intracranial findings identified.2. Acute on chronic paranasal sinusitis.  MRI/MRA: MRI HEAD: At least 2 subcentimeter foci of acute ischemia in RIGHT middle cerebral artery territory. Faint reduced diffusion in RIGHT posterior frontal lobe could reflect subacute infarct or, Artifact. Moderate to severe global parenchymal brain volume loss with suspected component of normal pressure hydrocephalus. Severe white matter changes most consistent with chronic small vessel ischemic disease. Remote bilateral basal ganglia and RIGHT greater than LEFT small cerebellar infarcts, advanced from prior imaging. MRA HEAD: Multifocal mid to high-grade and, high-grade stenosis involving the anterior and posterior circulation without large vessel occlusion. Findings most consistent with intracranial atherosclerosis.  EEG 4/12: This is a normal awake electroencephalogram. No epileptiform activity is noted.    Carotid dopplers: Heterogeneous plaque bilaterally.Bilateral ICA velocities within 1-39% range of stenosis. Vertebral arteries are patent and antegrade bilaterally. Incidental finding of increased velocities of the right ECA suggestive of >50% ECA stenosis.  Echo: Mild concentric hypertrophy of LV. EF 55-60%. No RMWA.   LE doppler revealed a partially occluding acute DVT in the right mid femoral vein extending into the right calf veins  Joanna Puff, MD 12/23/2014, 8:16 AM PGY-1, [facility] FPTS Intern pager: [phone], text pages welcome

## 2014-12-23 NOTE — Progress Notes (Signed)
Physical Therapy Treatment Patient Details Name: Victoria Lewis MRN: [id number] DOB: 03/01/1928 Today's Date: 12/23/2014    History of Present Illness Victoria Lewis is an 81 y.o. female with a past medical history significant for HTN, hyperlipidemia, CKD stage III, dementia, syncope, complete AV block, s/p loop recorder implantation, brought in via EMS as a code stroke due to acute onset of left face weakness, dysarthria, left hand weakness.NIHSS 5, but the only new findings are left face weakness and decreased left grip. CT (-). MRI- 2 subcentimeter foci of rete of acute ischemia in the R MCA.     PT Comments    Patient progressing well with mobility. Improved ambulation distance with Max A of 2 and cues for weight shifting and advancement of LEs. Pt demonstrates severe posterior bias through hips requiring manual cues for hip extension/upright posture. Fatigues quickly. Will continue to follow and progress as tolerated. Family willing to take pt home to provide 24/7 supervision/assist.   Follow Up Recommendations  Home health PT;Supervision/Assistance - 24 hour     Equipment Recommendations  None recommended by PT    Recommendations for Other Services       Precautions / Restrictions Precautions Precautions: Fall Restrictions Weight Bearing Restrictions: No    Mobility  Bed Mobility Overal bed mobility: Needs Assistance Bed Mobility: Supine to Sit Rolling: +2 for physical assistance;Total assist   Supine to sit: Max assist;HOB elevated Sit to supine: Total assist   General bed mobility comments: Step by step cues to perform transfer. Assist bringing BLEs to EOB and to elevate trunk. Able to assist scooting bottom to EOB with constant cues.  Transfers Overall transfer level: Needs assistance Equipment used: Rolling walker (2 wheeled) Transfers: Sit to/from Stand Sit to Stand: Max assist;+2 physical assistance         General transfer comment: Posterior bias  upon standing requiring cues for hip extension and upright posture.   Ambulation/Gait Ambulation/Gait assistance: Max assist;+2 physical assistance Ambulation Distance (Feet): 12 Feet Assistive device: Rolling walker (2 wheeled) Gait Pattern/deviations: Step-to pattern;Decreased stride length;Trunk flexed;Leaning posteriorly;Narrow base of support   Gait velocity interpretation: Below normal speed for age/gender General Gait Details: Pt requires verbal cues and weightshifting to advance LEs; continues to require assist for upright posture due to posterior lean through hips. Difficulty initiating steps without cues.    Stairs            Wheelchair Mobility    Modified Rankin (Stroke Patients Only) Modified Rankin (Stroke Patients Only) Pre-Morbid Rankin Score: Moderately severe disability Modified Rankin: Moderately severe disability     Balance Overall balance assessment: Needs assistance Sitting-balance support: Feet supported;No upper extremity supported Sitting balance-Leahy Scale: Fair Sitting balance - Comments: Able to sit EOB for short periods with Min guard assist however fatigues quickly leaning left.  Postural control: Left lateral lean Standing balance support: During functional activity Standing balance-Leahy Scale: Zero                      Cognition Arousal/Alertness: Awake/alert Behavior During Therapy: WFL for tasks assessed/performed Overall Cognitive Status: History of cognitive impairments - at baseline                      Exercises      General Comments General comments (skin integrity, edema, etc.): Pt's son present in room during session.      Pertinent Vitals/Pain Pain Assessment: No/denies pain    Home Living  Prior Function            PT Goals (current goals can now be found in the care plan section) Progress towards PT goals: Progressing toward goals    Frequency  Min 3X/week     PT Plan Current plan remains appropriate    Co-evaluation             End of Session Equipment Utilized During Treatment: Gait belt Activity Tolerance: Patient tolerated treatment well;Patient limited by fatigue Patient left: in chair;with call bell/phone within reach;with chair alarm set;with family/visitor present     Time: 1310-1330 PT Time Calculation (min) (ACUTE ONLY): 20 min  Charges:  $Gait Training: 8-22 mins                    G CodesAlvie Heidelberg:      Folan, Nachman Sundt A 12/23/2014, 2:17 PM Alvie HeidelbergShauna Folan, PT, DPT [phone]

## 2014-12-23 NOTE — Progress Notes (Signed)
[facility]; Alexis GoodellB Marylouise Mallet RN,BSN,MHA [phone]

## 2014-12-23 NOTE — Progress Notes (Signed)
[facility]. PA form completed by Dr. Randolm IdolFletke.  PA called into [employer] for review.  PA is pending for quantity limits.  Informed representative that Pt is doing a loading dose of 4 tablet x 7 days and then decrease to two tablets per day.  [name], [name], RN

## 2014-12-23 NOTE — Progress Notes (Signed)
Occupational Therapy Treatment Patient Details Name: Victoria HelperBetty C Lewis MRN: [id number] DOB: [birth year] Today's Date: 12/23/2014    History of present illness Victoria HelperBetty C Lewis is an 81 y.o. female with a past medical history significant for HTN, hyperlipidemia, CKD stage III, dementia, syncope, complete AV block, s/p loop recorder implantation, brought in via EMS as a code stroke due to acute onset of left face weakness, dysarthria, left hand weakness.NIHSS 5, but the only new findings are left face weakness and decreased left grip. CT (-). MRI- 2 subcentimeter foci of rete of acute ischemia in the R MCA.    OT comments  Son present and able to assist pt with transfers safely.  Pt limited by fatigue and general complaint of not feeling well.   Follow Up Recommendations  Supervision/Assistance - 24 hour;Home health OT    Equipment Recommendations  None recommended by OT    Recommendations for Other Services      Precautions / Restrictions Precautions Precautions: Fall       Mobility Bed Mobility Overal bed mobility: Needs Assistance Bed Mobility: Supine to Sit;Rolling Rolling: +2 for physical assistance;Total assist   Supine to sit: Max assist;HOB elevated Sit to supine: Total assist   General bed mobility comments: Pt able to assist with lifting her trunk from the bed   Transfers Overall transfer level: Needs assistance Equipment used: 2 person hand held assist Transfers: Sit to/from Stand Sit to Stand: Max assist;+2 physical assistance         General transfer comment: Pt with posterior bias     Balance Overall balance assessment: Needs assistance Sitting-balance support: Feet supported Sitting balance-Leahy Scale: Poor Sitting balance - Comments: Pt loses her balance posteriorly and to the Rt.  Postural control: Posterior lean;Right lateral lean Standing balance support: Single extremity supported                       ADL                            Toilet Transfer: Maximal assistance;+2 for physical assistance;Stand-pivot   Toileting- Clothing Manipulation and Hygiene: Total assistance;Sit to/from stand         General ADL Comments: Son present for session and was safely able to assist pt with transfers, and feels comfortable with assisting her at home      Vision                     Perception     Praxis      Cognition   Behavior During Therapy: [facility] for tasks assessed/performed Overall Cognitive Status: History of cognitive impairments - at baseline                       Extremity/Trunk Assessment               Exercises     Shoulder Instructions       General Comments      Pertinent Vitals/ Pain       Pain Assessment: No/denies pain  Home Living                                          Prior Functioning/Environment              Frequency Min 2X/week  Progress Toward Goals  OT Goals(current goals can now be found in the care plan section)     ADL Goals Pt Will Perform Grooming: with min guard assist;sitting Pt Will Perform Lower Body Dressing: with adaptive equipment;sit to/from stand;with max assist Pt Will Transfer to Toilet: with max assist;stand pivot transfer;bedside commode Pt Will Perform Toileting - Clothing Manipulation and hygiene: with max assist;sit to/from stand;with adaptive equipment  Plan Discharge plan remains appropriate    Co-evaluation                 End of Session     Activity Tolerance Patient limited by fatigue   Patient Left in bed;with call bell/phone within reach;with family/visitor present   Nurse Communication Mobility status        Time: 2130-8657 OT Time Calculation (min): 27 min  Charges: OT General Charges $OT Visit: 1 Procedure OT Treatments $Therapeutic Activity: 8-22 mins  Shianne Zeiser M 12/23/2014, 2:02 PM

## 2014-12-23 NOTE — Progress Notes (Signed)
BENEFIT CHECK FOR ELIQUIS ---12/22/2014 1614 by NIA SHEALY- CMA PT COPAY WILL BE $40 -PRIOR AUTH IS REQUIRED [phone]  Eliquis coupon card given to patient by Pharmacist

## 2014-12-24 ENCOUNTER — Other Ambulatory Visit: Payer: Self-pay | Admitting: Family Medicine

## 2014-12-24 MED ORDER — CEFTRIAXONE SODIUM 1 G IJ SOLR: 1.0000 g | INTRAMUSCULAR | Status: DC

## 2014-12-25 ENCOUNTER — Telehealth: Payer: Self-pay | Admitting: Family Medicine

## 2014-12-25 NOTE — Telephone Encounter (Addendum)
[facility]ray Alt(Margie) concerning the patient's ceftriaxone. For some reason, they were able to obtain the ceftriaxone powder for the patient's injection, however did not have the lidocaine diluent that it normally comes with. The pharmacy requested that an MD call in a RX so they could obtain the diluent for the subsequent injections from the warehouse.   She will be able to [facility] today[facility] [place] [place] [phone]) to [facility] with [facility] to have this appropriately ordered.  Delray AltMargie [phone]) to contact me via page if there are any other issues.  Joanna Puffrystal S. Dorsey, MD [facility] Resident  12/25/2014, 12:49 PM

## 2014-12-26 NOTE — Progress Notes (Signed)
PA was approved via [employer] until 06/24/2015.  One time for titration process though 12/30/14.  [facility]  Reference number: PA- [id number]. [name], Tamika L, RN

## 2014-12-27 ENCOUNTER — Encounter: Payer: Self-pay | Admitting: Family Medicine

## 2014-12-27 ENCOUNTER — Inpatient Hospital Stay: Payer: Medicare Other | Admitting: Family Medicine

## 2014-12-27 ENCOUNTER — Ambulatory Visit (INDEPENDENT_AMBULATORY_CARE_PROVIDER_SITE_OTHER): Payer: Medicare Other | Admitting: Family Medicine

## 2014-12-27 VITALS — BP 126/76 | HR 93 | Temp 98.4°F

## 2014-12-27 DIAGNOSIS — H6120 Impacted cerumen, unspecified ear: Secondary | ICD-10-CM | POA: Insufficient documentation

## 2014-12-27 DIAGNOSIS — H6123 Impacted cerumen, bilateral: Secondary | ICD-10-CM

## 2014-12-27 DIAGNOSIS — R05 Cough: Secondary | ICD-10-CM

## 2014-12-27 DIAGNOSIS — N390 Urinary tract infection, site not specified: Secondary | ICD-10-CM

## 2014-12-27 DIAGNOSIS — I82409 Acute embolism and thrombosis of unspecified deep veins of unspecified lower extremity: Secondary | ICD-10-CM | POA: Insufficient documentation

## 2014-12-27 DIAGNOSIS — I639 Cerebral infarction, unspecified: Secondary | ICD-10-CM | POA: Diagnosis not present

## 2014-12-27 DIAGNOSIS — I82401 Acute embolism and thrombosis of unspecified deep veins of right lower extremity: Secondary | ICD-10-CM | POA: Diagnosis not present

## 2014-12-27 DIAGNOSIS — R059 Cough, unspecified: Secondary | ICD-10-CM | POA: Insufficient documentation

## 2014-12-27 MED ORDER — CARBAMIDE PEROXIDE 6.5 % OT SOLN: 5.0000 [drp] | Freq: Two times a day (BID) | OTIC | Status: DC

## 2014-12-27 NOTE — Assessment & Plan Note (Signed)
Cerumen impaction bilaterally. -start debrox drops.

## 2014-12-27 NOTE — Patient Instructions (Addendum)
Great to see you today. - Continue Eliquis 10mg  BID until 12/29/2014 when you will decrease to 5mg  BID.  - Let us know if the diarrhea continues - Continue the amoxicillin and ceftriaxone - Continue topical estrogen - Use the Flonase for the cough - Use the Debrox drops in both ears

## 2014-12-27 NOTE — Progress Notes (Signed)
Subjective:     Patient ID: Victoria Lewis, female   DOB: Mar 13, 1928, 81 y.o.   MRN: [id number]  Scribed by Alfonse FlavorsLouisa Alisyn Lequire, MS3  HPI Victoria Lewis is an 81 year old woman who presents today for hospital follow-up. Admitted 12/19/14 - 12/23/14 with CVA and UTI. Reviewed discharge summary. History given by daughter and husband.    Stroke/DVT FU: Found to have CVA related to DVT and PFO. No slurred speech or further deficits noted by family since discharge. She is back to her baseline mental status and functional capacity. She is taking Eliquis 10mg  BID until 12/29/2014 when she will decrease to 5mg  once daily. She is continuing the atorvastatin started in hospital. Aspirin was stopped in the hospital. No bleeding episodes.    Cough: Began after hospital discharge with rhinorrhea. No fever. Patient usually experiences allergy symptoms this time of year. Previously prescribed Flonase which her daughters have not been giving to her.   UTI: Found to have UTI with greater than 100,000 colonies of both E. Coli and Enterococcus. Completed 5 days of antibiotics in the hospital. Ceftriaxone administered Sunday 4/17 at 2:30 due to miscommunication with RN getting to the house to administer IM injection on Saturday 4/16. Started amoxicillin on Friday 4/15. She has stopped the topical estrogen since discharge. Patient's daughter felt that this was helping in general and would like to continue this medication.   Diarrhea: Present since hospital discharge. Experienced worst episodes of profuse watery diarrhea on Sunday. First normal BM this morning. Good appetite since hospital discharge.  Social - lives with husband, is reliant on her husband and daughter's for help with ADL's.   Review of Systems  Constitutional: Negative for fever and chills.  Respiratory: Positive for cough. Negative for shortness of breath.   Cardiovascular: Negative for chest pain.  Gastrointestinal: Positive for diarrhea. Negative for  nausea and vomiting.  See HPI     Objective:   Physical Exam  Vitals: reviewed Gen: well-appearing, NAD HEENT: PERRLA, EOMI, no rhinorrhea, nasal septum midline, MMM, No pharyngeal erythema. No submandibular or cervical LAD. TMs occluded by cerumen bilaterally.  CV: RRR, no MRG, nl S1 and S2 Pulmonary: CTA left, faint crackles in RLL. Normal work of breathing Abdominal: normoactive bowel sounds, no tenderness to palpation  Neuro: CNII- XII intact. Oriented to self. Grip strength 5/5. Hip flexion 4/5 on left and 3/5 on right. Knee extension 4/5 on left and 3/5 on right. Sensation grossly intact to light touch.  Skin: crusted, tan-colored papular lesion on nose. Scabbed 1cm lesion on right cheek. Nevus with irregular border on left cheek. MSK: sitting in wheelchair    Assessment:     Please see Problem List.     Plan:     Please see Problem List.       I personally saw and evaluated the patient. I have reviewed the medical student note and agree with the documentation. I personally completed the physical exam. Additions to the medical student note are made in blue.   Donnella ShamKyle Fletke MD

## 2014-12-27 NOTE — Assessment & Plan Note (Signed)
Acute right femoral vein DVT found during hospitalization. Patient stable on Eliquis. -continue Eliquis

## 2014-12-27 NOTE — Assessment & Plan Note (Signed)
CVA secondary to DVT with PFO. Symptoms have resolved. -Continue Eliquis for treatment of DVT -hold asa while on Eliquis -continue Lipitor that was started in hospital

## 2014-12-27 NOTE — Assessment & Plan Note (Signed)
Patient has cough that is likely related to allergic rhinitis/postnasal drip/atelectasis. Low likelihood for PNA as currently on Rocephin and Amoxicillin for UTI. - patient to re-start taking Flonase -encourage ambulation when patient able to

## 2014-12-27 NOTE — Assessment & Plan Note (Signed)
Found to have E. Coli and Enterococcus UTI during recent hospital stay. Appears to be responding to treatment.  -complete treatment with Rocephin and Amoxicillin

## 2014-12-30 ENCOUNTER — Ambulatory Visit (INDEPENDENT_AMBULATORY_CARE_PROVIDER_SITE_OTHER): Payer: Medicare Other | Admitting: *Deleted

## 2014-12-30 DIAGNOSIS — R55 Syncope and collapse: Secondary | ICD-10-CM

## 2015-01-04 NOTE — Progress Notes (Signed)
Loop recorder 

## 2015-01-05 ENCOUNTER — Encounter: Payer: Self-pay | Admitting: Internal Medicine

## 2015-01-13 ENCOUNTER — Ambulatory Visit (INDEPENDENT_AMBULATORY_CARE_PROVIDER_SITE_OTHER): Payer: Medicare Other | Admitting: Family Medicine

## 2015-01-13 ENCOUNTER — Encounter: Payer: Self-pay | Admitting: Family Medicine

## 2015-01-13 VITALS — BP 133/77 | HR 112 | Temp 97.7°F | Ht 64.0 in

## 2015-01-13 DIAGNOSIS — B372 Candidiasis of skin and nail: Secondary | ICD-10-CM

## 2015-01-13 MED ORDER — CLOTRIMAZOLE-BETAMETHASONE 1-0.05 % EX CREA: 1.0000 "application " | TOPICAL_CREAM | Freq: Two times a day (BID) | CUTANEOUS | Status: DC

## 2015-01-13 MED ORDER — PREMARIN 0.625 MG/GM VA CREA
1.0000 | TOPICAL_CREAM | Freq: Every day | VAGINAL | Status: DC
Start: 2015-01-13 — End: 2015-03-07

## 2015-01-13 NOTE — Patient Instructions (Signed)
Apply lotrisone cream 1-2 times a day until rash clears up Refilled the premarin/estrogen cream  Follow up with Dr. Randolm IdolFletke if not improving  Be well, Dr. Pollie MeyerMcIntyre

## 2015-01-14 ENCOUNTER — Other Ambulatory Visit: Payer: Self-pay | Admitting: Internal Medicine

## 2015-01-14 ENCOUNTER — Other Ambulatory Visit: Payer: Self-pay | Admitting: Family Medicine

## 2015-01-17 NOTE — Progress Notes (Signed)
Patient ID: [name], female   DOB: 22-Oct-1927, 81 y.o.   MRN: [id number] Date of Visit: 01/13/2015    HPI:  Pt presents for a same day appointment to discuss rash in genital area. Has tried applying ointment without help. Yesterday had a little bit of diarrhea. Eating well. Has used premarin cream recently to help prevent UTI's, which has decreased the frequency of her UTI's. Needs refill on this. Hx is provided by pt's son and husband since she is demented so hx is limited.  ROS: See HPI  PMFSH: hx heart block, HTN, stroke, DVT, recurrent UTI's, dementia  PHYSICAL EXAM: BP 133/77 mmHg  Pulse 112  Temp(Src) 97.7 F (36.5 C) (Oral)  Ht 5\' 4"  (1.626 m)  Wt  Gen: NAD HEENT: NCAT Skin: intertriginous rash with some slight erythematous satellite areas in genital area, consistent with candidal infection  ASSESSMENT/PLAN:  1. Rash - appearance consistent with candidal intertrigo, possibly some component of contact dermatitis as well. Will rx lotrisone cream. F/u with PCP if not improving. Also gave refill on premarin cream to help with UTI prevention since this seems to have worked nicely for her. Precepted with Dr. Jennette KettleNeal who also examined patient and agrees with this plan.   FOLLOW UP: F/u as needed if symptoms worsen or do not improve.   Victoria J. Pollie MeyerMcIntyre, MD [facility]

## 2015-01-20 ENCOUNTER — Other Ambulatory Visit: Payer: Self-pay | Admitting: Family Medicine

## 2015-01-24 ENCOUNTER — Ambulatory Visit (INDEPENDENT_AMBULATORY_CARE_PROVIDER_SITE_OTHER): Payer: Medicare Other | Admitting: Family Medicine

## 2015-01-24 ENCOUNTER — Encounter: Payer: Self-pay | Admitting: Family Medicine

## 2015-01-24 VITALS — BP 116/66 | HR 90 | Temp 98.3°F | Ht 64.0 in

## 2015-01-24 DIAGNOSIS — L989 Disorder of the skin and subcutaneous tissue, unspecified: Secondary | ICD-10-CM

## 2015-01-24 DIAGNOSIS — L304 Erythema intertrigo: Secondary | ICD-10-CM

## 2015-01-24 DIAGNOSIS — I82401 Acute embolism and thrombosis of unspecified deep veins of right lower extremity: Secondary | ICD-10-CM | POA: Diagnosis not present

## 2015-01-24 MED ORDER — NYSTATIN 100000 UNIT/GM EX POWD: CUTANEOUS | Status: DC

## 2015-01-24 NOTE — Assessment & Plan Note (Signed)
The patient has multiple lesions of the face most consistent with SK. The largest are an irritated area of the right cheek and one on the nose. Differential also included AK vs SCC. No concern for melanoma. A discussion with the family about biopsy vs destructive therapy was had today. I explained that I can not completely rule out a more serious lesion than irritated SK based on appearance alone. However, given the patient's severe dementia and multiple medical issues it was decided to pursue cryotherapy.  -cryotherapy of 2 skin lesions performed (see procedure note).

## 2015-01-24 NOTE — Patient Instructions (Signed)
It was nice to see you today.  Intertrigo - start Nystatin powder daily  Blood clot - continue Eliquis  Skin lesion - should start to fall off in the next few days.   Return in 1-2 months.

## 2015-01-24 NOTE — Assessment & Plan Note (Signed)
Patient stable on Eliquis. No further signs/symtoms of DVT.

## 2015-01-24 NOTE — Progress Notes (Signed)
Subjective:     Patient ID: Victoria Lewis, female   DOB: 11/27/1927, 81 y.o.   MRN: [id number] Scribe: Alfonse FlavorsLouisa Faraz Ponciano, MS3 HPI [name] is an 81 year old female who presents today for follow-up. History provided by husband and daughter.  Candidal Intertrigo: Clotimrazole-betamethasone cream helped rash, but the rash remains after completion of tube. Patient is incontinent so remains moist in the groin area.   DVT: Patient is taking 5mg  Eliquis BID. No bleeding issues reported. No leg pain or cramping.   Facial skin lesions - patient continues to have multiple exophytic skin lesions which she picks at, the lesion on her right cheek bleeds occasionally  Social - lives with husband  Review of Systems See HPI.    Objective:   Physical Exam BP 116/66 mmHg  Pulse 90  Temp(Src) 98.3 F (36.8 C) (Oral)  Ht 5\' 4"  (1.626 m)  Wt  Gen: well appearing female in NAD HEENT: PERRLA, EOMI. MMM. Clear oropharynx.  No submandibular LAD.  CV: RRR, no MRG, nl S1 and S2, no edema bilaterally Pulm: CTA bilaterally, no crackles or wheezing Abdomen: normoactive bowel sounds, soft and nontender abdomen  Neuro: 5/5 grip strength, 4/5 leg strength on right, 5/5 leg strength on left Skin: hyperpigmented/bloody "stuck on" crusted lesion of right cheek, yellow "stuck on" papule of nose  Procedure Note: Verbal consent obtained. Risks and benefits disucused. Cryotherapy for 3x freeze/thaw cylces performed on lesion of right cheek and lesion of nose.     Assessment:     Please see Problem List.     Plan:     Please see Problem List.     I personally saw and evaluated the patient. I have reviewed the medical student note and agree with the documentation. I personally completed the physical exam. Additions to the medical student note are made in blue.   Donnella ShamKyle Fletke MD

## 2015-01-24 NOTE — Assessment & Plan Note (Signed)
Intertrigo of groin persists despite treatment with Lotrisone cream. -change to Nystatin powder to help decrease moisture.

## 2015-01-26 ENCOUNTER — Other Ambulatory Visit: Payer: Self-pay | Admitting: Family Medicine

## 2015-01-30 ENCOUNTER — Ambulatory Visit (INDEPENDENT_AMBULATORY_CARE_PROVIDER_SITE_OTHER): Payer: Medicare Other | Admitting: *Deleted

## 2015-01-30 DIAGNOSIS — R55 Syncope and collapse: Secondary | ICD-10-CM | POA: Diagnosis not present

## 2015-02-02 LAB — CUP PACEART REMOTE DEVICE CHECK: Date Time Interrogation Session: 20160526102009

## 2015-02-03 NOTE — Progress Notes (Signed)
Loop recorder 

## 2015-02-10 ENCOUNTER — Ambulatory Visit (INDEPENDENT_AMBULATORY_CARE_PROVIDER_SITE_OTHER): Payer: Medicare Other | Admitting: Family Medicine

## 2015-02-10 ENCOUNTER — Encounter: Payer: Self-pay | Admitting: Family Medicine

## 2015-02-10 VITALS — BP 131/70 | HR 114 | Temp 97.9°F

## 2015-02-10 DIAGNOSIS — N390 Urinary tract infection, site not specified: Secondary | ICD-10-CM

## 2015-02-10 DIAGNOSIS — R300 Dysuria: Secondary | ICD-10-CM | POA: Diagnosis not present

## 2015-02-10 LAB — CUP PACEART REMOTE DEVICE CHECK: Date Time Interrogation Session: 20160603132746

## 2015-02-10 LAB — POCT URINALYSIS DIPSTICK
Bilirubin, UA: NEGATIVE
GLUCOSE UA: NEGATIVE
Ketones, UA: NEGATIVE
Nitrite, UA: POSITIVE
PH UA: 6
Protein, UA: NEGATIVE
SPEC GRAV UA: 1.01
Urobilinogen, UA: 0.2

## 2015-02-10 LAB — POCT UA - MICROSCOPIC ONLY

## 2015-02-10 MED ORDER — CEFTRIAXONE SODIUM 1 G IJ SOLR
250.0000 mg | Freq: Once | INTRAMUSCULAR | Status: AC
  Administered 2015-02-10: 250 mg via INTRAMUSCULAR

## 2015-02-10 MED ORDER — CEPHALEXIN 500 MG PO CAPS: 500.0000 mg | ORAL_CAPSULE | Freq: Four times a day (QID) | ORAL | Status: DC

## 2015-02-10 NOTE — Patient Instructions (Signed)
You do have what looks to be a urinary tract infection again. I have sent your urine off for culture you when he'll from us sometime next week on the results. We will treat you with a Rocephin shot today, and Keflex oral medication that you will take for 7 days, 4 times a day. Please follow-up with your PCP in 1-2 weeks, sooner if no improvement.

## 2015-02-11 ENCOUNTER — Encounter: Payer: Self-pay | Admitting: Family Medicine

## 2015-02-11 NOTE — Assessment & Plan Note (Signed)
Victoria Lewis is a 81 y.o. female, wheelchair bound, present for confusion and "not acting herself" per family. Significant past medical history for recurrent UTIs. - Exam today was normal, although patient has dementia. Considering family concern and history of her not acting herself, urinalysis was ordered. UA today with +3 bacteria, loaded white blood cells, small red blood cells, 1-5 epithelial cells, large nitrites. Urine culture in process. - Patient given IM Rocephin 250 mg, and started on Keflex 500 mg 4 times a day for 7 days. Follow-up with her PCP in 1-2 weeks. Sooner if needed.

## 2015-02-11 NOTE — Progress Notes (Signed)
   Subjective:    Patient ID: Victoria Lewis, female    DOB: 1928/09/09, 81 y.o.   MRN: [id number]  HPI  Confusion: Presents to family medicine for same-day appointment, with family complaints of her not acting like her normal self. Majority of history was given by patient's husband and son. Patient has dementia, as well as significant past medical history of recurrent UTIs approximately every 1-2 months. Past urinary tract infections has cultured Escherichia coli. Patient family states she is eating and drinking her normal, they haven't noticed any fevers, but feels like she is acting more fatigued and may be more confused. Her last urinary tract infection was on 12/27/2014. They are uncertain if any urinary frequency secondary to incontinence. Patient is cared for in the home.  Never Smoker Past Medical History  Diagnosis Date  . Hypertension   . GERD (gastroesophageal reflux disease)   . H/O hiatal hernia   . Arthritis   . Anemia     iron  . Syncope 07/13/2014  . UTI (lower urinary tract infection)   . Chronic kidney disease (CKD), stage III (moderate)     [name]/notes 08/30/2014  . Bladder infection, chronic     [name]/notes 08/30/2014  . Dementia     [name]/notes 08/30/2014  . AV block, complete   . Chest pain   . Decreased dorsalis pedis pulse   . Hammer toe   . Hyperlipidemia   . Anemia   . Onychogryposis of toenail   . Osteoporosis   . Scoliosis   . Status post placement of implantable loop recorder   . Syncope   . Uterine disorder   . Diaphragmatic hernia   . Hx of epistaxis   . Peptic ulcer   . Chest pain    No Known Allergies Past Surgical History  Procedure Laterality Date  . Joint replacement Left 2008  . Eye surgery Bilateral     cataracts   . Total hip arthroplasty Right 10/11/2013    Procedure: TOTAL HIP ARTHROPLASTY;  Surgeon: Nestor LewandowskyFrank J Rowan, MD;  Location: [facility] OR;  Service: Orthopedics;  Laterality: Right;  . Loop recorder implant      [name]/notes 08/30/2014  . Tubal  ligation      [name]/notes 01/22/2011  . Total hip arthroplasty  05/2006    [name]/notes 01/22/2011   Review of Systems Per HPI    Objective:   Physical Exam BP 131/70 mmHg  Pulse 114  Temp(Src) 97.9 F (36.6 C) (Oral) Gen: NAD. Nontoxic in appearance, well-developed, well-nourished, in wheelchair. Appears happy. HEENT: AT. [place].  Bilateral eyes without injections or icterus. mildly dry mucous membranes.  CV:  tachycardic, regular rhythm Abd: Soft. round . NTND. BS present   No mass palpated.  Neuro: PERLA. EOMi. Alert. Answers yes/no questions only.     Assessment & Plan:  Victoria Lewis is a 81 y.o. female, wheelchair bound, present for confusion and "not acting herself" per family. Significant past medical history for recurrent UTIs. - Exam today was normal, although patient has dementia. Considering family concern and history of her not acting herself, urinalysis was ordered. UA today with +3 bacteria, loaded white blood cells, small red blood cells, 1-5 epithelial cells, large nitrites. Urine culture in process. - Patient given IM Rocephin 250 mg, and started on Keflex 500 mg 4 times a day for 7 days. Follow-up with her PCP in 1-2 weeks. Sooner if needed.

## 2015-02-12 ENCOUNTER — Telehealth: Payer: Self-pay | Admitting: Family Medicine

## 2015-02-12 MED ORDER — NITROFURANTOIN MONOHYD MACRO 100 MG PO CAPS: 100.0000 mg | ORAL_CAPSULE | Freq: Two times a day (BID) | ORAL | Status: DC

## 2015-02-12 NOTE — Telephone Encounter (Signed)
Emergency Line Call  Pt's daughter in law Kathaleen Grinder(Lisa Florendo) calls to report pt continues to be very tired, not as talkative as normal, not eating as well as normal. She is still talking / responding like normal but she "doesn't feel as well as normal." She has not been running fevers and otherwise is behaving normally. She has not had blood in her urine, vomiting, or abdominal pain. Pt was seen 6/3 by Dr. Claiborne BillingsKuneff, and urine cx was obtained; pt had a shot of Rocephin and was started on Keflex at that time, but daughter in law feels that these abx have not been helpful, so far.  Urine culture shows E.coli but no sensitivity is back yet. Pt had a recent urine culture that showed E.coli and Enterococcus, but susceptible to Macrobid. Will call in Rx for Macrobid, but instructed daughter in law to bring pt to the hospital if she has any worsening overnight, or to the office in the morning if pt is not improving. Daughter in law voiced understanding.  Note FYI to Dr. Claiborne BillingsKuneff, who saw pt on Friday, and PCP Dr. Randolm IdolFletke so final urine cx can be followed up.  Bobbye Mortonhristopher M Vegas Coffin, MD PGY-3, [facility] 02/12/2015, 12:31 PM

## 2015-02-13 ENCOUNTER — Telehealth: Payer: Self-pay | Admitting: Family Medicine

## 2015-02-13 LAB — URINE CULTURE

## 2015-02-13 NOTE — Telephone Encounter (Signed)
Please call Ms. Mcconville's family and inform them that the culture returned and is sensitive to the  antibiotics that were called in for her. I see in the chart there was a call yesterday to the 24 hour line and a different antibiotic was called in because they felt she was not getting better. It is also sensitive to the second antibiotic as well. She should take one of them to completion for full coverage. If they are not seeing any improvement, then they should either schedule an appointment with PCP or go to ED. Thanks.

## 2015-02-13 NOTE — Telephone Encounter (Signed)
Spoke with patient's husband.  Informed him that the culture was sensitive to the antibiotics that were called in for her.  Pt should continue with complete the full course of the medication.  If no improvement after completion patient should schedule an appt or go to ED.  Husband stated understanding.  [name], Larissa Pegg L, RN

## 2015-02-16 ENCOUNTER — Ambulatory Visit (INDEPENDENT_AMBULATORY_CARE_PROVIDER_SITE_OTHER): Payer: Medicare Other | Admitting: Family Medicine

## 2015-02-16 ENCOUNTER — Encounter: Payer: Self-pay | Admitting: Family Medicine

## 2015-02-16 ENCOUNTER — Telehealth: Payer: Self-pay | Admitting: Family Medicine

## 2015-02-16 VITALS — BP 136/68 | HR 94 | Temp 98.1°F

## 2015-02-16 DIAGNOSIS — N390 Urinary tract infection, site not specified: Secondary | ICD-10-CM | POA: Diagnosis not present

## 2015-02-16 MED ORDER — CEPHALEXIN 500 MG PO CAPS: 500.0000 mg | ORAL_CAPSULE | Freq: Four times a day (QID) | ORAL | Status: DC

## 2015-02-16 NOTE — Telephone Encounter (Signed)
Tried calling, line busy. Victoria Lewis not listed on list of people we can give patient information to (see ROI scanned under media tab). Results were given to patient husband on 6/6, she should contact him. Please inform if she calls back.

## 2015-02-16 NOTE — Telephone Encounter (Signed)
Victoria Lewis called and would like the results from her mother test. jw

## 2015-02-16 NOTE — Progress Notes (Signed)
   Subjective:    Patient ID: Victoria Lewis, female    DOB: March 11, 1928, 81 y.o.   MRN: [id number]  Seen for Same day visit for   CC: UTI  She reports "not feeling well". Family report that she has been more fatigue. Recently diagnosed with UTI; she initially improved on Keflex but then starting getting worse Monday after switched to Macrobid. She denies dysuria, abdominal pian, fevers, chills, cough, CP or SOB.   Review of Systems   See HPI for ROS. Objective:  BP 136/68 mmHg  Pulse 94  Temp(Src) 98.1 F (36.7 C) (Oral)  Wt   General: NAD; in wheelchair Cardiac: RRR, normal heart sounds, no murmurs. 2+ radial and PT pulses bilaterally Respiratory: CTAB, normal effort Abdomen: soft, nontender, nondistended,  Bowel sounds present Extremities: no edema or cyanosis. WWP. Skin: warm and dry, no rashes noted Neuro: alert and oriented, no focal deficits    Assessment & Plan:  See Problem List Documentation

## 2015-02-17 LAB — BASIC METABOLIC PANEL WITH GFR
BUN: 11 mg/dL (ref 6–23)
CO2: 22 mEq/L (ref 19–32)
CREATININE: 0.94 mg/dL (ref 0.50–1.10)
Calcium: 9.3 mg/dL (ref 8.4–10.5)
Chloride: 100 mEq/L (ref 96–112)
GFR, Est African American: 64 mL/min
GFR, Est Non African American: 55 mL/min — ABNORMAL LOW
Glucose, Bld: 87 mg/dL (ref 70–99)
Potassium: 4.1 mEq/L (ref 3.5–5.3)
SODIUM: 135 meq/L (ref 135–145)

## 2015-02-17 LAB — CBC WITH DIFFERENTIAL/PLATELET
BASOS ABS: 0.1 10*3/uL (ref 0.0–0.1)
Basophils Relative: 1 % (ref 0–1)
Eosinophils Absolute: 0.4 10*3/uL (ref 0.0–0.7)
Eosinophils Relative: 5 % (ref 0–5)
HCT: 34.7 % — ABNORMAL LOW (ref 36.0–46.0)
HEMOGLOBIN: 11.4 g/dL — AB (ref 12.0–15.0)
Lymphocytes Relative: 16 % (ref 12–46)
Lymphs Abs: 1.2 10*3/uL (ref 0.7–4.0)
MCH: 28.4 pg (ref 26.0–34.0)
MCHC: 32.9 g/dL (ref 30.0–36.0)
MCV: 86.3 fL (ref 78.0–100.0)
MPV: 9.1 fL (ref 8.6–12.4)
Monocytes Absolute: 0.7 10*3/uL (ref 0.1–1.0)
Monocytes Relative: 9 % (ref 3–12)
Neutro Abs: 5.4 10*3/uL (ref 1.7–7.7)
Neutrophils Relative %: 69 % (ref 43–77)
Platelets: 401 10*3/uL — ABNORMAL HIGH (ref 150–400)
RBC: 4.02 MIL/uL (ref 3.87–5.11)
RDW: 15.3 % (ref 11.5–15.5)
WBC: 7.8 10*3/uL (ref 4.0–10.5)

## 2015-02-28 ENCOUNTER — Ambulatory Visit (INDEPENDENT_AMBULATORY_CARE_PROVIDER_SITE_OTHER): Payer: Medicare Other | Admitting: Family Medicine

## 2015-02-28 ENCOUNTER — Encounter: Payer: Self-pay | Admitting: Family Medicine

## 2015-02-28 VITALS — BP 126/62 | HR 91 | Temp 98.3°F | Ht 64.0 in

## 2015-02-28 DIAGNOSIS — L304 Erythema intertrigo: Secondary | ICD-10-CM

## 2015-02-28 DIAGNOSIS — N390 Urinary tract infection, site not specified: Secondary | ICD-10-CM | POA: Diagnosis not present

## 2015-02-28 MED ORDER — NYSTATIN 100000 UNIT/GM EX POWD: CUTANEOUS | Status: DC

## 2015-02-28 NOTE — Assessment & Plan Note (Addendum)
Currently using nystatin powder twice per day on groin area. On exam, intertrigo notably improved with mild remaining erythema. - refill of nystatin power  Victoria Lewis, MS3  I agree with the medical student documentation. Intertrigo improved. -continue Nystatin powder  Donnella Sham MD

## 2015-02-28 NOTE — Assessment & Plan Note (Addendum)
Symptoms significantly improved on course of Keflex 500mg  QID (one dose remaining). - complete course of Keflex - follow up if symptoms return  Alfonse Flavors, MS3  I agree with the medical student documentation. UTI symptoms resolved. -complete course of Keflex -family has not been applying topical estrogen. They have a tube remaining at home. They may use this is they feel like it helps to decrease UTI frequency.  Donnella Sham MD

## 2015-02-28 NOTE — Progress Notes (Signed)
Subjective:     Patient ID: Victoria Lewis, female   DOB: [birth year], 81 y.o.   MRN: [id number] Written by: Alfonse Flavors, MS3 HPI Victoria Lewis is an 81 year old female seen today for follow-up on her UTI. History is provided by her daughter and husband.   Recurrent UTI: She was seen on 02/10/15 for increased confusion and fatigue and diagnosed with a UTI. She was given IM Rocephin and Keflex. Her daughter called the next day to reports worsening of symptoms, and the medication was changed to Nitrofurantoin. They returned to clinic on 02/16/15 due to increased fatigue and were switched back to Keflex. Since then, fatigue and confusion have improved significantly. Denies dysuria and urinary frequency. Last dose of Keflex scheduled for today. Tolerating diet, no nausea/emesis/diarrhea.  Patient is remaining hydrated on cranberry concentrate, smart water, and Gatorade. They are not using the topical estrogen cream.   Intertriginous Candida: They are using nystatin power twice daily with significant improvement. Erythematous areas on the upper thigh remain, but daughter denies any further breaks in the skin or pustules. No fevers or chills. Family changing diaper frequently to avoid sitting in urine/stool.   Review of Systems See HPI.    Objective:   Physical Exam BP 126/62 mmHg  Pulse 91  Temp(Src) 98.3 F (36.8 C) (Oral)  Ht 5\' 4"  (1.626 m)  Wt  Gen: well-appearing female in a wheelchair, due to advanced dementia did not participate in the visit HEENT: EOMI, PERRLA. No submandibular, cervical, or supraclavicular LAD CV: RRR, no MRG, nl S1 and S2. 2+ lower extremity edema bilaterally.  Pulm: lungs CTA bilaterally, no crackles or wheezes Abdomen: normoactive bowel sounds, soft and non-tender throughout. Skin: No skin turgor. Lesions on right check and nose healed post-cryotherapy on 01/24/15. Intertriginous erythema in groin. Intertrigo significantly improved from previous visit.       Assessment:     Please see Problem List.     Plan:     Please see Problem List.      I personally saw and evaluated the patient. I have reviewed the medical student note and agree with the documentation. I personally completed the physical exam. Additions to the medical student note are made in blue.   Donnella Sham MD

## 2015-03-01 ENCOUNTER — Ambulatory Visit (INDEPENDENT_AMBULATORY_CARE_PROVIDER_SITE_OTHER): Payer: Medicare Other | Admitting: *Deleted

## 2015-03-01 ENCOUNTER — Encounter: Payer: Self-pay | Admitting: Internal Medicine

## 2015-03-01 DIAGNOSIS — R55 Syncope and collapse: Secondary | ICD-10-CM | POA: Diagnosis not present

## 2015-03-02 NOTE — Progress Notes (Signed)
Loop recorder 

## 2015-03-03 ENCOUNTER — Encounter: Payer: Self-pay | Admitting: Family Medicine

## 2015-03-03 ENCOUNTER — Ambulatory Visit (INDEPENDENT_AMBULATORY_CARE_PROVIDER_SITE_OTHER): Payer: Medicare Other | Admitting: Family Medicine

## 2015-03-03 DIAGNOSIS — N390 Urinary tract infection, site not specified: Secondary | ICD-10-CM | POA: Diagnosis not present

## 2015-03-03 LAB — POCT UA - MICROSCOPIC ONLY

## 2015-03-03 LAB — POCT URINALYSIS DIPSTICK
Bilirubin, UA: NEGATIVE
Glucose, UA: NEGATIVE
Ketones, UA: NEGATIVE
NITRITE UA: NEGATIVE
PH UA: 6
PROTEIN UA: NEGATIVE
Spec Grav, UA: 1.01
UROBILINOGEN UA: 0.2

## 2015-03-03 MED ORDER — CIPROFLOXACIN HCL 500 MG PO TABS: 500.0000 mg | ORAL_TABLET | Freq: Two times a day (BID) | ORAL | Status: DC

## 2015-03-03 NOTE — Patient Instructions (Addendum)
It looks like she has a bladder infection again. In her age group, there is something called asymptomatic bacteriuria.  Google that term to learn more.  Bottom line: We only treat if she is having symptoms.  I am not sure she needs treatment at this moment. Fill the antibiotic prescription if she has symptoms over the weekend. I will call Monday to check on how she is doing and to give you the culture results.

## 2015-03-06 ENCOUNTER — Other Ambulatory Visit: Payer: Self-pay

## 2015-03-06 ENCOUNTER — Encounter: Payer: Self-pay | Admitting: Family Medicine

## 2015-03-06 NOTE — Assessment & Plan Note (Addendum)
We obtained a cath specimen with some difficulty and with the help of family. Cath UA looks infected.  It is unclear to me whether this is asymptomatic bacteriuia or symptomatic UTI. Cultured urine.  Given written prescription for Cipro.  Told not to fill unless becomes symptomatic.  FU call 6/27.  Culture growing >100,000 Gm + cocci (presumed enterococcus.)  Called husband.  Victoria Lewis did well over the WE and he did not fill the cipro.  Told him to destroy that RX.  If she becomes symptomatic, would treat with amox.  He will monitor her for symptoms.

## 2015-03-06 NOTE — Progress Notes (Signed)
Spoke with the patient's daughter Victoria Lewis over the telephone. She wanted to clarify the discussion that was had between Dr. Leveda AnnaHensel and Mr. [name] (the patient's husband). Per the conversation earlier today the patient has been doing well over the weekend. It was decided to not treat the UTI as it likely represented asymptomatic bacteruria. Urine culture grew greater than 100, 000 Enterococcus resistant to PO medications.   The daughter paints a slightly different story. Her mother was not acting herself on Saturday 6/25. Poor po intake and more somnolent. Symptoms generally were improved on 6/26. Today she is still somewhat somnolent and having poor PO intake. After a discussion with the daughter who is also the POA it was decided to closely watch and wait. Return precautions and indications to go to the ED were discussed with the patient's daughter.   Donnella ShamKyle Stiles Maxcy MD

## 2015-03-06 NOTE — Progress Notes (Signed)
   Subjective:    Patient ID: Victoria HelperBetty C Lewis, female    DOB: 08-Jun-1928, 81 y.o.   MRN: [id number]  HPI Just completed course of Keflex for UTI.  Now with vague weakness.  No documented fever or flank pain.  No abd pain.  Seems to be better today than yesterday. Has dementia and unable to give history.  Accompanied by husband and son.      Review of Systems     Objective:   Physical Exam  No abd pain or CVA tenderness.  Normal VS noted. Lungs clear Skin, no breakdown.        Assessment & Plan:

## 2015-03-07 ENCOUNTER — Inpatient Hospital Stay (HOSPITAL_COMMUNITY)
Admission: AD | Admit: 2015-03-07 | Discharge: 2015-03-08 | DRG: 690 | Disposition: A | Discharge status: Home / Self Care | Payer: Medicare Other | Source: Ambulatory Visit | Attending: Family Medicine | Admitting: Family Medicine

## 2015-03-07 ENCOUNTER — Ambulatory Visit (INDEPENDENT_AMBULATORY_CARE_PROVIDER_SITE_OTHER): Payer: Medicare Other | Admitting: Family Medicine

## 2015-03-07 ENCOUNTER — Encounter: Payer: Self-pay | Admitting: Family Medicine

## 2015-03-07 ENCOUNTER — Encounter (HOSPITAL_COMMUNITY): Payer: Self-pay | Admitting: General Practice

## 2015-03-07 VITALS — BP 135/63 | HR 85 | Temp 97.9°F

## 2015-03-07 DIAGNOSIS — Z7901 Long term (current) use of anticoagulants: Secondary | ICD-10-CM

## 2015-03-07 DIAGNOSIS — N183 Chronic kidney disease, stage 3 unspecified: Secondary | ICD-10-CM | POA: Diagnosis present

## 2015-03-07 DIAGNOSIS — Z823 Family history of stroke: Secondary | ICD-10-CM | POA: Diagnosis not present

## 2015-03-07 DIAGNOSIS — K219 Gastro-esophageal reflux disease without esophagitis: Secondary | ICD-10-CM | POA: Diagnosis present

## 2015-03-07 DIAGNOSIS — B952 Enterococcus as the cause of diseases classified elsewhere: Secondary | ICD-10-CM | POA: Diagnosis present

## 2015-03-07 DIAGNOSIS — F0390 Unspecified dementia without behavioral disturbance: Secondary | ICD-10-CM

## 2015-03-07 DIAGNOSIS — I129 Hypertensive chronic kidney disease with stage 1 through stage 4 chronic kidney disease, or unspecified chronic kidney disease: Secondary | ICD-10-CM | POA: Diagnosis present

## 2015-03-07 DIAGNOSIS — I10 Essential (primary) hypertension: Secondary | ICD-10-CM | POA: Diagnosis not present

## 2015-03-07 DIAGNOSIS — Z9841 Cataract extraction status, right eye: Secondary | ICD-10-CM | POA: Diagnosis not present

## 2015-03-07 DIAGNOSIS — Z79899 Other long term (current) drug therapy: Secondary | ICD-10-CM

## 2015-03-07 DIAGNOSIS — Z1621 Resistance to vancomycin: Secondary | ICD-10-CM | POA: Diagnosis present

## 2015-03-07 DIAGNOSIS — Z8249 Family history of ischemic heart disease and other diseases of the circulatory system: Secondary | ICD-10-CM | POA: Diagnosis not present

## 2015-03-07 DIAGNOSIS — Z96641 Presence of right artificial hip joint: Secondary | ICD-10-CM | POA: Diagnosis present

## 2015-03-07 DIAGNOSIS — E785 Hyperlipidemia, unspecified: Secondary | ICD-10-CM

## 2015-03-07 DIAGNOSIS — Z86718 Personal history of other venous thrombosis and embolism: Secondary | ICD-10-CM | POA: Diagnosis not present

## 2015-03-07 DIAGNOSIS — N390 Urinary tract infection, site not specified: Secondary | ICD-10-CM | POA: Diagnosis present

## 2015-03-07 DIAGNOSIS — I442 Atrioventricular block, complete: Secondary | ICD-10-CM | POA: Diagnosis present

## 2015-03-07 DIAGNOSIS — Z9842 Cataract extraction status, left eye: Secondary | ICD-10-CM

## 2015-03-07 DIAGNOSIS — Z8673 Personal history of transient ischemic attack (TIA), and cerebral infarction without residual deficits: Secondary | ICD-10-CM

## 2015-03-07 HISTORY — DX: Dorsalgia, unspecified: M54.9

## 2015-03-07 HISTORY — DX: Urinary tract infection, site not specified: N39.0

## 2015-03-07 HISTORY — DX: Iron deficiency anemia, unspecified: D50.9

## 2015-03-07 HISTORY — DX: Other chronic pain: G89.29

## 2015-03-07 LAB — COMPREHENSIVE METABOLIC PANEL
ALK PHOS: 96 U/L (ref 38–126)
ALT: 15 U/L (ref 14–54)
AST: 20 U/L (ref 15–41)
Albumin: 3.4 g/dL — ABNORMAL LOW (ref 3.5–5.0)
Anion gap: 10 (ref 5–15)
BILIRUBIN TOTAL: 0.4 mg/dL (ref 0.3–1.2)
BUN: 13 mg/dL (ref 6–20)
CHLORIDE: 103 mmol/L (ref 101–111)
CO2: 22 mmol/L (ref 22–32)
Calcium: 8.4 mg/dL — ABNORMAL LOW (ref 8.9–10.3)
Creatinine, Ser: 1.02 mg/dL — ABNORMAL HIGH (ref 0.44–1.00)
GFR calc Af Amer: 56 mL/min — ABNORMAL LOW (ref >60)
GFR calc non Af Amer: 49 mL/min — ABNORMAL LOW (ref >60)
Glucose, Bld: 88 mg/dL (ref 65–99)
Potassium: 3.9 mmol/L (ref 3.5–5.1)
SODIUM: 135 mmol/L (ref 135–145)
Total Protein: 6.2 g/dL — ABNORMAL LOW (ref 6.5–8.1)

## 2015-03-07 LAB — CUP PACEART REMOTE DEVICE CHECK: MDC IDC SESS DTM: 20160628111721

## 2015-03-07 LAB — CBC WITH DIFFERENTIAL/PLATELET
BASOS ABS: 0.1 10*3/uL (ref 0.0–0.1)
BASOS PCT: 1 % (ref 0–1)
Eosinophils Absolute: 0.3 10*3/uL (ref 0.0–0.7)
Eosinophils Relative: 4 % (ref 0–5)
HCT: 32.6 % — ABNORMAL LOW (ref 36.0–46.0)
Hemoglobin: 10.7 g/dL — ABNORMAL LOW (ref 12.0–15.0)
Lymphocytes Relative: 17 % (ref 12–46)
Lymphs Abs: 1.2 10*3/uL (ref 0.7–4.0)
MCH: 28.6 pg (ref 26.0–34.0)
MCHC: 32.8 g/dL (ref 30.0–36.0)
MCV: 87.2 fL (ref 78.0–100.0)
Monocytes Absolute: 0.7 10*3/uL (ref 0.1–1.0)
Monocytes Relative: 10 % (ref 3–12)
NEUTROS ABS: 4.6 10*3/uL (ref 1.7–7.7)
NEUTROS PCT: 68 % (ref 43–77)
PLATELETS: 298 10*3/uL (ref 150–400)
RBC: 3.74 MIL/uL — ABNORMAL LOW (ref 3.87–5.11)
RDW: 15.8 % — ABNORMAL HIGH (ref 11.5–15.5)
WBC: 6.7 10*3/uL (ref 4.0–10.5)

## 2015-03-07 LAB — URINE CULTURE: Colony Count: >=100000

## 2015-03-07 MED ORDER — AMLODIPINE BESYLATE 10 MG PO TABS
10.0000 mg | ORAL_TABLET | Freq: Every day | ORAL | Status: DC
  Administered 2015-03-08: 10 mg via ORAL
  Filled 2015-03-07 (×2): qty 1

## 2015-03-07 MED ORDER — APIXABAN 5 MG PO TABS
5.0000 mg | ORAL_TABLET | Freq: Two times a day (BID) | ORAL | Status: DC
Start: 2015-03-07 — End: 2015-03-08
  Administered 2015-03-07 – 2015-03-08 (×3): 5 mg via ORAL
  Filled 2015-03-07 (×4): qty 1

## 2015-03-07 MED ORDER — ACETAMINOPHEN 500 MG PO TABS
500.0000 mg | ORAL_TABLET | Freq: Four times a day (QID) | ORAL | Status: DC
  Administered 2015-03-07 – 2015-03-08 (×3): 500 mg via ORAL
  Filled 2015-03-07 (×8): qty 1

## 2015-03-07 MED ORDER — ACETAMINOPHEN 500 MG PO TABS: 500.0000 mg | ORAL_TABLET | Freq: Four times a day (QID) | ORAL | Status: DC | PRN

## 2015-03-07 MED ORDER — LINEZOLID 600 MG/300ML IV SOLN
600.0000 mg | Freq: Two times a day (BID) | INTRAVENOUS | Status: DC
Start: 2015-03-07 — End: 2015-03-08
  Administered 2015-03-07 – 2015-03-08 (×3): 600 mg via INTRAVENOUS
  Filled 2015-03-07 (×4): qty 300

## 2015-03-07 MED ORDER — GABAPENTIN 100 MG PO CAPS
100.0000 mg | ORAL_CAPSULE | Freq: Three times a day (TID) | ORAL | Status: DC
  Administered 2015-03-07 – 2015-03-08 (×3): 100 mg via ORAL
  Filled 2015-03-07 (×5): qty 1

## 2015-03-07 MED ORDER — ATORVASTATIN CALCIUM 40 MG PO TABS
40.0000 mg | ORAL_TABLET | Freq: Every day | ORAL | Status: DC
  Administered 2015-03-07: 40 mg via ORAL
  Filled 2015-03-07 (×2): qty 1

## 2015-03-07 MED ORDER — VITAMIN B-12 1000 MCG PO TABS
1000.0000 ug | ORAL_TABLET | Freq: Every day | ORAL | Status: DC
  Administered 2015-03-08: 1000 ug via ORAL
  Filled 2015-03-07 (×2): qty 1

## 2015-03-07 MED ORDER — PANTOPRAZOLE SODIUM 40 MG PO TBEC
40.0000 mg | DELAYED_RELEASE_TABLET | Freq: Every day | ORAL | Status: DC
  Administered 2015-03-08: 40 mg via ORAL
  Filled 2015-03-07: qty 1

## 2015-03-07 NOTE — Assessment & Plan Note (Signed)
VRE UTI -Patient to be directly admitted to the hospital for IV drug therapy and cultures

## 2015-03-07 NOTE — Progress Notes (Signed)
    RE: benefit check  Received: Today    Yvonna Alanisndra M Robinson CMA           03/07/2015 Called [employer] [facility] at [phone]. Talked to CSR Alinda Moneyony D. LINEZOLID is covered as a TIER 4 Medication. PRIOR AUTHORIZATION is Required. Prior Authorization phone line is [phone]. Patient's retail pharmacy co-payment will be $100.00. Medication available at several retail pharmacies.

## 2015-03-07 NOTE — Progress Notes (Signed)
   Subjective:    Patient ID: Victoria Lewis, female    DOB: August 17, 1928, 81 y.o.   MRN: [id number]  HPI 81 -year-old female seen for follow-up of UTI.  Patient seen in office on 03/03/2015 for evaluation of UTI. Urine has subsequently grown vancomycin resistant enterococcus. Per the patient's daughter, the patient has had waxing and waning mental status. She's had decreased by mouth intake as well as decreased interactions. Patient is unable to provide significant past medical history secondary to dementia.   Review of Systems     Objective:   Physical Exam Vitals: Reviewed Gen.: Pleasant demented female, sitting in wheelchair Cardiac: Regular rate and rhythm, S1 and S2 present and murmurs Respiratory: Clear to auscultation bilaterally, normal effort Abdomen: Bowel sounds present, no tenderness  Urine culture obtained from 03/02/2013 grew vancomycin resistant enterococcus. Sensitive to Linezolid.      Assessment & Plan:  Please see problem specific assessment and plan.   After discussion with the family was decided to admit the patient [facility] for [facility] drug therapy and observation overnight.

## 2015-03-07 NOTE — Progress Notes (Addendum)
Family Medicine Teaching [facility] Admission History and Physical Service Pager: [phone]  Patient name: KELTY SZAFRAN Medical record number: [id number] Date of birth: 22-Jun-1928 Age: 81 y.o. Gender: female  Primary Care Provider: Uvaldo Rising, MD Consultants: None  Code Status: Full   Chief Complaint: Enterococcus UTI   Assessment and Plan: HUBERT DERSTINE is a 81 y.o. female presenting with Enterococcus UTI . PMH is significant for HLD, complete heart block, DVT April 2016, HTN, CVA w/ dementia.   #Enteroccous UTI - UCx from 6/24 showing enterococcus susceptible to Linezolid but resistant to others. No evidence of urosepsis or pyelo.  - Admit to telemetry with underlying complete heart block documentation (previous loop recorder 01/30/15 with no evidence of events), vitals per unit - Start IV linezolid 600 mg BID.  Transition to 600 mg BID PO in 1-2 days with consult to CM for medication help - Obtain CMET, CBC w/ diff, and BCx - Diet as tolerates   #HTN - Stable, continue home medications   #HLD - Stable, continue home meds  # Hx of DVT - Continue home Eliquis 5 mg BID   FEN/GI: Regular Diet  Prophylaxis: Eliquis 5 mg BID   Disposition: Telemetry pending further w/u   History of Present Illness: CORALIE STANKE is a 81 y.o. female presenting with recurrent UTI, found to have enterococcus UTI.  Pt seen in clinic on 6/24 for dysuria and foul smelling urine.  She had cath specimen obtained which showed enterococcus sensitive to linezolid.  She was given script for cipro but did not fill this.  Since 6/24, she has done well, "but has not felt well".  Denies fever, chills, or sweats, abdominal pain, flank pain, suprapubic pain.    Review Of Systems: Per HPI   Patient Active Problem List   Diagnosis Date Noted  . Intertrigo 01/24/2015  . Skin lesion of face 01/24/2015  . DVT (deep venous thrombosis) 12/27/2014  . Cough 12/27/2014  . Stroke with cerebral  ischemia   . HLD (hyperlipidemia)   . Essential hypertension   . Focal motor deficit 12/19/2014  . Low back pain radiating to left leg 11/01/2014  . Complete heart block 07/14/2014  . Dementia   . Chronic kidney disease (CKD), stage III (moderate) 04/11/2014  . History of urinary anomaly 03/04/2014  . Recurrent UTI 02/28/2014   Past Medical History: Past Medical History  Diagnosis Date  . Hypertension   . GERD (gastroesophageal reflux disease)   . H/O hiatal hernia   . Arthritis   . Anemia     iron  . Syncope 07/13/2014  . UTI (lower urinary tract infection)   . Chronic kidney disease (CKD), stage III (moderate)     [name] 08/30/2014  . Bladder infection, chronic     [name] 08/30/2014  . Dementia     [name] 08/30/2014  . AV block, complete   . Chest pain   . Decreased dorsalis pedis pulse   . Hammer toe   . Hyperlipidemia   . Anemia   . Onychogryposis of toenail   . Osteoporosis   . Scoliosis   . Status post placement of implantable loop recorder   . Syncope   . Uterine disorder   . Diaphragmatic hernia   . Hx of epistaxis   . Peptic ulcer   . Chest pain    Past Surgical History: Past Surgical History  Procedure Laterality Date  . Joint replacement Left 2008  . Eye surgery Bilateral  cataracts   . Total hip arthroplasty Right 10/11/2013    Procedure: TOTAL HIP ARTHROPLASTY;  Surgeon: Nestor LewandowskyFrank J Rowan, MD;  Location: [facility] OR;  Service: Orthopedics;  Laterality: Right;  . Loop recorder implant      [name]/notes 08/30/2014  . Tubal ligation      [name]/notes 01/22/2011  . Total hip arthroplasty  05/2006    [name]/notes 01/22/2011   Social History: History  Substance Use Topics  . Smoking status: Never Smoker   . Smokeless tobacco: Never Used  . Alcohol Use: No    Family History: Family History  Problem Relation Age of Onset  . Stroke Mother [id number]    died of stroke  . Hypertension Mother   . Heart disease Mother    Allergies and Medications: No Known Allergies No current  facility-administered medications on file prior to encounter.   Current Outpatient Prescriptions on File Prior to Encounter  Medication Sig Dispense Refill  . acetaminophen (TYLENOL) 500 MG tablet Take 1 tablet (500 mg total) by mouth every 6 (six) hours as needed. 30 tablet 0  . aluminum hydroxide-magnesium carbonate (GAVISCON) 95-358 MG/15ML SUSP Take 30 mLs by mouth as needed for indigestion or heartburn.     Marland Kitchen. amLODipine (NORVASC) 10 MG tablet TAKE 1 TABLET (10 MG TOTAL) BY MOUTH DAILY. 30 tablet 10  . atorvastatin (LIPITOR) 40 MG tablet TAKE 1 TABLET (40 MG TOTAL) BY MOUTH DAILY AT 6 PM. 90 tablet 1  . carbamide peroxide (DEBROX) 6.5 % otic solution Place 5 drops into both ears 2 (two) times daily. 15 mL 1  . cholecalciferol (VITAMIN D) 1000 UNITS tablet Take 1,000 Units by mouth daily.    . ciprofloxacin (CIPRO) 500 MG tablet Take 1 tablet (500 mg total) by mouth 2 (two) times daily. 20 tablet 0  . CRANBERRY PO Take 240 mLs by mouth 3 (three) times daily. Cranberry concentrate solution    . D-MANNOSE PO Take 1 tablet by mouth 2 (two) times daily.    Marland Kitchen. ELIQUIS 5 MG TABS tablet TAKE 2 TABLETS (10MG ) TWICE DAILY UNTIL 12/29/14 IN PM,THEN DECREASE TO 1 TABLET (5MG ) TWICE A DAY 60 tablet 1  . fluticasone (FLONASE) 50 MCG/ACT nasal spray Place 2 sprays into both nostrils daily. (Patient not taking: Reported on 12/27/2014) 16 g 6  . gabapentin (NEURONTIN) 100 MG capsule Take 1 capsule by mouth 3 (three) times daily.  2  . nystatin (MYCOSTATIN/NYSTOP) 100000 UNIT/GM POWD Apply to affected area twice daily. 60 g 1  . omeprazole (PRILOSEC) 20 MG capsule Take 1 capsule (20 mg total) by mouth daily. 90 capsule 1  . PREMARIN vaginal cream Place 1 Applicatorful vaginally daily. 42.5 g 2  . Probiotic Product (ALIGN) 4 MG CAPS Take 4 mg by mouth daily.    . vitamin B-12 (CYANOCOBALAMIN) 1000 MCG tablet Take 1,000 mcg by mouth daily.    . Wheat Dextrin (BENEFIBER) POWD Take 1 Dose by mouth daily.       Objective: There were no vitals taken for this visit. Exam: General: NAD, comfortable in wheelchair Eyes: PERRLA  ENTM: MMM Neck: No JVD  Cardiovascular: RRR Respiratory: CTAB Abdomen: Soft/NT/ND, No CVA TTP  Neuro: CN 2-12 intact  Psych: AAO x 3   Labs and Imaging: CBC BMET  No results for input(s): WBC, HGB, HCT, PLT in the last 168 hours. No results for input(s): NA, K, CL, CO2, BUN, CREATININE, GLUCOSE, CALCIUM in the last 168 hours.   UCx 6/24 Culture VANCOMYCIN RESISTANT ENTEROCOCCUS  ISOLATEDP   Colony Count >=100,000 COLONIES/MLP          Briscoe Deutscher, DO 03/07/2015, 10:25 AM PGY-3, [facility] FPTS Intern pager: [phone], text pages welcome

## 2015-03-07 NOTE — Progress Notes (Signed)
Benefit check in progress for linezolid 600 mg BID; Alexis GoodellB Mohmed Farver RN,BSN,MHA [phone]

## 2015-03-07 NOTE — Progress Notes (Signed)
Family Medicine Teaching [facility] Admission History and Physical Service Pager: [phone]  [name] Medical record number: [id number] Date of birth: 1928-01-08 Age: 81 y.o. Gender: female  Primary Care Provider: Uvaldo Rising, MD Consultants: None  Code Status: Full   Chief Complaint: Enterococcus UTI   Assessment and Plan: Victoria Lewis is a 80 y.o. female presenting with Enterococcus UTI . PMH is significant for HLD, complete heart block, DVT April 2016, HTN, CVA w/ dementia.   #Enteroccous UTI - UCx from 6/24 showing enterococcus susceptible to Linezolid but resistant to others. No evidence of urosepsis or pyelo.  - Admit to telemetry with underlying complete heart block, vitals per unit - Start IV linezolid 600 mg BID.  Transition to 600 mg BID PO in 1-2 days  - Obtain CMET, CBC w/ diff, and BCx - Diet as tolerates   #HTN - Stable, continue home medications   #HLD - Stable, continue home meds  # Hx of DVT  FEN/GI: Regular Diet  Prophylaxis: Lovenox SQ   Disposition: Telemetry pending further w/u   History of Present Illness: Victoria Lewis is a 81 y.o. female presenting with recurrent UTI, found to have enterococcus UTI.  Pt seen in clinic on 6/24 for dysuria and foul smelling urine.  She had cath specimen obtained which showed enterococcus sensitive to linezolid.  She was given script for cipro but did not fill this.  Since 6/24, she has done well, "but has not felt well".  Denies fever, chills, or sweats, abdominal pain, flank pain, suprapubic pain.    Review Of Systems: Per HPI   Patient Active Problem List   Diagnosis Date Noted  . Intertrigo 01/24/2015  . Skin lesion of face 01/24/2015  . DVT (deep venous thrombosis) 12/27/2014  . Cough 12/27/2014  . Stroke with cerebral ischemia   . HLD (hyperlipidemia)   . Essential hypertension   . Focal motor deficit 12/19/2014  . Low back pain radiating to left leg 11/01/2014  .  Complete heart block 07/14/2014  . Dementia   . Chronic kidney disease (CKD), stage III (moderate) 04/11/2014  . History of urinary anomaly 03/04/2014  . Recurrent UTI 02/28/2014   Past Medical History: Past Medical History  Diagnosis Date  . Hypertension   . GERD (gastroesophageal reflux disease)   . H/O hiatal hernia   . Arthritis   . Anemia     iron  . Syncope 07/13/2014  . UTI (lower urinary tract infection)   . Chronic kidney disease (CKD), stage III (moderate)     Victoria Lewis 08/30/2014  . Bladder infection, chronic     Victoria Lewis 08/30/2014  . Dementia     Victoria Lewis 08/30/2014  . AV block, complete   . Chest pain   . Decreased dorsalis pedis pulse   . Hammer toe   . Hyperlipidemia   . Anemia   . Onychogryposis of toenail   . Osteoporosis   . Scoliosis   . Status post placement of implantable loop recorder   . Syncope   . Uterine disorder   . Diaphragmatic hernia   . Hx of epistaxis   . Peptic ulcer   . Chest pain    Past Surgical History: Past Surgical History  Procedure Laterality Date  . Joint replacement Left 2008  . Eye surgery Bilateral     cataracts   . Total hip arthroplasty Right 10/11/2013    Procedure: TOTAL HIP ARTHROPLASTY;  Surgeon: Nestor Lewandowsky, MD;  Location:  [facility] OR;  Service: Orthopedics;  Laterality: Right;  . Loop recorder implant      Victoria Lewis/notes 08/30/2014  . Tubal ligation      Victoria Lewis/notes 01/22/2011  . Total hip arthroplasty  05/2006    Victoria Lewis/notes 01/22/2011   Social History: History  Substance Use Topics  . Smoking status: Never Smoker   . Smokeless tobacco: Never Used  . Alcohol Use: No    Family History: Family History  Problem Relation Age of Onset  . Stroke Mother 467    died of stroke  . Hypertension Mother   . Heart disease Mother    Allergies and Medications: No Known Allergies Current Outpatient Prescriptions on File Prior to Visit  Medication Sig Dispense Refill  . acetaminophen (TYLENOL) 500 MG tablet Take 1 tablet (500 mg total) by  mouth every 6 (six) hours as needed. 30 tablet 0  . aluminum hydroxide-magnesium carbonate (GAVISCON) 95-358 MG/15ML SUSP Take 30 mLs by mouth as needed for indigestion or heartburn.     Marland Kitchen. amLODipine (NORVASC) 10 MG tablet TAKE 1 TABLET (10 MG TOTAL) BY MOUTH DAILY. 30 tablet 10  . atorvastatin (LIPITOR) 40 MG tablet TAKE 1 TABLET (40 MG TOTAL) BY MOUTH DAILY AT 6 PM. 90 tablet 1  . carbamide peroxide (DEBROX) 6.5 % otic solution Place 5 drops into both ears 2 (two) times daily. 15 mL 1  . cholecalciferol (VITAMIN D) 1000 UNITS tablet Take 1,000 Units by mouth daily.    . ciprofloxacin (CIPRO) 500 MG tablet Take 1 tablet (500 mg total) by mouth 2 (two) times daily. 20 tablet 0  . CRANBERRY PO Take 240 mLs by mouth 3 (three) times daily. Cranberry concentrate solution    . D-MANNOSE PO Take 1 tablet by mouth 2 (two) times daily.    Marland Kitchen. ELIQUIS 5 MG TABS tablet TAKE 2 TABLETS (10MG ) TWICE DAILY UNTIL 12/29/14 IN PM,THEN DECREASE TO 1 TABLET (5MG ) TWICE A DAY 60 tablet 1  . fluticasone (FLONASE) 50 MCG/ACT nasal spray Place 2 sprays into both nostrils daily. (Patient not taking: Reported on 12/27/2014) 16 g 6  . gabapentin (NEURONTIN) 100 MG capsule Take 1 capsule by mouth 3 (three) times daily.  2  . nystatin (MYCOSTATIN/NYSTOP) 100000 UNIT/GM POWD Apply to affected area twice daily. 60 g 1  . omeprazole (PRILOSEC) 20 MG capsule Take 1 capsule (20 mg total) by mouth daily. 90 capsule 1  . PREMARIN vaginal cream Place 1 Applicatorful vaginally daily. 42.5 g 2  . Probiotic Product (ALIGN) 4 MG CAPS Take 4 mg by mouth daily.    . vitamin B-12 (CYANOCOBALAMIN) 1000 MCG tablet Take 1,000 mcg by mouth daily.    . Wheat Dextrin (BENEFIBER) POWD Take 1 Dose by mouth daily.     No current facility-administered medications on file prior to visit.    Objective: BP 135/63 mmHg  Pulse 85  Temp(Src) 97.9 F (36.6 C) (Oral)  Wt  Exam: General: NAD, comfortable in wheelchair Eyes: PERRLA  ENTM: MMM Neck:  No JVD  Cardiovascular: RRR Respiratory: CTAB Abdomen: Soft/NT/ND, No CVA TTP  Neuro: CN 2-12 intact  Psych: AAO x 3   Labs and Imaging: CBC BMET  No results for input(s): WBC, HGB, HCT, PLT in the last 168 hours. No results for input(s): NA, K, CL, CO2, BUN, CREATININE, GLUCOSE, CALCIUM in the last 168 hours.   UCx 6/24 Culture VANCOMYCIN RESISTANT ENTEROCOCCUS ISOLATEDP   Colony Count >=100,000 COLONIES/MLP  Briscoe Deutscher, DO 03/07/2015, 10:00 AM PGY-3, [place] [facility] FPTS Intern pager: [phone], text pages welcome

## 2015-03-08 ENCOUNTER — Telehealth: Payer: Self-pay | Admitting: *Deleted

## 2015-03-08 MED ORDER — LINEZOLID 600 MG PO TABS: 600.0000 mg | ORAL_TABLET | Freq: Two times a day (BID) | ORAL | Status: DC

## 2015-03-08 NOTE — Progress Notes (Signed)
Talked to Dr Leonides Schanzorsey, she had difficulty with prior authorization with med[employer] called- medication authorized as requested auth # [id number]. Abelino DerrickB Denyla Cortese RN,BSN,MHA [phone]

## 2015-03-08 NOTE — Telephone Encounter (Signed)
Prior Authorization received from [facility] for Linezolid 600 mg.  PA form placed in provider box for completion. [name], Tamika L, RN

## 2015-03-08 NOTE — Progress Notes (Signed)
Family Medicine Teaching Service Daily Progress Note Intern Pager: [phone]  [name] Medical record number: [id number] Date of birth: 1928-03-31 Age: 81 y.o. Gender: female  Primary Care Provider: Uvaldo RisingFLETKE, KYLE, J, MD Consultants: None Code Status: Full code  Pt Overview and Major Events to Date:  6/28: Direct admit for IV abx due to VRE UTI with somnolence and poor PO intake.  Assessment and Plan: Victoria Lewis is a 81 y.o. female presenting with Enterococcus UTI . PMH is significant for HLD, complete heart block, DVT April 2016, HTN, CVA w/ dementia.   Vancomycin Resistant Enteroccous UTI - UCx from 6/24 showing enterococcus susceptible to Linezolid but resistant to others. No evidence of urosepsis or pyelonephritis currently. Will treat given concerns for symptoms: poor PO intake and somnolent. No leukocytosis noted on initial lab work. Afebrile overnight.  -continue to monitor on telemetry (complete heart block) documentation (previous loop recorder 01/30/15 with no evidence of events), vitals per unit - Continue linezolid 600 mg  IV BID. Transition to 600 mg BID PO today vs tomorrow - consult to CM for medication help (will need PA) - Blood cultures pending (after antibiotics)  HTN - Stable - continue home amlodipine  HLD - Stable - continue home Lipitor   CKD, stage III: Baseline SCr ranges from 1.2-0.9. Stablea t 1.02 today.  - continue to monitor - avoid nephrotoxic agents  Hx of DVT: partially occluding acute deep vein thrombosis involving the right mid femoral vein extending to the calf veins of the right lower extremity noted on 12/2014.  - Continue home Eliquis 5 mg BID   Complete heart block: Had loop recorder placed 08/2014 - continue to monitor on telemetry   Dementia:  - Attempt to orient patient frequently.   - Avoid benzos - Monitor for delirium   FEN/GI: Regular Diet  Prophylaxis: Eliquis 5 mg BID   Disposition: Discharge home  with family: in the past PT has recommended SNF, pt has 24hr supervision and family is adamant about her being home.   Subjective:  Patient denies abdominal pain or dysuria currently. Daughter at bedside feels her mother is back to her baseline  Objective: Temp:  [97.4 F (36.3 C)-98.5 F (36.9 C)] 98.5 F (36.9 C) (06/29 0615) Pulse Rate:  [80-120] 120 (06/29 0615) Resp:  [16-20] 20 (06/29 0615) BP: (135-144)/(63-75) 135/75 mmHg (06/29 0615) SpO2:  [95 %-100 %] 95 % (06/29 0615) Weight:  [133 lb 5.4 oz (60.481 kg)-135 lb 3.2 oz (61.326 kg)] 133 lb 5.4 oz (60.481 kg) (06/29 0615) Physical Exam: General: Lying in bed, alert, in NAD. Non-toxic in appearance.  Cardiovascular: Tachycardic to 100s, regular rhythm. No m/r/g noted. No pitting edema. Respiratory: Non-labored. CTAB without wheezing, rhonchi, or crackles.  Abdomen: +BS. Soft, ND/NT.  Neuro: Alert. Oriented to person, daughter. Not to place, time, or president.   Laboratory:  Recent Labs Lab 03/07/15 1640  WBC 6.7  HGB 10.7*  HCT 32.6*  PLT 298    Recent Labs Lab 03/07/15 1640  NA 135  K 3.9  CL 103  CO2 22  BUN 13  CREATININE 1.02*  CALCIUM 8.4*  PROT 6.2*  BILITOT 0.4  ALKPHOS 96  ALT 15  AST 20  GLUCOSE 88   U/A: Large LE, negative nitrite, rare epi Urine culture: >100,000 cfu vancomycin resistant enterococcus  Blood culture: Pending   Imaging/Diagnostic Tests: None  Joanna Puffrystal S Ira Dougher, MD 03/08/2015, 6:26 AM PGY-1, [place] [facility] FPTS Intern pager: [phone], text  pages welcome

## 2015-03-08 NOTE — Progress Notes (Signed)
Pt has orders to be discharged. Discharge instructions given and pt has no additional questions at this time. Medication regimen reviewed and pt educated. Pt verbalized understanding and has no additional questions. Telemetry box removed. IV removed and site in good condition. Pt stable and waiting for transportation.   Terrall Bley RN 

## 2015-03-08 NOTE — Discharge Summary (Signed)
Family Medicine Teaching [facility]charge Summary  Patient name: Victoria HelperBetty C Lewis Medical record number: [id number] Date of birth: 11-10-1927 Age: 81 y.o. Gender: female Date of Admission: 03/07/2015  Date of Discharge: 03/08/15 Admitting Physician: Uvaldo RisingKyle J Fletke, MD  Primary Care Provider: Uvaldo RisingFLETKE, KYLE, J, MD Consultants: None  Indication for Hospitalization: VRE UTI, AMS  Discharge Diagnoses/Problem List:  Vancomycin resistant enterococcus UTI Hypertension  History of AV block  Dementia Hyperlipidemia   Disposition: Home  Discharge Condition: Improved, stable    Brief Hospital Course:  Victoria Lewis is a 81 y/o with a PMH dementia and recurrent UTIs presenting as a direct admission. The patient's urine culture form 6/24 returned as >100,000 cfu of VRE. Per the family's report, their mother had become more somnolent and had decreased PO intake, which they felt was c/w her other UTIs. No fevers, dysuria, or suprapubic pain (per family she never endorses these). She was started on IV linezolid. Blood cultures were obtained, however after IV abx.  Her mental status improved significantly and she was at her baseline per her daughter on the day of discharge. She was transitioned to PO linezolid and discharged home to her family where she has 24hr supervision and a wheelchair.  Issues for Follow Up:  1. Consider f/u of LFTs and CBC after completion of regimen.  2. Continue to monitor patient's weight: if she drops below 60kg, Victoria Lewis should be decreased down to 2.5mg  daily as she's older than 81 y/o.  Significant Procedures: None   Significant Labs and Imaging:   Recent Labs Lab 03/07/15 1640  WBC 6.7  HGB 10.7*  HCT 32.6*  PLT 298    Recent Labs Lab 03/07/15 1640  NA 135  K 3.9  CL 103  CO2 22  GLUCOSE 88  BUN 13  CREATININE 1.02*  CALCIUM 8.4*  ALKPHOS 96  AST 20  ALT 15  ALBUMIN 3.4*   U/A: Large LE, negative nitrite, rare epi Urine culture:  >100,000 cfu vancomycin resistant enterococcus  Blood culture: NGTD <24hrs   Results/Tests Pending at Time of Discharge: Blood culture  Discharge Medications:    Medication List    TAKE these medications        acetaminophen 500 MG tablet  Commonly known as:  TYLENOL  Take 1 tablet (500 mg total) by mouth every 6 (six) hours as needed.     ALIGN 4 MG Caps  Take 4 mg by mouth daily.     aluminum hydroxide-magnesium carbonate 95-358 MG/15ML Susp  Commonly known as:  GAVISCON  Take 30 mLs by mouth as needed for indigestion or heartburn.     amLODipine 10 MG tablet  Commonly known as:  NORVASC  TAKE 1 TABLET (10 MG TOTAL) BY MOUTH DAILY.     atorvastatin 40 MG tablet  Commonly known as:  LIPITOR  TAKE 1 TABLET (40 MG TOTAL) BY MOUTH DAILY AT 6 PM.     BENEFIBER Powd  Take 1 Dose by mouth daily.     cholecalciferol 1000 UNITS tablet  Commonly known as:  VITAMIN D  Take 1,000 Units by mouth daily.     CRANBERRY PO  Take 240 mLs by mouth 3 (three) times daily. Cranberry concentrate solution     D-MANNOSE PO  Take 1 tablet by mouth 2 (two) times daily.     ELIQUIS 5 MG Tabs tablet  Generic drug:  apixaban  TAKE 2 TABLETS (10MG ) TWICE DAILY UNTIL 12/29/14 IN PM,THEN DECREASE TO 1 TABLET (5MG ) TWICE  A DAY     fluticasone 50 MCG/ACT nasal spray  Commonly known as:  FLONASE  Place 2 sprays into both nostrils daily.     gabapentin 100 MG capsule  Commonly known as:  NEURONTIN  Take 1 capsule by mouth 3 (three) times daily.     linezolid 600 MG tablet  Commonly known as:  ZYVOX  Take 1 tablet (600 mg total) by mouth 2 (two) times daily. Starting this evening.     nystatin 100000 UNIT/GM Powd  Apply to affected area twice daily.     omeprazole 20 MG capsule  Commonly known as:  PRILOSEC  Take 1 capsule (20 mg total) by mouth daily.     vitamin B-12 1000 MCG tablet  Commonly known as:  CYANOCOBALAMIN  Take 1,000 mcg by mouth daily.        Discharge  Instructions: Please refer to Patient Instructions section of EMR for full details.  Patient was counseled important signs and symptoms that should prompt return to medical care, changes in medications, dietary instructions, activity restrictions, and follow up appointments.   Follow-Up Appointments: Follow-up Information    Follow up with Uvaldo Rising, MD On 03/20/2015.   Specialty:  Family Medicine   Why:  at 10:45am for a hospital follow up   Contact information:   [address] [place] [address] [phone]       Joanna Puff, MD 03/08/2015, 11:08 PM PGY-1, [facility]

## 2015-03-08 NOTE — Progress Notes (Signed)
Patient HR 120's sustained this morning, BP 135/75, family medicine MD paged and aware, no new interventions at this time, patient bedresting comfortably with no cardiac complaints, oxygen sat 95% on RA, no complaints of SOB, patient afebrile, family at bedside, will continue to monitor closely.

## 2015-03-08 NOTE — Discharge Instructions (Signed)
Linezolid tablets What is this medicine? LINEZOLID (li NE zoh lid) is an oxazolidinone antibiotic. It is used to treat certain kinds of bacterial infections. It will not work for colds, flu, or other viral infections. This medicine may be used for other purposes; ask your health care provider or pharmacist if you have questions. COMMON BRAND NAME(S): Zyvox What should I tell my health care provider before I take this medicine? They need to know if you have any of these conditions: -cancer -diabetes -heart disease -high blood pressure -kidney disease -pheochromocytoma -untreated thyroid disease -an unusual or allergic reaction to linezolid, other antibiotics or medicines, foods, dyes, or preservatives  How should I use this medicine? Take this medicine by mouth with a glass of water. Follow the directions on the prescription label. Take with food or on an empty stomach. Take your medicine at regular intervals. Do not take your medicine more often than directed. Take all of your medicine as directed even if you think your are better. Do not skip doses or stop your medicine early. Talk to your pediatrician regarding the use of this medicine in children. While this drug may be prescribed for selected conditions, precautions do apply. Overdosage: If you think you have taken too much of this medicine contact a poison control center or emergency room at once. NOTE: This medicine is only for you. Do not share this medicine with others. What if I miss a dose? If you miss a dose, take it as soon as you can. If it is almost time for your next dose, take only that dose. Do not take double or extra doses. What may interact with this medicine? Do not take this medicine with any of the following medications: -bupropion -certain medicines for depression, anxiety, or psychotic disturbances -doxepin -fluoxetine -furazolidone -green tea -MAOIs like Carbex, Eldepryl, Marplan, Nardil, and  Parnate -milnacipran -procarbazine -rasagiline -selegiline -St. John's wort -tryptophan This medicine may also interact with the following medications: -birth control pills -medicines for allergies or colds like phenylpropanolamine, pseudoephedrine -medicines for blood pressure -stimulant medicines for attention disorders, weight loss, or to stay awake This list may not describe all possible interactions. Give your health care provider a list of all the medicines, herbs, non-prescription drugs, or dietary supplements you use. Also tell them if you smoke, drink alcohol, or use illegal drugs. Some items may interact with your medicine. What should I watch for while using this medicine? Tell your doctor or health care professional if your symptoms do not begin to improve or if you get new symptoms. You will need to be on a special diet while taking this medicine. Ask your doctor or health care professional for a list of foods that you should try to avoid. This includes, but is not limited to, smoked or processed meats, aged cheeses, soy sauce, red wines and beer. Do not treat diarrhea with over-the-counter products. Contact your doctor if you have diarrhea that lasts more than 2 days or if the diarrhea is severe and watery. What side effects may I notice from receiving this medicine? Side effects that you should report to your doctor or health care professional as soon as possible: -allergic reactions like skin rash, itching or hives, swelling of the face, lips, or tongue -breathing problems -burning, numbness, or tingling -changes in vision -confused, restless -discolored, sore mouth -fever -irregular heart beat, blood pressure -seizures -tremor, trouble walking -unusual bleeding or bruising -unusually weak or tired Side effects that usually do not require medical attention (report  to your doctor or health care professional if they continue or are bothersome): -changes in  taste -constipation or diarrhea -dizzy -headache -nausea, vomiting -stomach upset -trouble sleeping -vaginal itch, irritation This list may not describe all possible side effects. Call your doctor for medical advice about side effects. You may report side effects to FDA at 1-800-FDA-1088. Where should I keep my medicine? Keep out of the reach of children. Store at room temperature between 15 and 30 degrees C (59 and 86 degrees F). Keep container tightly closed to protect from light and moisture. Throw away any unused medicine after the expiration date. NOTE: This sheet is a summary. It may not cover all possible information. If you have questions about this medicine, talk to your doctor, pharmacist, or health care provider.  2015, Elsevier/Gold Standard. (2013-04-02 14:02:20)

## 2015-03-08 NOTE — Telephone Encounter (Signed)
Rx was discontinued at that pharmacy. [facility] by Care Management.  Joanna Puffrystal S. Haydan Wedig, MD [facility] Resident  03/08/2015, 8:59 PM

## 2015-03-12 LAB — CULTURE, BLOOD (ROUTINE X 2)
Culture: NO GROWTH
Culture: NO GROWTH

## 2015-03-14 ENCOUNTER — Ambulatory Visit (INDEPENDENT_AMBULATORY_CARE_PROVIDER_SITE_OTHER): Payer: Medicare Other | Admitting: Family Medicine

## 2015-03-14 ENCOUNTER — Encounter: Payer: Medicare Other | Admitting: Family Medicine

## 2015-03-14 VITALS — BP 128/70 | HR 94 | Temp 98.6°F

## 2015-03-14 DIAGNOSIS — R8290 Unspecified abnormal findings in urine: Secondary | ICD-10-CM

## 2015-03-14 NOTE — Progress Notes (Signed)
Subjective: 81 year old female brought in by her daughter [name] for acute nursing visit. Patient seen at the request of nursing staff. Per the patient's daughter her mental status has been waxing and waning over the past few days. She recently completed a course of the Linezolid for enterococcus UTI. Completed course on 03/11/15. The patient did have some mild diarrhea on 03/11/15 which has resolved. Her daughter felt that she was dehydrated secondary to the diarrhea and poor by mouth intake. She has been aggressively rehydrating her over the past few days. She reports significant improvement on 03/13/2015. She was conversant and acting her normal self. Of note patient does have baseline dementia. This morning however the patient had increased fatigue, her head was drooping, and she was not speaking much.  Objective:  Vitals reviewed per nursing note Gen.: Pleasant Caucasian female, sitting in wheelchair, smiling, appears to be acting like normal self, says very few words Cardiac: Regular rate and rhythm, S1 and S2 present, no murmurs, no heaves or thrills Respiratory: Clear to auscultation bilaterally except for mild crackles in the bases which is unchanged from previous exam, normal work of effort Abdomen: Soft, nontender, bowel sounds present  Assessment and plan: 81 year old female brought in for nursing visit secondary to waxing and waning mental status. She recently completed treatment for enterococcus UTI. Patient currently appears to be back to her baseline and is without fever or tachycardia. After discussion with the patient's daughter it was decided to not pursue further treatment or workup. Patient is to return home. Encouraged continued by mouth intake.  Donnella ShamKyle Mickel Schreur M.D.

## 2015-03-14 NOTE — Progress Notes (Signed)
   Pt's daughter brought patient into clinic today with complaints of nausea, very strong urine odor and aspiration.  Pt was recently treated for UTI and took last dose of antibotic on Saturday 03/11/15.  Daughter stated that patient's urine smelled just like ammonia.  Pt asked to brought into clinic today.  Will forward to Dr. Randolm IdolFletke that assessed patient.  [name], Tamika L, RN

## 2015-03-16 ENCOUNTER — Ambulatory Visit (INDEPENDENT_AMBULATORY_CARE_PROVIDER_SITE_OTHER): Payer: Medicare Other | Admitting: Family Medicine

## 2015-03-16 ENCOUNTER — Encounter: Payer: Self-pay | Admitting: Family Medicine

## 2015-03-16 VITALS — BP 125/67 | HR 113 | Temp 98.7°F

## 2015-03-16 DIAGNOSIS — N390 Urinary tract infection, site not specified: Secondary | ICD-10-CM

## 2015-03-16 LAB — CBC
HEMATOCRIT: 36.5 % (ref 36.0–46.0)
Hemoglobin: 11.9 g/dL — ABNORMAL LOW (ref 12.0–15.0)
MCH: 28.3 pg (ref 26.0–34.0)
MCHC: 32.6 g/dL (ref 30.0–36.0)
MCV: 86.7 fL (ref 78.0–100.0)
MPV: 8.9 fL (ref 8.6–12.4)
Platelets: 347 10*3/uL (ref 150–400)
RBC: 4.21 MIL/uL (ref 3.87–5.11)
RDW: 16.1 % — AB (ref 11.5–15.5)
WBC: 7.3 10*3/uL (ref 4.0–10.5)

## 2015-03-16 NOTE — Progress Notes (Signed)
Patient ID: [name], female   DOB: 12-25-1927, 81 y.o.   MRN: [id number]   [facility] Yolande Jollyaleb G Victoria Couts, MD Phone: [phone]  Subjective:   # Recurrent UTI - Pt. Here with history of recurrent UTI's. She has dementia, and has had UTI's in the past that are multidrug resistant.  - She is minimally verbal and unable to communicate her symptoms at baseline. She is cared for by her daughters.  - Previously she has had altered mental status changes that indicate her urinary tract infections.  - This am, she was drowsy and hard to arouse as well as not sitting up as well on her own so her daughter brought her in for evaluation over concern for UTI.  - She has not had fever, nausea, vomiting, she has not communitcated pain, she is incontinent at baseline but has not had increase in incontinence, her urine is clear per the daughter but does have a mild "ammonia" odor to it.  - Upon coming in to the office her mental status had improved significantly and she was trying to read magazines and she was somewhat communicative with her daughter.  - She denied abdominal pain.  - No discharge per the daughter.   All relevant systems were reviewed and were negative unless otherwise noted in the HPI  Past Medical History Reviewed problem list.  Medications- reviewed and updated Current Outpatient Prescriptions  Medication Sig Dispense Refill  . acetaminophen (TYLENOL) 500 MG tablet Take 1 tablet (500 mg total) by mouth every 6 (six) hours as needed. (Patient taking differently: Take 500 mg by mouth 4 (four) times daily. ) 30 tablet 0  . aluminum hydroxide-magnesium carbonate (GAVISCON) 95-358 MG/15ML SUSP Take 30 mLs by mouth as needed for indigestion or heartburn.     Marland Kitchen. amLODipine (NORVASC) 10 MG tablet TAKE 1 TABLET (10 MG TOTAL) BY MOUTH DAILY. 30 tablet 10  . atorvastatin (LIPITOR) 40 MG tablet TAKE 1 TABLET (40 MG TOTAL) BY MOUTH DAILY AT 6 PM. 90 tablet 1  .  cholecalciferol (VITAMIN D) 1000 UNITS tablet Take 1,000 Units by mouth daily.    Marland Kitchen. CRANBERRY PO Take 240 mLs by mouth 3 (three) times daily. Cranberry concentrate solution    . D-MANNOSE PO Take 1 tablet by mouth 2 (two) times daily.    Marland Kitchen. ELIQUIS 5 MG TABS tablet TAKE 2 TABLETS (10MG ) TWICE DAILY UNTIL 12/29/14 IN PM,THEN DECREASE TO 1 TABLET (5MG ) TWICE A DAY (Patient taking differently: TAKE 5 mg TWICE DAILY UNTIL 12/29/14 IN PM,THEN DECREASE TO 1 TABLET (5MG ) TWICE A DAY) 60 tablet 1  . fluticasone (FLONASE) 50 MCG/ACT nasal spray Place 2 sprays into both nostrils daily. (Patient taking differently: Place 2 sprays into both nostrils daily as needed for allergies. ) 16 g 6  . gabapentin (NEURONTIN) 100 MG capsule Take 1 capsule by mouth 3 (three) times daily.  2  . linezolid (ZYVOX) 600 MG tablet Take 1 tablet (600 mg total) by mouth 2 (two) times daily. Starting this evening. 5 tablet 0  . nystatin (MYCOSTATIN/NYSTOP) 100000 UNIT/GM POWD Apply to affected area twice daily. 60 g 1  . omeprazole (PRILOSEC) 20 MG capsule Take 1 capsule (20 mg total) by mouth daily. 90 capsule 1  . Probiotic Product (ALIGN) 4 MG CAPS Take 4 mg by mouth daily.    . vitamin B-12 (CYANOCOBALAMIN) 1000 MCG tablet Take 1,000 mcg by mouth daily.    . Wheat Dextrin (BENEFIBER) POWD Take 1 Dose  by mouth daily.     No current facility-administered medications for this visit.   Chief complaint-noted No additions to family history Social history- patient is a non smoker  Objective: BP 125/67 mmHg  Pulse 113  Temp(Src) 98.7 F (37.1 C) (Oral)  Wt  Gen: NAD, alert, cooperative with exam, Minimally communicative at baseline, sitting up and smiling in chair.  HEENT: NCAT, EOMI, PERRL Neck: FROM, supple, no LAD CV: Tachycardic RR, good S1/S2, no murmur Resp: Slight crackles in the right base, but clear otherwise, no wheezes, non-labored Abd: SNTND, BS present, no guarding or organomegaly, no suprapubic tenderness.  Ext:  No edema, warm, normal tone, moves UE/LE spontaneously Neuro: Alert and oriented, No gross deficits Skin: no rashes no lesions  Assessment/Plan:   # Here for SDA for UTI. - Does not meet McGeer's Criteria for tx of UTI in geriatric patients. She is afebrile and asymptomatic at this time. Her mental status is at baseline today. Mildly tachycardic but baseline heart rate in the upper 90's.  - Will hold off on tx with abx at this time given that she is not having any symptoms.  - Will get CBC given that she is minimally communicative. Would consider empiric tx if markedly elevated WBC.  - Hold off on U/A - Follow up with Dr. Randolm Idol on 7/11.  - Return precautions given.

## 2015-03-16 NOTE — Patient Instructions (Signed)
Thanks for coming in today.   We'll get a CBC to look for infection, but otherwise will hold off on further intervention at this time.   Follow up with Dr. Randolm IdolFletke next week at your appointment on 7/11.   If she develops fever, nausea, vomiting, or further symptoms, then please return for evaluation, or to the ED.   Thanks for letting us take care of you!  Sincerely,  Devota Pacealeb Heather Streeper, MD Family Medicine - PGY 2

## 2015-03-20 ENCOUNTER — Telehealth: Payer: Self-pay | Admitting: Family Medicine

## 2015-03-20 ENCOUNTER — Ambulatory Visit (INDEPENDENT_AMBULATORY_CARE_PROVIDER_SITE_OTHER): Payer: Medicare Other | Admitting: Family Medicine

## 2015-03-20 ENCOUNTER — Encounter: Payer: Self-pay | Admitting: Family Medicine

## 2015-03-20 VITALS — BP 126/65 | HR 92 | Temp 98.1°F

## 2015-03-20 DIAGNOSIS — F0390 Unspecified dementia without behavioral disturbance: Secondary | ICD-10-CM | POA: Diagnosis not present

## 2015-03-20 NOTE — Progress Notes (Signed)
   Subjective:    Patient ID: Victoria Lewis, female    DOB: 10-29-1927, 81 y.o.   MRN: [id number]  HPI 81 y/o female presents for follow up of waxing/waning mental status. Accompanied by her husband Lorin PicketScott and daughter Harriett Sineancy. Patient unable to provide medical history however responds to basic questions.   The patient was seen late last week for waxing/waning mental status. The family was concerned for possible UTI as she was recently treated for enterococcus UTI. Patient appeared well and was asymptomatic at that time.   Per her family the patient did well over the weekend, tolerating diet, drinking upwards of 56-64 oz of fluid per day, no associated nausea/emesis/abdominal pain/dysuria. The patient is chronically incontinent of urine. The patient did have some increased somnolence and decreased speech this AM, however has since returned to her baseline.   Review of Systems See above.    Objective:   Physical Exam Vitals: reviewed, no fever Gen: pleasant demented female, NAD, smiling during exam Cardiac: RRR, S1 and S2 present, no murmur, no heaves/thrills Resp: mild crackles at bases, normal effort Abd: soft, no tenderness MSK: sitting in wheelchair   CBC completed last week showed to evidence of infection.     Assessment & Plan:  Please see problem specific assessment and plan.

## 2015-03-20 NOTE — Assessment & Plan Note (Signed)
Patient has waxing/waning mental status that is clinically due to her dementia. No current evidence to suspect UTI is causing her symptoms.  -continue to monitor clinically -return precautions given.

## 2015-03-20 NOTE — Telephone Encounter (Signed)
Called to inform pt. Of normal CBC. Spoke with daughter Harriett Sineancy. They have a follow up appointment today with Dr. Randolm IdolFletke.   CGM MD

## 2015-03-20 NOTE — Patient Instructions (Signed)
It was nice to see you today.  No changes in medications today.  Please keep your appointment on the 25th of July.

## 2015-03-23 ENCOUNTER — Ambulatory Visit (INDEPENDENT_AMBULATORY_CARE_PROVIDER_SITE_OTHER): Payer: Medicare Other | Admitting: Neurology

## 2015-03-23 ENCOUNTER — Encounter: Payer: Self-pay | Admitting: Neurology

## 2015-03-23 VITALS — BP 122/72 | HR 97 | Ht 64.0 in | Wt 135.0 lb

## 2015-03-23 DIAGNOSIS — I82401 Acute embolism and thrombosis of unspecified deep veins of right lower extremity: Secondary | ICD-10-CM

## 2015-03-23 DIAGNOSIS — E785 Hyperlipidemia, unspecified: Secondary | ICD-10-CM

## 2015-03-23 DIAGNOSIS — I749 Embolism and thrombosis of unspecified artery: Secondary | ICD-10-CM

## 2015-03-23 DIAGNOSIS — I63411 Cerebral infarction due to embolism of right middle cerebral artery: Secondary | ICD-10-CM | POA: Insufficient documentation

## 2015-03-23 DIAGNOSIS — F0391 Unspecified dementia with behavioral disturbance: Secondary | ICD-10-CM | POA: Diagnosis not present

## 2015-03-23 DIAGNOSIS — Q211 Atrial septal defect: Secondary | ICD-10-CM | POA: Diagnosis not present

## 2015-03-23 DIAGNOSIS — Q2112 Patent foramen ovale: Secondary | ICD-10-CM | POA: Insufficient documentation

## 2015-03-23 NOTE — Patient Instructions (Addendum)
-   continue eliquis for DVT treatment. We would like to have another 3 months to finish off the course - continue lipitor for stroke prevention. - Follow up with your primary care physician for stroke risk factor modification. Recommend maintain blood pressure goal <130/80, diabetes with hemoglobin A1c goal below 6.5% and lipids with LDL cholesterol goal below 70 mg/dL.  - Check BP at home - follow up in 3 months

## 2015-03-23 NOTE — Progress Notes (Signed)
STROKE NEUROLOGY FOLLOW UP NOTE  NAME: Victoria Lewis DOB: 02-Dec-1927  REASON FOR VISIT: stroke follow up HISTORY FROM: grandson and chart  Today we had the pleasure of seeing Victoria Lewis in follow-up at our Neurology Clinic. Pt was accompanied by grandson and husband.   History Summary Victoria Lewis is a 81 y.o. female with history of HTN, HLD, CKD stage III, dementia, syncope, complete AV block, s/p loop recorder implantation was admitted on 12/19/14 for left face weakness, dysarthria, left hand weakness. MRI showed right MCA cortical punctate infarcts with chronic b/l BG infarcts. Her infarct felt to be emboli, loop recorder was interrogated and no afib episode found. However, RLE was positive for DVT. 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 She was put on Eliquis. LDL 221 and was put on lipitor. She was later discharged home with home health.   Interval History During the interval time, the patient has been doing well from stroke standpoint. No recurrent symptoms. Her left hand dexterity difficulty has been resolved. She continued on eliquis and lipitor without side effect. She is wheelchair bound at baseline. Recently she has been following up with PCP for recurrent UTI and dementia. BP today 122/72.    REVIEW OF SYSTEMS: Full 14 system review of systems performed and notable only for those listed below and in HPI above, all others are negative:  Constitutional:   Cardiovascular:  Ear/Nose/Throat:   Skin:  Eyes:   Respiratory:   Gastroitestinal:   Genitourinary:  Hematology/Lymphatic:   Endocrine:  Musculoskeletal:   Allergy/Immunology:   Neurological:   Psychiatric:  Sleep:   The following represents the patient's updated allergies and side effects list: No Known Allergies  The neurologically relevant items on the patient's problem list were reviewed on today's visit.  Neurologic Examination  A  problem focused neurological exam (12 or more points of the single system neurologic examination, vital signs counts as 1 point, cranial nerves count for 8 points) was performed.  Blood pressure 122/72, pulse 97, height 5\' 4"  (1.626 m), weight 135 lb (61.236 kg).  General - Well nourished, well developed, in no apparent distress.  Ophthalmologic - not cooperative on exam.  Cardiovascular - Regular rate and rhythm.  Mental Status -  Awake, alert, orientated to self, place and people, but not to time, president or situation. Limited language outpt, able to name 2/3 and repeat, follows simple commands but not complex commands.  Cranial Nerves II - XII - II - Visual field intact OU. III, IV, VI - Extraocular movements intact. V - Facial sensation intact bilaterally. VII - Facial movement intact bilaterally. VIII - Hearing & vestibular intact bilaterally. X - Palate elevates symmetrically. XI - Chin turning & shoulder shrug intact bilaterally. XII - Tongue protrusion intact.  Motor Strength - The patient's strength was 4/5 upper extremities and pronator drift was absent. BLEs 3/5 proximal and 3/5 distally. Bulk was normal and fasciculations were absent.  Motor Tone - Muscle tone was assessed at the neck and appendages and was normal.  Reflexes - The patient's reflexes were 1+ in all extremities and she had no pathological reflexes.  Sensory - Light touch, temperature/pinprick were assessed and were symmetrical.   Coordination - The patient had normal movements in the hands with no ataxia or dysmetria. Tremor was absent.  Gait and Station - not tested due to wheelchair bound.  Data reviewed: I personally reviewed the images and agree with  the radiology interpretations.  Ct Head (brain) Wo Contrast 12/19/2014 1. Chronic microvascular white matter disease. No acute intracranial findings identified. 2. Acute on chronic paranasal sinusitis.   MRI HEAD 12/19/2014 At least 2  subcentimeter foci of rete of acute ischemia in RIGHT middle cerebral artery territory. Faint reduced diffusion in RIGHT posterior frontal lobe could reflect subacute infarct or, artifact. Moderate to severe global parenchymal brain volume loss with suspected component of normal pressure hydrocephalus. Severe white matter changes most consistent with chronic small vessel ischemic disease. Remote bilateral basal ganglia and RIGHT greater than LEFT small cerebellar infarcts, advanced from prior imaging. Acute on chronic moderate paranasal sinusitis.   MRA HEAD 12/19/2014 Multifocal mid to high-grade and, high-grade stenosis involving the anterior and posterior circulation without large vessel occlusion. Findings most consistent with intracranial atherosclerosis.   EEG - This is a normal awake electroencephalogram. No epileptiform activity is noted.  2D echo - - Left ventricle: The cavity size was normal. There was mild concentric hypertrophy. Systolic function was normal. Theestimated ejection fraction was in the range of 55% to 60%. Wallmotion was normal; there were no regional wall motionabnormalities. - Mitral valve: Calcified annulus. - Left atrium: The atrium was mildly dilated. - Atrial septum: No defect or patent foramen ovale was identified.  Carotid Doppler Heterogeneous plaque bilaterally. 1-39% stenosis bilaterally. Bilateral vertebral arteries are patent and antegrade.  LE venous doppler Partially occluding acute DVT right mid femoral vein extending into right calf veins.  TCD bubble study - Positive for right to left shunt, indicating small PFO.  Component     Latest Ref Rng 12/19/2014 12/20/2014  Cholesterol     0 - 200 mg/dL  161 (H)  Triglycerides     <150 mg/dL  096 (H)  HDL Cholesterol     >39 mg/dL  44  Total CHOL/HDL Ratio       6.6  VLDL     0 - 40 mg/dL  36  LDL (calc)     0 - 99 mg/dL  045 (H)  Hemoglobin W0J     4.8 - 5.6 % 5.7 (H)   Mean Plasma  Glucose      117     Assessment: As you may recall, she is a 81 y.o. Caucasian female with PMH of HTN, HLD, CKD stage III, dementia, syncope, complete AV block, s/p loop recorder implantation for syncope w/u was admitted on 12/19/14 for right MCA cortical punctate infarcts with chronic b/l BG infarcts. Her infarct felt to be emboli, loop recorder was interrogated and no afib episode found. MRA diffuse athero. However, RLE was positive for DVT. 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 She was put on Eliquis. LDL 221 and was put on lipitor. During the interval time, she is tolerating meds well. Following up with PCP for recurrent UTI and dementia.   Plan:  - continue eliquis for DVT treatment, total 6 month course. Will likely take off next visit. - continue lipitor for stroke prevention. - Follow up with your primary care physician for stroke risk factor modification. Recommend maintain blood pressure goal <130/80, diabetes with hemoglobin A1c goal below 6.5% and lipids with LDL cholesterol goal below 70 mg/dL.  - Check BP at home - RTC in 3 months  No orders of the defined types were placed in this encounter.    No orders of the defined types were placed in this encounter.    Patient Instructions  -  continue eliquis for DVT treatment. We would like to have another 3 months to finish off the course - continue lipitor for stroke prevention. - Follow up with your primary care physician for stroke risk factor modification. Recommend maintain blood pressure goal <130/80, diabetes with hemoglobin A1c goal below 6.5% and lipids with LDL cholesterol goal below 70 mg/dL.  - Check BP at home - follow up in 3 months   Marvel PlanJindong Shahiem Bedwell, MD PhD [facility] [address], [address] [place], [place] [address] [phone]

## 2015-03-28 ENCOUNTER — Encounter: Payer: Self-pay | Admitting: Internal Medicine

## 2015-03-31 ENCOUNTER — Ambulatory Visit (INDEPENDENT_AMBULATORY_CARE_PROVIDER_SITE_OTHER): Payer: Medicare Other | Admitting: *Deleted

## 2015-03-31 DIAGNOSIS — R55 Syncope and collapse: Secondary | ICD-10-CM

## 2015-04-03 ENCOUNTER — Encounter: Payer: Self-pay | Admitting: Internal Medicine

## 2015-04-03 ENCOUNTER — Ambulatory Visit (INDEPENDENT_AMBULATORY_CARE_PROVIDER_SITE_OTHER): Payer: Medicare Other | Admitting: Family Medicine

## 2015-04-03 ENCOUNTER — Encounter: Payer: Self-pay | Admitting: Family Medicine

## 2015-04-03 VITALS — BP 129/69 | HR 109 | Temp 98.0°F | Ht 64.0 in

## 2015-04-03 DIAGNOSIS — F0390 Unspecified dementia without behavioral disturbance: Secondary | ICD-10-CM | POA: Diagnosis not present

## 2015-04-03 DIAGNOSIS — I82402 Acute embolism and thrombosis of unspecified deep veins of left lower extremity: Secondary | ICD-10-CM

## 2015-04-03 NOTE — Assessment & Plan Note (Signed)
Stable on Eliquis. -Will complete 6 months of therapy.

## 2015-04-03 NOTE — Assessment & Plan Note (Signed)
Mental status is unchanged. Demential likely vascular in nature however could have component of Alzheimer's. Family not interested in Aricept or Namenda at this time.

## 2015-04-03 NOTE — Progress Notes (Signed)
Subjective:     Patient ID: Victoria Lewis, female   DOB: [birth year], 81 y.o.   MRN: [id number]  HPI  Documentation by Rema Jasmine MS3. Majority of patient history obtained from the patient's daughter and husband.   CHRONIC UTI's / DEMENTIA Victoria Lewis is here for a checkup. She recently had a UTI with VRE (6/28), that was susceptible to linezolid. She was treated as an inpatient and also given a course for home to finish. She no longer is experiencing any symptoms of a UTI. She denies any burning and her daughter states that her mental status is back at baseline. She states that this morning her urine did smell a little bit. They took her temperature at home and she did not have a fever. They state that at a recent neurologist visit she was told that she had had a few strokes that may have gone unnoticed. When asked what type of dementia the patient may have the daughter and husband did not know. Family is not currently interested in Aricept or Namenda.   LEG SWELLING/ DVT Hx  The patient's daughter is also wondering whether or not to take her off the eliquis which she has been on since 5/19. The patient and daughter deny any shortness of breath, new chest pain or new abnormal leg swelling. They deny any bleeding, blood in her stools. No bleeding or side effects of Eliquis.    UPPER AIRWAY SOUNDS  Her daughter did express some concern that she may be getting congested and that she is hearing some coarse sounds when the patient breathes. She denies any fevers, cough, nausea vomiting, shortness of breath, PND.   Review of Systems Pertinent ROS in HPI     Objective:   Physical Exam  Constitutional: No distress.  HENT:  Head: Normocephalic and atraumatic.  Eyes: Conjunctivae are normal. Right eye exhibits no discharge. Left eye exhibits no discharge. No scleral icterus.  Cardiovascular: Normal rate, regular rhythm and normal heart sounds.  Exam reveals no gallop and no friction  rub.   No murmur heard. Pulmonary/Chest: Effort normal and breath sounds normal. No respiratory distress. She has no wheezes.  Fine soft crackles at lung bases.  Abdominal: Soft. Bowel sounds are normal. She exhibits no distension. There is no tenderness. There is no rebound and no guarding.  Neurological: She is alert.  Ox2 Patient had some trouble with the month and year.   Skin: She is not diaphoretic.       Assessment:     Victoria Lewis is an 81 y.o female here for a wellness check/ fu. Her family denies any new onset AMS and she recovered from her previous VRE UTI. Patient is afebrile and her mental status is back at baseline.    Plan:     DEMENTIA/UTI: no current concerns about new onset UTI, patient is afebrile, denies any burning with urination and her mental status is back at baseline according to her husband and daughter. - Follow up in 2 months or if patient has any new symptoms of UTI  DVT: Patient has been on Eliquis since 01/26/2015, and will need to remain on the medication for 6 months. - Informed patient's caretakers that if she begins to bleed from anywhere to take her to the ER if needed and to inform us. -Follow up in 2 months.   COARSE AIRWAY SOUNDS: lungs clear to auscultation patient is afebrile and she denies any recent fevers cough or shortness of breath. Sounds are most  likely upper airway. - Informed patient's caretakers to keep an eye on her symptoms.    Rema Jasmine MS3  I personally saw and evaluated the patient. I have reviewed the medical student note and agree with the documentation. I personally completed the physical exam. Additions to the medical student note are made in blue.   Donnella Sham MD

## 2015-04-03 NOTE — Patient Instructions (Signed)
Thank you for coming in today!  Come back and see [place] in 2 months  We will keep her ion the eliquis for a total of 6 months. If she has any blood in her stools, urine or bleeds from anywhere else please let us know and if it is an emergency take her to the ER.  Urinary Tract Infection Urinary tract infections (UTIs) can develop anywhere along your urinary tract. Your urinary tract is your body's drainage system for removing wastes and extra water. Your urinary tract includes two kidneys, two ureters, a bladder, and a urethra. Your kidneys are a pair of bean-shaped organs. Each kidney is about the size of your fist. They are located below your ribs, one on each side of your spine. CAUSES Infections are caused by microbes, which are microscopic organisms, including fungi, viruses, and bacteria. These organisms are so small that they can only be seen through a microscope. Bacteria are the microbes that most commonly cause UTIs. SYMPTOMS  Symptoms of UTIs may vary by age and gender of the patient and by the location of the infection. Symptoms in young women typically include a frequent and intense urge to urinate and a painful, burning feeling in the bladder or urethra during urination. Older women and men are more likely to be tired, shaky, and weak and have muscle aches and abdominal pain. A fever may mean the infection is in your kidneys. Other symptoms of a kidney infection include pain in your back or sides below the ribs, nausea, and vomiting. DIAGNOSIS To diagnose a UTI, your caregiver will ask you about your symptoms. Your caregiver also will ask to provide a urine sample. The urine sample will be tested for bacteria and white blood cells. White blood cells are made by your body to help fight infection. TREATMENT  Typically, UTIs can be treated with medication. Because most UTIs are caused by a bacterial infection, they usually can be treated with the use of antibiotics. The choice of antibiotic  and length of treatment depend on your symptoms and the type of bacteria causing your infection. HOME CARE INSTRUCTIONS  If you were prescribed antibiotics, take them exactly as your caregiver instructs you. Finish the medication even if you feel better after you have only taken some of the medication.  Drink enough water and fluids to keep your urine clear or pale yellow.  Avoid caffeine, tea, and carbonated beverages. They tend to irritate your bladder.  Empty your bladder often. Avoid holding urine for long periods of time.  Empty your bladder before and after sexual intercourse.  After a bowel movement, women should cleanse from front to back. Use each tissue only once. SEEK MEDICAL CARE IF:   You have back pain.  You develop a fever.  Your symptoms do not begin to resolve within 3 days. SEEK IMMEDIATE MEDICAL CARE IF:   You have severe back pain or lower abdominal pain.  You develop chills.  You have nausea or vomiting.  You have continued burning or discomfort with urination. MAKE SURE YOU:   Understand these instructions.  Will watch your condition.  Will get help right away if you are not doing well or get worse. Document Released: 06/05/2005 Document Revised: 02/25/2012 Document Reviewed: 10/04/2011 [facility] Patient Information 2015 [place], [place]. This information is not intended to replace advice given to you by your health care provider. Make sure you discuss any questions you have with your health care provider.

## 2015-04-10 DIAGNOSIS — N390 Urinary tract infection, site not specified: Secondary | ICD-10-CM

## 2015-04-10 HISTORY — DX: Urinary tract infection, site not specified: N39.0

## 2015-04-11 ENCOUNTER — Other Ambulatory Visit: Payer: Self-pay | Admitting: *Deleted

## 2015-04-11 MED ORDER — APIXABAN 5 MG PO TABS: 5.0000 mg | ORAL_TABLET | Freq: Two times a day (BID) | ORAL | Status: DC

## 2015-04-18 NOTE — Progress Notes (Signed)
Loop recorder 

## 2015-04-23 ENCOUNTER — Telehealth: Payer: Self-pay | Admitting: Family Medicine

## 2015-04-23 NOTE — Telephone Encounter (Signed)
Paged to [facility] Emergency Line by daughter Atlas Kuc) of patient, Victoria Lewis, regarding question of possible UTI. She reports that her mother seems like she may be getting sick again, recently has had some decreased PO, not talking as much, positive for urinary odor, and some loose bowel movements, also occasionally seems to be "uncomfortable, seems like she may be in pain". Chart review and recounted by daughter, she was hospitalized in 02/2015 for VRE UTI, previously she had pan-sensitive E.Coli UTI treated with Keflex initially then given a back-up Cipro Rx. Currently today she is actually doing a little better, some improved PO intake better with lunch, continue to improve PO fluid hydration, BP check at home with 130s/70s. Denies any fevers/chills, diaphoresis, CP, SOB, hematuria, abd / back pain, nausea / vomiting.  Daughter is asking if she should start her on the Cipro  BID as prescribed previously in 02/2015 as a back-up. I advised her that she may try this if she really thinks that this is a UTI. It is possible with her current symptoms and history of recurrent UTIs. I advised that it is difficult to evaluation over the phone, and would not feel comfortable sending a new prescription for antibiotic in. She may continue to observe her with conservative therapy and bring her in to [facility] for follow-up in 24-48 hours, or alternatively may start the Cipro for 1-2 days and then bring her in for follow-up at [facility]. Alternatively, given return criteria for when to go to ED for further immediate work-up if clinical worsening. Understood. Will forward to PCP.  Saralyn Pilar, DO [facility], PGY-3

## 2015-04-24 ENCOUNTER — Encounter: Payer: Self-pay | Admitting: Family Medicine

## 2015-04-24 ENCOUNTER — Ambulatory Visit (INDEPENDENT_AMBULATORY_CARE_PROVIDER_SITE_OTHER): Payer: Medicare Other | Admitting: Family Medicine

## 2015-04-24 VITALS — BP 128/66 | HR 86 | Temp 98.4°F | Wt 135.0 lb

## 2015-04-24 DIAGNOSIS — N390 Urinary tract infection, site not specified: Secondary | ICD-10-CM | POA: Diagnosis not present

## 2015-04-24 DIAGNOSIS — R8290 Unspecified abnormal findings in urine: Secondary | ICD-10-CM | POA: Diagnosis not present

## 2015-04-24 LAB — CBC WITH DIFFERENTIAL/PLATELET
Basophils Absolute: 0.1 10*3/uL (ref 0.0–0.1)
Basophils Relative: 1 % (ref 0–1)
EOS PCT: 4 % (ref 0–5)
Eosinophils Absolute: 0.3 10*3/uL (ref 0.0–0.7)
HCT: 35.2 % — ABNORMAL LOW (ref 36.0–46.0)
Hemoglobin: 11.6 g/dL — ABNORMAL LOW (ref 12.0–15.0)
Lymphocytes Relative: 13 % (ref 12–46)
Lymphs Abs: 0.9 10*3/uL (ref 0.7–4.0)
MCH: 28.6 pg (ref 26.0–34.0)
MCHC: 33 g/dL (ref 30.0–36.0)
MCV: 86.9 fL (ref 78.0–100.0)
MONO ABS: 0.7 10*3/uL (ref 0.1–1.0)
MPV: 9 fL (ref 8.6–12.4)
Monocytes Relative: 11 % (ref 3–12)
Neutro Abs: 4.7 10*3/uL (ref 1.7–7.7)
Neutrophils Relative %: 71 % (ref 43–77)
PLATELETS: 382 10*3/uL (ref 150–400)
RBC: 4.05 MIL/uL (ref 3.87–5.11)
RDW: 15.9 % — AB (ref 11.5–15.5)
WBC: 6.6 10*3/uL (ref 4.0–10.5)

## 2015-04-24 LAB — CUP PACEART REMOTE DEVICE CHECK: Date Time Interrogation Session: 20160815075322

## 2015-04-24 LAB — POCT URINALYSIS DIPSTICK
Bilirubin, UA: NEGATIVE
GLUCOSE UA: NEGATIVE
KETONES UA: NEGATIVE
Nitrite, UA: POSITIVE
PROTEIN UA: 100
Spec Grav, UA: 1.02
Urobilinogen, UA: 0.2
pH, UA: 7

## 2015-04-24 LAB — BASIC METABOLIC PANEL WITH GFR
BUN: 10 mg/dL (ref 7–25)
CHLORIDE: 100 mmol/L (ref 98–110)
CO2: 22 mmol/L (ref 20–31)
Calcium: 9.6 mg/dL (ref 8.6–10.4)
Creat: 1.03 mg/dL — ABNORMAL HIGH (ref 0.60–0.88)
GFR, Est African American: 57 mL/min — ABNORMAL LOW (ref >=60)
GFR, Est Non African American: 50 mL/min — ABNORMAL LOW (ref >=60)
Glucose, Bld: 81 mg/dL (ref 65–99)
POTASSIUM: 4.2 mmol/L (ref 3.5–5.3)
Sodium: 137 mmol/L (ref 135–146)

## 2015-04-24 LAB — POCT UA - MICROSCOPIC ONLY

## 2015-04-24 NOTE — Addendum Note (Signed)
Addended by: Jennette Bill on: 04/24/2015 10:39 AM   Modules accepted: Orders

## 2015-04-24 NOTE — Progress Notes (Signed)
   Subjective:    Patient ID: Victoria Lewis, female    DOB: 23-Jun-1928, 81 y.o.   MRN: [id number]  Seen for Same day visit for   CC: Not feeling well  Her son reports that she complained of not feeling well for the past 2 days associated with one episode of loose stool and dark smelly urine. Today Victoria Lewis denies any complaints. No CP, SOB, fevers, chills, diarrhea, dysuria, frequency, urgency, confusion, cough, sore throat or coryza.  She has history of VRE UTI and dementia.  Has some urinary incontinence at baseline, and denies any worsening of this.  Denies any abdominal pain or suprapubic tenderness.  Has had sick contacts with family member with URI symptoms.   Review of Systems   See HPI for ROS. Objective:  BP 128/66 mmHg  Pulse 86  Temp(Src) 98.4 F (36.9 C) (Oral)  Wt 135 lb (61.236 kg)  General: NAD Cardiac: RRR, normal heart sounds, no murmurs. 2+ radial and PT pulses bilaterally Respiratory: Decreased breath sounds bilaterally with mild crackles, normal effort Abdomen: soft, nontender, nondistended; Bowel sounds present; No CVA tenderness Extremities: mild edema or cyanosis. WWP. Skin: warm and dry, no rashes noted Neuro: alert, no focal deficits; at baseline per family members    Assessment & Plan:  See Problem List Documentation

## 2015-04-24 NOTE — Assessment & Plan Note (Signed)
Pertinent S&O  Bad urine odor with dark urine and not feeling well x 2 days, but feels somewhat better today  Denies dysuria, frequency, urgency; but has incontinence at baseline  History of dementia; currently at baseline  History of VRE UTI Assessment/Plan  Most likely viral cause for 2 days of not feeling well and loose stools leading to dehydration and dark urine / Strong odor.    However, given dementia at baseline and urinary incontinence with history of VRE UTI.  Will collect UA send for urine culture and check CBC & BMP

## 2015-04-24 NOTE — Patient Instructions (Signed)
It was great seeing you today.   1. Her symptoms are most consistent with a viral infection.  However, given your history of UTIs.  We will collect some urine and blood work today for further evaluation.  2. Continue to drink lots of water.  3. Call the clinic if he has new or worrisome symptoms.  4. I will call you with the results of the left heart   Please bring all your medications to every doctors visit  Sign up for My Chart to have easy access to your labs results, and communication with your Primary care physician.  Next Appointment  Please make an appointment with Dr Randolm Idol as needed   I look forward to talking with you again at our next visit. If you have any questions or concerns before then, please call the clinic at [phone].  Take Care,   Dr Wenda Low

## 2015-04-26 ENCOUNTER — Telehealth: Payer: Self-pay | Admitting: Family Medicine

## 2015-04-26 NOTE — Telephone Encounter (Signed)
Called Son Laflin) who said she mother continue to do well and is at her baseline. Denies any fevers, confusion or complaints of dysuria or ab pain.  - Informed him that her urine culture is still pending, but he should call if she develops fevers, dysuria, abd pain or confusion.

## 2015-04-27 NOTE — Telephone Encounter (Signed)
Pts daughter called back, and wanted to know results of urine culture.   She would like a call back @ [phone]. Unnamed Zeien, Maryjo Rochester, CMA

## 2015-04-28 LAB — URINE CULTURE: Colony Count: >=100000

## 2015-04-28 NOTE — Telephone Encounter (Signed)
Called and spoke to his son, Lorin Picket.  Informed him that the urine culture was positive for Escherichia coli and Klebsiella.  However, she remains asymptomatic: No current fevers, chills, abdominal pain, dysuria, confusion, or decrease in ADLs. - He agrees with not treating asymptomatic bacteriuria at this time - He will call if she develops any of the above-mentioned symptoms

## 2015-05-01 ENCOUNTER — Telehealth: Payer: Self-pay | Admitting: Cardiology

## 2015-05-01 ENCOUNTER — Ambulatory Visit (INDEPENDENT_AMBULATORY_CARE_PROVIDER_SITE_OTHER): Payer: Medicare Other | Admitting: *Deleted

## 2015-05-01 ENCOUNTER — Telehealth: Payer: Self-pay | Admitting: Internal Medicine

## 2015-05-01 DIAGNOSIS — R55 Syncope and collapse: Secondary | ICD-10-CM | POA: Diagnosis not present

## 2015-05-01 NOTE — Telephone Encounter (Signed)
LMOVM for pt daughter to return call.  

## 2015-05-01 NOTE — Telephone Encounter (Signed)
Opened in error

## 2015-05-01 NOTE — Telephone Encounter (Signed)
New message     Pt daughter calling in regards to message left earlier today about pt's loop recorder Please call to discuss

## 2015-05-03 NOTE — Progress Notes (Signed)
Loop recorder 

## 2015-05-04 NOTE — Telephone Encounter (Signed)
Spoke w/ pt daughter and informed her that pt home monitor is working correctly. Pt daughter verbalized understanding.

## 2015-05-05 ENCOUNTER — Other Ambulatory Visit: Payer: Self-pay | Admitting: Family Medicine

## 2015-05-08 ENCOUNTER — Telehealth: Payer: Self-pay | Admitting: Family Medicine

## 2015-05-08 ENCOUNTER — Encounter (HOSPITAL_COMMUNITY): Payer: Self-pay | Admitting: Emergency Medicine

## 2015-05-08 ENCOUNTER — Inpatient Hospital Stay (HOSPITAL_COMMUNITY)
Admission: EM | Admit: 2015-05-08 | Discharge: 2015-05-10 | DRG: 312 | Disposition: A | Discharge status: Home / Self Care | Payer: Medicare Other | Attending: Family Medicine | Admitting: Family Medicine

## 2015-05-08 DIAGNOSIS — I129 Hypertensive chronic kidney disease with stage 1 through stage 4 chronic kidney disease, or unspecified chronic kidney disease: Secondary | ICD-10-CM | POA: Diagnosis present

## 2015-05-08 DIAGNOSIS — R55 Syncope and collapse: Secondary | ICD-10-CM | POA: Diagnosis present

## 2015-05-08 DIAGNOSIS — I442 Atrioventricular block, complete: Secondary | ICD-10-CM | POA: Diagnosis not present

## 2015-05-08 DIAGNOSIS — F0390 Unspecified dementia without behavioral disturbance: Secondary | ICD-10-CM | POA: Diagnosis present

## 2015-05-08 DIAGNOSIS — K449 Diaphragmatic hernia without obstruction or gangrene: Secondary | ICD-10-CM | POA: Diagnosis present

## 2015-05-08 DIAGNOSIS — Z86718 Personal history of other venous thrombosis and embolism: Secondary | ICD-10-CM | POA: Diagnosis not present

## 2015-05-08 DIAGNOSIS — I10 Essential (primary) hypertension: Secondary | ICD-10-CM

## 2015-05-08 DIAGNOSIS — E785 Hyperlipidemia, unspecified: Secondary | ICD-10-CM | POA: Diagnosis present

## 2015-05-08 DIAGNOSIS — Z79899 Other long term (current) drug therapy: Secondary | ICD-10-CM

## 2015-05-08 DIAGNOSIS — K219 Gastro-esophageal reflux disease without esophagitis: Secondary | ICD-10-CM | POA: Diagnosis present

## 2015-05-08 DIAGNOSIS — Z8673 Personal history of transient ischemic attack (TIA), and cerebral infarction without residual deficits: Secondary | ICD-10-CM

## 2015-05-08 DIAGNOSIS — M1990 Unspecified osteoarthritis, unspecified site: Secondary | ICD-10-CM | POA: Diagnosis present

## 2015-05-08 DIAGNOSIS — E876 Hypokalemia: Secondary | ICD-10-CM | POA: Diagnosis present

## 2015-05-08 DIAGNOSIS — I82409 Acute embolism and thrombosis of unspecified deep veins of unspecified lower extremity: Secondary | ICD-10-CM

## 2015-05-08 DIAGNOSIS — I469 Cardiac arrest, cause unspecified: Secondary | ICD-10-CM

## 2015-05-08 DIAGNOSIS — G8929 Other chronic pain: Secondary | ICD-10-CM | POA: Diagnosis present

## 2015-05-08 DIAGNOSIS — D509 Iron deficiency anemia, unspecified: Secondary | ICD-10-CM | POA: Diagnosis present

## 2015-05-08 DIAGNOSIS — M549 Dorsalgia, unspecified: Secondary | ICD-10-CM | POA: Diagnosis present

## 2015-05-08 DIAGNOSIS — M810 Age-related osteoporosis without current pathological fracture: Secondary | ICD-10-CM | POA: Diagnosis present

## 2015-05-08 DIAGNOSIS — N183 Chronic kidney disease, stage 3 (moderate): Secondary | ICD-10-CM | POA: Diagnosis present

## 2015-05-08 DIAGNOSIS — N390 Urinary tract infection, site not specified: Secondary | ICD-10-CM | POA: Diagnosis not present

## 2015-05-08 DIAGNOSIS — I4430 Unspecified atrioventricular block: Secondary | ICD-10-CM | POA: Diagnosis present

## 2015-05-08 HISTORY — DX: Urinary tract infection, site not specified: N39.0

## 2015-05-08 LAB — BASIC METABOLIC PANEL
ANION GAP: 10 (ref 5–15)
BUN: 6 mg/dL (ref 6–20)
CALCIUM: 9.3 mg/dL (ref 8.9–10.3)
CHLORIDE: 102 mmol/L (ref 101–111)
CO2: 25 mmol/L (ref 22–32)
CREATININE: 0.89 mg/dL (ref 0.44–1.00)
GFR calc non Af Amer: 57 mL/min — ABNORMAL LOW (ref >60)
GLUCOSE: 103 mg/dL — AB (ref 65–99)
POTASSIUM: 3.9 mmol/L (ref 3.5–5.1)
SODIUM: 137 mmol/L (ref 135–145)

## 2015-05-08 LAB — CBC WITH DIFFERENTIAL/PLATELET
BASOS PCT: 0 % (ref 0–1)
Basophils Absolute: 0 10*3/uL (ref 0.0–0.1)
EOS ABS: 0 10*3/uL (ref 0.0–0.7)
Eosinophils Relative: 0 % (ref 0–5)
HEMATOCRIT: 34.1 % — AB (ref 36.0–46.0)
Hemoglobin: 11.3 g/dL — ABNORMAL LOW (ref 12.0–15.0)
LYMPHS ABS: 0.8 10*3/uL (ref 0.7–4.0)
Lymphocytes Relative: 6 % — ABNORMAL LOW (ref 12–46)
MCH: 28.8 pg (ref 26.0–34.0)
MCHC: 33.1 g/dL (ref 30.0–36.0)
MCV: 87 fL (ref 78.0–100.0)
MONO ABS: 0.6 10*3/uL (ref 0.1–1.0)
MONOS PCT: 5 % (ref 3–12)
NEUTROS ABS: 11.1 10*3/uL — AB (ref 1.7–7.7)
Neutrophils Relative %: 89 % — ABNORMAL HIGH (ref 43–77)
Platelets: 328 10*3/uL (ref 150–400)
RBC: 3.92 MIL/uL (ref 3.87–5.11)
RDW: 15 % (ref 11.5–15.5)
WBC: 12.5 10*3/uL — ABNORMAL HIGH (ref 4.0–10.5)

## 2015-05-08 LAB — URINALYSIS, ROUTINE W REFLEX MICROSCOPIC
BILIRUBIN URINE: NEGATIVE
GLUCOSE, UA: NEGATIVE mg/dL
Ketones, ur: NEGATIVE mg/dL
Nitrite: POSITIVE — AB
PH: 6 (ref 5.0–8.0)
Protein, ur: 30 mg/dL — AB
SPECIFIC GRAVITY, URINE: 1.008 (ref 1.005–1.030)
Urobilinogen, UA: 0.2 mg/dL (ref 0.0–1.0)

## 2015-05-08 LAB — CUP PACEART REMOTE DEVICE CHECK: MDC IDC SESS DTM: 20160829130857

## 2015-05-08 LAB — URINE MICROSCOPIC-ADD ON

## 2015-05-08 MED ORDER — DEXTROSE 5 % IV SOLN
1.0000 g | INTRAVENOUS | Status: DC
  Administered 2015-05-09: 1 g via INTRAVENOUS
  Filled 2015-05-08 (×2): qty 10

## 2015-05-08 MED ORDER — PANTOPRAZOLE SODIUM 40 MG PO TBEC
40.0000 mg | DELAYED_RELEASE_TABLET | Freq: Every day | ORAL | Status: DC
  Administered 2015-05-08 – 2015-05-10 (×3): 40 mg via ORAL
  Filled 2015-05-08 (×3): qty 1

## 2015-05-08 MED ORDER — VITAMIN B-12 1000 MCG PO TABS
1000.0000 ug | ORAL_TABLET | Freq: Every day | ORAL | Status: DC
  Administered 2015-05-08 – 2015-05-10 (×3): 1000 ug via ORAL
  Filled 2015-05-08 (×3): qty 1

## 2015-05-08 MED ORDER — SODIUM CHLORIDE 0.9 % IV SOLN
INTRAVENOUS | Status: DC
  Administered 2015-05-08 – 2015-05-10 (×5): via INTRAVENOUS

## 2015-05-08 MED ORDER — DEXTROSE 5 % IV SOLN
1.0000 g | Freq: Once | INTRAVENOUS | Status: AC
  Administered 2015-05-08: 1 g via INTRAVENOUS
  Filled 2015-05-08: qty 10

## 2015-05-08 MED ORDER — ATORVASTATIN CALCIUM 40 MG PO TABS
40.0000 mg | ORAL_TABLET | Freq: Every day | ORAL | Status: DC
  Administered 2015-05-08 – 2015-05-09 (×2): 40 mg via ORAL
  Filled 2015-05-08 (×2): qty 1

## 2015-05-08 MED ORDER — APIXABAN 5 MG PO TABS
5.0000 mg | ORAL_TABLET | Freq: Two times a day (BID) | ORAL | Status: DC
  Administered 2015-05-08 – 2015-05-10 (×4): 5 mg via ORAL
  Filled 2015-05-08 (×4): qty 1

## 2015-05-08 MED ORDER — ACETAMINOPHEN 500 MG PO TABS
500.0000 mg | ORAL_TABLET | Freq: Four times a day (QID) | ORAL | Status: DC | PRN
  Administered 2015-05-09 – 2015-05-10 (×4): 500 mg via ORAL
  Filled 2015-05-08 (×5): qty 1

## 2015-05-08 MED ORDER — AMLODIPINE BESYLATE 10 MG PO TABS
10.0000 mg | ORAL_TABLET | Freq: Every day | ORAL | Status: DC
  Administered 2015-05-08 – 2015-05-10 (×3): 10 mg via ORAL
  Filled 2015-05-08 (×4): qty 1

## 2015-05-08 MED ORDER — GABAPENTIN 100 MG PO CAPS
100.0000 mg | ORAL_CAPSULE | Freq: Three times a day (TID) | ORAL | Status: DC
  Administered 2015-05-08 – 2015-05-10 (×5): 100 mg via ORAL
  Filled 2015-05-08 (×5): qty 1

## 2015-05-08 NOTE — Telephone Encounter (Signed)
Noted  

## 2015-05-08 NOTE — ED Notes (Signed)
Dr. Delton See at bedside with patient and family.

## 2015-05-08 NOTE — ED Provider Notes (Signed)
CSN: [id number]     Arrival date & time    History   First MD Initiated Contact with Patient 05/08/15 0854     Chief Complaint  Patient presents with  . Loss of Consciousness     (Consider location/radiation/quality/duration/timing/severity/associated sxs/prior Treatment) HPI   Victoria Lewis is a 81 y.o. female who was being attended to by her husband and daughter, when she had a near syncopal episode. They were in the process of sitting her up on the edge of the bed, hitting her Gatorade and noted that she was suddenly weak, pale and appeared like she would pass out. Because of this, they laid her back on the bed, and she improved. She did not lose consciousness. Otherwise, they have noticed increased urinary output, today. The patient has reported that she "does not feel good" over the last several days. Family members are concerned that this means that she has a UTI. She recently saw her PCP, to be evaluated for UTI and was felt to be chronically colonized. At that point, no antibodies were given. The patient has been eating well, not vomiting, not having a fever and is not coughing. The patient is confused and does not give history. She lives with her husband and her daughter is there frequently. She requires significant assistance with changing bathing and ambulation using a walker. Is primarily in bed or wheelchair. There are no other known modifying factors.   Past Medical History  Diagnosis Date  . Hypertension   . GERD (gastroesophageal reflux disease)   . H/O hiatal hernia   . Arthritis   . Syncope 07/13/2014  . Bladder infection, chronic     [name] 08/30/2014  . AV block, complete   . Chest pain   . Decreased dorsalis pedis pulse   . Hammer toe   . Hyperlipidemia   . Onychogryposis of toenail   . Osteoporosis   . Scoliosis   . Status post placement of implantable loop recorder   . Syncope   . Uterine disorder   . Diaphragmatic hernia   . Hx of epistaxis   . Chest  pain   . Iron deficiency anemia   . Chronic back pain   . Dementia   . Chronic kidney disease (CKD), stage III (moderate)   . Recurrent UTI (urinary tract infection)    Past Surgical History  Procedure Laterality Date  . Joint replacement    . Cataract extraction, bilateral Bilateral   . Total hip arthroplasty Right 10/11/2013    Procedure: TOTAL HIP ARTHROPLASTY;  Surgeon: Nestor Lewandowsky, MD;  Location: [facility] OR;  Service: Orthopedics;  Laterality: Right;  . Loop recorder implant    . Tubal ligation    . Total hip arthroplasty Left 05/2006    [name] 01/22/2011   Family History  Problem Relation Age of Onset  . Stroke Mother 58    died of stroke  . Hypertension Mother   . Heart disease Mother    Social History  Substance Use Topics  . Smoking status: Never Smoker   . Smokeless tobacco: Never Used  . Alcohol Use: No   OB History    No data available     Review of Systems  All other systems reviewed and are negative.     Allergies  Review of patient's allergies indicates no known allergies.  Home Medications   Prior to Admission medications   Medication Sig Start Date End Date Taking? Authorizing Provider  acetaminophen (TYLENOL) 500 MG  tablet Take 1 tablet (500 mg total) by mouth every 6 (six) hours as needed. Patient taking differently: Take 500 mg by mouth 4 (four) times daily.  09/01/14   Glori Luis, MD  aluminum hydroxide-magnesium carbonate (GAVISCON) 95-358 MG/15ML SUSP Take 30 mLs by mouth as needed for indigestion or heartburn.     Historical Provider, MD  amLODipine (NORVASC) 10 MG tablet TAKE 1 TABLET (10 MG TOTAL) BY MOUTH DAILY. 01/16/15   Duke Salvia, MD  apixaban (ELIQUIS) 5 MG TABS tablet Take 1 tablet (5 mg total) by mouth 2 (two) times daily. 04/11/15   Uvaldo Rising, MD  atorvastatin (LIPITOR) 40 MG tablet TAKE 1 TABLET (40 MG TOTAL) BY MOUTH DAILY AT 6 PM. 01/16/15   Uvaldo Rising, MD  cholecalciferol (VITAMIN D) 1000 UNITS tablet Take 1,000 Units  by mouth daily.    Historical Provider, MD  CRANBERRY PO Take 240 mLs by mouth 3 (three) times daily. Cranberry concentrate solution    Historical Provider, MD  D-MANNOSE PO Take 1 tablet by mouth 2 (two) times daily.    Historical Provider, MD  fluticasone (FLONASE) 50 MCG/ACT nasal spray Place 2 sprays into both nostrils daily. Patient taking differently: Place 2 sprays into both nostrils daily as needed for allergies.  12/05/14   Uvaldo Rising, MD  gabapentin (NEURONTIN) 100 MG capsule Take 1 capsule by mouth 3 (three) times daily. 07/28/14   Historical Provider, MD  nystatin (MYCOSTATIN/NYSTOP) 100000 UNIT/GM POWD Apply to affected area twice daily. 02/28/15   Uvaldo Rising, MD  omeprazole (PRILOSEC) 20 MG capsule Take 1 capsule (20 mg total) by mouth daily. 12/05/14   Uvaldo Rising, MD  Probiotic Product (ALIGN) 4 MG CAPS Take 4 mg by mouth daily.    Historical Provider, MD  vitamin B-12 (CYANOCOBALAMIN) 1000 MCG tablet Take 1,000 mcg by mouth daily.    Historical Provider, MD  Wheat Dextrin (BENEFIBER) POWD Take 1 Dose by mouth daily.    Historical Provider, MD   BP 132/75 mmHg  Pulse 88  Temp(Src) 97.5 F (36.4 C) (Oral)  Resp 20  Ht  (1.651 m)  Wt 135 lb (61.236 kg)  BMI 22.47 kg/m2  SpO2 99% Physical Exam  Constitutional: She appears well-developed. No distress.  Elderly, frail  HENT:  Head: Normocephalic and atraumatic.  Right Ear: External ear normal.  Left Ear: External ear normal.  Eyes: Conjunctivae and EOM are normal. Pupils are equal, round, and reactive to light.  Neck: Normal range of motion and phonation normal. Neck supple.  Cardiovascular: Normal rate, regular rhythm and normal heart sounds.   Pulmonary/Chest: Effort normal and breath sounds normal. She exhibits no bony tenderness.  Abdominal: Soft. There is no tenderness.  Musculoskeletal: Normal range of motion. She exhibits no tenderness.  Neurological: She is alert. No cranial nerve deficit or sensory  deficit. She exhibits normal muscle tone. Coordination normal.  Skin: Skin is warm, dry and intact.  Psychiatric: She has a normal mood and affect. Her behavior is normal.  Nursing note and vitals reviewed.   ED Course  Procedures (including critical care time) Medications - No data to display  Patient Vitals for the past 24 hrs:  BP Temp Temp src Pulse Resp SpO2 Height Weight  05/08/15 1045 132/75 mmHg - - 88 20 99 % - -  05/08/15 1030 140/71 mmHg - - 89 21 99 % - -  05/08/15 1000 145/69 mmHg - - 85 18 100 % - -  05/08/15 0945 135/67 mmHg - - 82 17 99 % - -  05/08/15 0930 137/69 mmHg - - 86 16 98 % - -  05/08/15 0915 150/67 mmHg - - 85 20 99 % - -  05/08/15 0900 146/70 mmHg - - 84 - 100 % - -  05/08/15 0855 145/64 mmHg 97.5 F (36.4 C) Oral 87 17 100 % 5\' 5"  (1.651 m) 135 lb (61.236 kg)  05/08/15 0843 - - - - - 96 % - -    Interrogation of [employer] loop recorder- Indicates 5 sec asystole, at time of event of near-syncope.  12:24 PM Reevaluation with update and discussion. After initial assessment and treatment, an updated evaluation reveals no change in clinical status. Family updated on findings. Yelina Sarratt L  14:50- cardiology (Dr. Delton See)  has evaluated the patient, and feels like her near syncope and asystole, for 5 seconds, was secondary to her UTI. They believe that if her UTIs treated the asystole will resolve. They recommend inpatient admission, and they will follow as a Research scientist (medical).  2:53 PM-Consult complete with FPTS resident. Patient case explained and discussed. She requested BC X 2. She agrees to admit patient for further evaluation and treatment. Call ended at 14:55  Labs Review Labs Reviewed  URINALYSIS, ROUTINE W REFLEX MICROSCOPIC (NOT AT [facility]) - Abnormal; Notable for the following:    Color, Urine COLORLESS (*)    APPearance TURBID (*)    Hgb urine dipstick LARGE (*)    Protein, ur 30 (*)    Nitrite POSITIVE (*)    Leukocytes, UA LARGE (*)    All other  components within normal limits  URINE MICROSCOPIC-ADD ON - Abnormal; Notable for the following:    Squamous Epithelial / LPF FEW (*)    Bacteria, UA MANY (*)    All other components within normal limits  BASIC METABOLIC PANEL - Abnormal; Notable for the following:    Glucose, Bld 103 (*)    GFR calc non Af Amer 57 (*)    All other components within normal limits  CBC WITH DIFFERENTIAL/PLATELET - Abnormal; Notable for the following:    WBC 12.5 (*)    Hemoglobin 11.3 (*)    HCT 34.1 (*)    Neutrophils Relative % 89 (*)    Neutro Abs 11.1 (*)    Lymphocytes Relative 6 (*)    All other components within normal limits  URINE CULTURE    Imaging Review No results found. I have personally reviewed and evaluated these images and lab results as part of my medical decision-making.   EKG Interpretation   Date/Time:  Monday May 08 2015 09:05:08 EDT Ventricular Rate:  87 PR Interval:  174 QRS Duration: 80 QT Interval:  374 QTC Calculation: 450 R Axis:   -23 Text Interpretation:  Sinus rhythm Borderline left axis deviation Low  voltage, precordial leads Minimal ST depression, lateral leads since last  tracing no significant change Confirmed by Effie Shy  MD, Vu Liebman ([id number]) on  05/08/2015 9:28:33 AM      MDM   Final diagnoses:  Near syncope  Asystole  Urinary tract infection without hematuria, site unspecified    Near-syncope associated with a brief asystolic pause, For 5 seconds. Probable UTI, without evidence for severe sepsis. Doubt metabolic instability or impending vascular collapse.  Nursing Notes Reviewed/ Care Coordinated Applicable Imaging Reviewed Interpretation of Laboratory Data incorporated into ED treatment  Plan: Admit to FPTS    Mancel Bale, MD 05/08/15 1501

## 2015-05-08 NOTE — Telephone Encounter (Signed)
FYI to PCP

## 2015-05-08 NOTE — ED Notes (Signed)
Charge RN made aware that patients Loop Recorder to be interrogated.

## 2015-05-08 NOTE — ED Notes (Signed)
Cultures obtained before abx started.

## 2015-05-08 NOTE — ED Notes (Signed)
Consulting civil engineer at Engineer, drilling recorder.

## 2015-05-08 NOTE — ED Notes (Addendum)
PTAR - Patient coming from home with c/o of syncopal episode that lasted for a few minutes.  Denies hitting her head.  Patient remained in the bed during the syncopal episode.  Hx of stroke.  Patient is hard of hearing.  Patient has a permanent loop recorder.

## 2015-05-08 NOTE — Consult Note (Signed)
Admit date: 05/08/2015 Referring Physician  ED Physician Primary Physician Uvaldo Rising, MD Primary Cardiologist  Dr. Graciela Husbands Reason for Consultation  Syncopal Episode, Hx of CHB  HPI: Ms. Bettes is an 81 yo female with PMHx of Complete AV Block, HTN, CKD, h/o CVA, h/o DVT, HLD and h/o recurrent syncope who presented to the ED after a pre-syncopal episode. Patient was being sat up to the side of the bed when she became pale and weak. They laid her back into bed for the symptoms to resolve. Patient has a history of recurrent syncopal episodes in the past which mainly occurred while on the commode and were thought be vasovagal/neurally mediated. However, patient was found to have a complete heart block in 2015 which resolved after beta blockers were discontinued. Patient had an implantable loop recorder placed. Family was not interested in pacing at the time, but is open to discussion.   In the ED, vital signs were stable- afebrile, normotensive, HR 87, RR 17, satting 100% on room air. CBC showed a mild leukocytosis at 12.5. BMET was normal. UA is concerning for infection; however per previous family medicine notes. It appears that patient is colonized and frequently has UTIs. Last UCx on 8/15 grew E. Coli and Klebsiella; however, patient was not treated as she was asymptomatic. EKG showed sinus rhythm. Interrogation of loop recorder showed 5 seconds of asystole around the time of pre-syncopal event.   Daughter strongly believes that patient's symptoms are due to worsening of her UTI. She normally presents this way when her UTIs progress. She has been weak, poor po intake, lightheaded and somnolent. Daughter states this is typical of her UTIs.  2D Echo 12/2014: EF 55-60%, no PFO, no wall motion abnormalities. Nuclear stress 11/2012: Normal LVEF, no ischemia.   PMH:   Past Medical History  Diagnosis Date  . Hypertension   . GERD (gastroesophageal reflux disease)   . H/O hiatal hernia   .  Arthritis   . Syncope 07/13/2014  . Bladder infection, chronic     [name] 08/30/2014  . AV block, complete   . Chest pain   . Decreased dorsalis pedis pulse   . Hammer toe   . Hyperlipidemia   . Onychogryposis of toenail   . Osteoporosis   . Scoliosis   . Status post placement of implantable loop recorder   . Syncope   . Uterine disorder   . Diaphragmatic hernia   . Hx of epistaxis   . Chest pain   . Iron deficiency anemia   . Chronic back pain   . Dementia   . Chronic kidney disease (CKD), stage III (moderate)   . Recurrent UTI (urinary tract infection)     PSH:   Past Surgical History  Procedure Laterality Date  . Joint replacement    . Cataract extraction, bilateral Bilateral   . Total hip arthroplasty Right 10/11/2013    Procedure: TOTAL HIP ARTHROPLASTY;  Surgeon: Nestor Lewandowsky, MD;  Location: [facility] OR;  Service: Orthopedics;  Laterality: Right;  . Loop recorder implant    . Tubal ligation    . Total hip arthroplasty Left 05/2006    /notes 01/22/2011   Allergies:  Review of patient's allergies indicates no known allergies. Prior to Admit Meds:   Prior to Admission medications   Medication Sig Start Date End Date Taking? Authorizing Provider  acetaminophen (TYLENOL) 500 MG tablet Take 1 tablet (500 mg total) by mouth every 6 (six) hours as needed. Patient taking  differently: Take 500 mg by mouth 4 (four) times daily.  09/01/14   Glori Luis, MD  aluminum hydroxide-magnesium carbonate (GAVISCON) 95-358 MG/15ML SUSP Take 30 mLs by mouth as needed for indigestion or heartburn.     Historical Provider, MD  amLODipine (NORVASC) 10 MG tablet TAKE 1 TABLET (10 MG TOTAL) BY MOUTH DAILY. 01/16/15   Duke Salvia, MD  apixaban (ELIQUIS) 5 MG TABS tablet Take 1 tablet (5 mg total) by mouth 2 (two) times daily. 04/11/15   Uvaldo Rising, MD  atorvastatin (LIPITOR) 40 MG tablet TAKE 1 TABLET (40 MG TOTAL) BY MOUTH DAILY AT 6 PM. 01/16/15   Uvaldo Rising, MD  cholecalciferol (VITAMIN D)  1000 UNITS tablet Take 1,000 Units by mouth daily.    Historical Provider, MD  CRANBERRY PO Take 240 mLs by mouth 3 (three) times daily. Cranberry concentrate solution    Historical Provider, MD  D-MANNOSE PO Take 1 tablet by mouth 2 (two) times daily.    Historical Provider, MD  fluticasone (FLONASE) 50 MCG/ACT nasal spray Place 2 sprays into both nostrils daily. Patient taking differently: Place 2 sprays into both nostrils daily as needed for allergies.  12/05/14   Uvaldo Rising, MD  gabapentin (NEURONTIN) 100 MG capsule Take 1 capsule by mouth 3 (three) times daily. 07/28/14   Historical Provider, MD  nystatin (MYCOSTATIN/NYSTOP) 100000 UNIT/GM POWD Apply to affected area twice daily. 02/28/15   Uvaldo Rising, MD  omeprazole (PRILOSEC) 20 MG capsule Take 1 capsule (20 mg total) by mouth daily. 12/05/14   Uvaldo Rising, MD  Probiotic Product (ALIGN) 4 MG CAPS Take 4 mg by mouth daily.    Historical Provider, MD  vitamin B-12 (CYANOCOBALAMIN) 1000 MCG tablet Take 1,000 mcg by mouth daily.    Historical Provider, MD  Wheat Dextrin (BENEFIBER) POWD Take 1 Dose by mouth daily.    Historical Provider, MD   Fam HX:    Family History  Problem Relation Age of Onset  . Stroke Mother 62    died of stroke  . Hypertension Mother   . Heart disease Mother    Social HX:    Social History   Social History  . Marital Status: Married    Spouse Name: Acupuncturist  . Number of Children: 4  . Years of Education: College   Occupational History  . Retired    Social History Main Topics  . Smoking status: Never Smoker   . Smokeless tobacco: Never Used  . Alcohol Use: No  . Drug Use: No  . Sexual Activity: Not on file   Other Topics Concern  . Not on file   Social History Narrative   Lives with her husband [place].    Caffeine use: rare     ROS:   General: Denies fever, chills, fatigue, change in appetite   Respiratory: Denies SOB, cough   Cardiovascular: Denies chest pain and palpitations.    Gastrointestinal: Denies nausea, vomiting, abdominal pain Genitourinary: +urinary incontinence. Denies dysuria, urgency, frequency, hematuria, suprapubic pain and flank pain. Neurological: Denies dizziness, headaches, weakness, lightheadedness  Filed Vitals:   05/08/15 1315 05/08/15 1330 05/08/15 1400 05/08/15 1415  BP: 144/73 146/72 143/65 142/72  Pulse: 91 90 93 96  Temp:      TempSrc:      Resp: 18 16 22 24   Height:      Weight:      SpO2: 99% 97% 98% 100%   General: Vital signs reviewed.  Patient  is an elderly female, in no acute distress and cooperative with exam.   Cardiovascular: RRR, S1 normal, S2 normal Pulmonary/Chest: +Rhonchi, no wheezes or rales Abdominal: Soft, non-tender, non-distended, BS + Extremities: Trace lower extremity pitting edema bilaterally, pulses symmetric and intact bilaterally.  Skin: Warm, dry and intact. No rashes or erythema. Psychiatric: Normal mood and affect. speech and behavior is normal. Cognition and memory are abnormal.      Labs: Lab Results  Component Value Date   WBC 12.5* 05/08/2015   HGB 11.3* 05/08/2015   HCT 34.1* 05/08/2015   MCV 87.0 05/08/2015   PLT 328 05/08/2015     Recent Labs Lab 05/08/15 1144  NA 137  K 3.9  CL 102  CO2 25  BUN 6  CREATININE 0.89  CALCIUM 9.3  GLUCOSE 103*   Lab Results  Component Value Date   CHOL 291* 12/20/2014   HDL 44 12/20/2014   LDLCALC 211* 12/20/2014   TRIG 178* 12/20/2014   Lab Results  Component Value Date   DDIMER 1.66* 07/17/2014    EKG: Sinus rhythm. Personally viewed.   ASSESSMENT/PLAN:    Pre-Syncope: 5 seconds of Asystole identified on ILR. Patient has history of recurrent syncopal episodes secondary to vasovagal and neurally mediated situations. Her history of CHB resolved after cessation of beta blocker. EKG is sinus rhythm. Patient's pre-syncopal episodes typically worsen in the setting of infection. She normally does well when her UTIs are controlled. Patient  could be considered for pacemaker, but given current UTI, pacemaker could not be implanted until infection cleared. Family is agreeable to pacemaker if necessary, but would like to avoid if possible given significant dementia. Cardiology believes UTI is primary problem. We can continue to follow along to see if any recurrent symptoms or ventricular arrythmias occur.  -Telemetry -Cardiology will continue to follow along as consultants   HTN: Currently normotensive. Patient is on amlodipine 10 mg daily at home.  -Continue amlodipine 10 mg daily  HLD: Patient is on atorvastatin 40 mg QHS. -Continue atorvastatin 40 mg QHS.  UTI: UA shows many bacteria, +leukocytes, +nitrites, WBC TNTC. UCx on 8/15 grew Klebsiella and E. Coli, but patient was not treated as she is felt to be chronically colonized and is asymptomatic. Patient denies urinary symptoms, but is more weak, somnolent and lightheaded with poor po intake. Patient does have a history of asymptomatic bacteruria; however, it is difficult to illicit a true history and symptoms from patient given dementia. Per daughter, patient never complains of symptoms, but when her UTIs are overwhelming she typically acts like this, feels weak and unwell. Afebrile, mild leukocytosis of 12. We believe this is the primary issue at this time. Even with consideration of pacemaker, patient would need to be cleared of infection prior to implantation of pacemaker.  -Admit to [facility] Medicine Teaching Service  H/o DVT: Patient was started on Eliquis 5 mg BID on 01/26/15 which should be continued until 07/29/15. If weight drops below 60 kg, decrease dose to 2.5 mg BID given age > 11.  -Continue Eliquis 5 mg BID  DVT/PE ppx: Eliquis 5 mg BID  Jill Alexanders, DO PGY-2 Internal Medicine Resident Pager # [phone] 05/08/2015 2:48 PM  The patient was seen, examined and discussed with Jill Alexanders, DO and I agree with the above.   This is a very pleasant 81 year old  female with dementia and chronic UTI who has been followed by Dr Graciela Husbands for recurrent syncope and documented 3. AVB. Because her episodes were always  vagal (in the settings of straining) or in the settings of sepsis, UTI, under narcotic use or with betablocker use a pacemaker was not considered.  The daughter says that the situation gets worse when she has UTI. She is chronically colonized with E.coli, she has been on different ATB but became resistant and currently is off ATB. The daughter states that this am she felt febrile, diaphoretic, her urine is very thick and foul smelling. She became presyncopal when she was trying to get her off bed to the bedside commode. A loop recorder interrogation showed 2:1 block followed by 3. AVB with 5 second pause.  I had a long talk with the daughter. Considering that her episodes occur mostly in the settings of urosepsis, we will have IM admit and treat, she most probably needs iv ATB for resistant e.coli. She is currently also not a good candidate for PM implantation as there is a high risk of device related bacteremia. We will follow. The daughter agrees. She doesn't want her mother to have a PM implanted unless absolutely necessary. If she continues to have pauses once she recovers from acute urosepsis we would consider a PM.  Lars Masson 05/08/2015

## 2015-05-08 NOTE — ED Notes (Signed)
Attempted 2 IV sticks with patient.  Phlebotomy phoned to come at bedside for blood draw.

## 2015-05-08 NOTE — ED Notes (Signed)
Patient given oral fluids to drink.

## 2015-05-08 NOTE — H&P (Signed)
Family Medicine Teaching [facility] Admission History and Physical Service Pager: [phone]  [name] Medical record number: [id number] Date of birth: 1928/05/06 Age: 81 y.o. Gender: female  Primary Care Provider: Uvaldo Rising, MD Consultants: Cardiology Code Status: Full  Chief Complaint: Pre syncope  Assessment and Plan: Victoria Lewis is a 81 y.o. female presenting with pre- syncopal episodes. PMH is significant for history of syncopal episodes, HTN, CKD III, chronic cystitis, dementia, GERD, Anemia, Hiatal Hernia, and Arthritis.  Pre- syncopal episode- She has a history of similar episodes in the past which have been associated with infections per her daughter as well as a history of recurrent syncopal episodes associated with using the rest room, and found to have transient complete heart block on loop recorder ( in 2015) which resolved after cessation of beta blockers.   In the ED Cardiology was called and her loop recorder was interoggated and showed a 5 seconds of asystole. In past pt's family did not wish to have a pacemaker placed, but per Cardiology after clearance of current infection it may be considered - Admit to [facility] medicine service Attending Dr Victoria Lewis - Telemetry - Cardiology following, appreciate recs  UTI- Urine culture on 8/15 showed ecoli and klebsiella. UA 8/29 consistent with UTI - BCx x2 - Repeat UCx - continue ceftriaxone 1g qD, tailor according to repeat UCx  H/o DVT/CVA/H/o Paroxismal PE- Stable - Continue home eliquis  HTN- Stable - Continue home norvasc  CKD III- Cr 1.03, last Cr 03/07/2015 was 1.02, stable - trend BMPs  GERD- Stable - continue home protonix  Chronic Bck pain- stable - Continue home tylenol, gabapentin  FEN/GI: Regular diet, MIVR NS 125 cc/hr Prophylaxis: Eliquis 5 mg BID  Disposition: Admit to Family medicine Teaching service  History of Present Illness:  Much of history obtained from  daughter secondary to pt's history of dementia   Victoria Lewis is a 81 y.o. female presenting with near syncopal episode.   This morning around 630 am patient was in the house getting changed when daughter was called to the room as she developed a coughing fit. She was sitting on the bed. Never "fully passed out" and daughter laid her down. The episode primarily of severe coughing, felt warm, looked like she was going to vomit but didn't. Measured temp of 98.64F (states usually 49F). She the went to stand was noted to be incontinent of urine, which is normal for her as she wakes up in the morning wet. Has been having a cough but not much different recently. There was a coughing fit yesterday as well but not as severe. She was additionally thought to be " woozy" yesterday and was given gatorade which helped. She has a history of frequent UTIs and has had some malodor recently. No fevers, emesis or pain per daughter  Pt denies abdominal pain, dysuria, nausea or emesis  Review Of Systems: Per HPI  Limited secondary to pt's ability to recall  Patient Active Problem List   Diagnosis Date Noted  . Abnormal urine odor 04/24/2015  . PFO (patent foramen ovale) 03/23/2015  . Cerebral infarction due to embolism of right middle cerebral artery 03/23/2015  . Paradoxical embolism 03/23/2015  . Enterococcus UTI 03/07/2015  . UTI (lower urinary tract infection) 03/07/2015  . Intertrigo 01/24/2015  . Skin lesion of face 01/24/2015  . DVT (deep venous thrombosis) 12/27/2014  . Cough 12/27/2014  . Stroke with cerebral ischemia   . HLD (hyperlipidemia)   .  Essential hypertension   . Focal motor deficit 12/19/2014  . Low back pain radiating to left leg 11/01/2014  . Complete heart block 07/14/2014  . Dementia   . Chronic kidney disease (CKD), stage III (moderate) 04/11/2014  . History of urinary anomaly 03/04/2014  . Recurrent UTI 02/28/2014   Past Medical History: Past Medical History  Diagnosis  Date  . Hypertension   . GERD (gastroesophageal reflux disease)   . H/O hiatal hernia   . Arthritis   . Syncope 07/13/2014  . Bladder infection, chronic     [name] 08/30/2014  . AV block, complete   . Chest pain   . Decreased dorsalis pedis pulse   . Hammer toe   . Hyperlipidemia   . Onychogryposis of toenail   . Osteoporosis   . Scoliosis   . Status post placement of implantable loop recorder   . Syncope   . Uterine disorder   . Diaphragmatic hernia   . Hx of epistaxis   . Chest pain   . Iron deficiency anemia   . Chronic back pain   . Dementia   . Chronic kidney disease (CKD), stage III (moderate)   . Recurrent UTI (urinary tract infection)    Past Surgical History: Past Surgical History  Procedure Laterality Date  . Joint replacement    . Cataract extraction, bilateral Bilateral   . Total hip arthroplasty Right 10/11/2013    Procedure: TOTAL HIP ARTHROPLASTY;  Surgeon: Nestor Lewandowsky, MD;  Location: [facility] OR;  Service: Orthopedics;  Laterality: Right;  . Loop recorder implant    . Tubal ligation    . Total hip arthroplasty Left 05/2006    [name] 01/22/2011   Social History: Social History  Substance Use Topics  . Smoking status: Never Smoker   . Smokeless tobacco: Never Used  . Alcohol Use: No   Additional social history: Lives with husband Please also refer to relevant sections of EMR.  Family History: Family History  Problem Relation Age of Onset  . Stroke Mother 73    died of stroke  . Hypertension Mother   . Heart disease Mother    Allergies and Medications: No Known Allergies No current facility-administered medications on file prior to encounter.   Current Outpatient Prescriptions on File Prior to Encounter  Medication Sig Dispense Refill  . acetaminophen (TYLENOL) 500 MG tablet Take 1 tablet (500 mg total) by mouth every 6 (six) hours as needed. (Patient taking differently: Take 500 mg by mouth 4 (four) times daily. ) 30 tablet 0  . aluminum  hydroxide-magnesium carbonate (GAVISCON) 95-358 MG/15ML SUSP Take 30 mLs by mouth as needed for indigestion or heartburn.     Marland Kitchen amLODipine (NORVASC) 10 MG tablet TAKE 1 TABLET (10 MG TOTAL) BY MOUTH DAILY. 30 tablet 10  . apixaban (ELIQUIS) 5 MG TABS tablet Take 1 tablet (5 mg total) by mouth 2 (two) times daily. 60 tablet 2  . atorvastatin (LIPITOR) 40 MG tablet TAKE 1 TABLET (40 MG TOTAL) BY MOUTH DAILY AT 6 PM. 90 tablet 1  . cholecalciferol (VITAMIN D) 1000 UNITS tablet Take 1,000 Units by mouth daily.    Marland Kitchen CRANBERRY PO Take 240 mLs by mouth 3 (three) times daily. Cranberry concentrate solution    . D-MANNOSE PO Take 1 tablet by mouth 2 (two) times daily.    . fluticasone (FLONASE) 50 MCG/ACT nasal spray Place 2 sprays into both nostrils daily. (Patient taking differently: Place 2 sprays into both nostrils daily as needed for  allergies. ) 16 g 6  . gabapentin (NEURONTIN) 100 MG capsule Take 1 capsule by mouth 3 (three) times daily.  2  . nystatin (MYCOSTATIN/NYSTOP) 100000 UNIT/GM POWD Apply to affected area twice daily. 60 g 1  . omeprazole (PRILOSEC) 20 MG capsule TAKE 1 CAPSULE (20 MG TOTAL) BY MOUTH DAILY. 90 capsule 1  . Probiotic Product (ALIGN) 4 MG CAPS Take 4 mg by mouth daily.    . vitamin B-12 (CYANOCOBALAMIN) 1000 MCG tablet Take 1,000 mcg by mouth daily.    . Wheat Dextrin (BENEFIBER) POWD Take 1 Dose by mouth daily.      Objective: BP 142/72 mmHg  Pulse 96  Temp(Src) 97.5 F (36.4 C) (Oral)  Resp 24  Ht  (1.651 m)  Wt 135 lb (61.236 kg)  BMI 22.47 kg/m2  SpO2 100% Exam: General: NAD, lying in bed HEENT: EOMI, NCAT, MMM Cardiovascular: RRR, no m/r/g Respiratory: CTAB Abdomen: soft non tender, non distended, + BS Skin: round lesion approx 1 cm in diameter on chest Neuro: no focal deficits Psych: Pleasant, follows commands  Labs and Imaging: CBC BMET   Recent Labs Lab 05/08/15 1144  WBC 12.5*  HGB 11.3*  HCT 34.1*  PLT 328    Recent Labs Lab  05/08/15 1144  NA 137  K 3.9  CL 102  CO2 25  BUN 6  CREATININE 0.89  GLUCOSE 103*  CALCIUM 9.3      Brick Ketcher A. Kennon Rounds MD, MS Family Medicine Resident PGY-2 FPTS Intern pager: [phone], text pages welcome

## 2015-05-08 NOTE — ED Notes (Signed)
Victoria Lewis with [employer] [phone] x 2 then 4

## 2015-05-08 NOTE — Telephone Encounter (Signed)
Daughter called because her mother is at the ER this morning and not sure if they are admitting her or not. jw

## 2015-05-09 ENCOUNTER — Encounter (HOSPITAL_COMMUNITY): Payer: Self-pay | Admitting: General Practice

## 2015-05-09 DIAGNOSIS — I469 Cardiac arrest, cause unspecified: Secondary | ICD-10-CM

## 2015-05-09 DIAGNOSIS — N390 Urinary tract infection, site not specified: Secondary | ICD-10-CM

## 2015-05-09 DIAGNOSIS — R55 Syncope and collapse: Secondary | ICD-10-CM | POA: Insufficient documentation

## 2015-05-09 LAB — CBC
HCT: 29 % — ABNORMAL LOW (ref 36.0–46.0)
HEMOGLOBIN: 9.5 g/dL — AB (ref 12.0–15.0)
MCH: 28.5 pg (ref 26.0–34.0)
MCHC: 32.8 g/dL (ref 30.0–36.0)
MCV: 87.1 fL (ref 78.0–100.0)
PLATELETS: 300 10*3/uL (ref 150–400)
RBC: 3.33 MIL/uL — AB (ref 3.87–5.11)
RDW: 15 % (ref 11.5–15.5)
WBC: 6 10*3/uL (ref 4.0–10.5)

## 2015-05-09 LAB — BASIC METABOLIC PANEL
ANION GAP: 7 (ref 5–15)
BUN: 7 mg/dL (ref 6–20)
CALCIUM: 8.3 mg/dL — AB (ref 8.9–10.3)
CO2: 25 mmol/L (ref 22–32)
Chloride: 105 mmol/L (ref 101–111)
Creatinine, Ser: 0.84 mg/dL (ref 0.44–1.00)
GLUCOSE: 91 mg/dL (ref 65–99)
Potassium: 3.3 mmol/L — ABNORMAL LOW (ref 3.5–5.1)
Sodium: 137 mmol/L (ref 135–145)

## 2015-05-09 MED ORDER — POTASSIUM CHLORIDE CRYS ER 20 MEQ PO TBCR
40.0000 meq | EXTENDED_RELEASE_TABLET | Freq: Two times a day (BID) | ORAL | Status: DC
  Administered 2015-05-09 – 2015-05-10 (×3): 40 meq via ORAL
  Filled 2015-05-09 (×3): qty 2

## 2015-05-09 NOTE — Progress Notes (Signed)
Family Medicine Teaching Service Daily Progress Note Intern Pager: [phone]  [name] Medical record number: [id number] Date of birth: June 27, 1928 Age: 80 y.o. Gender: female  Primary Care Provider: Uvaldo Rising, MD Consultants: None Code Status: Full  Pt Overview and Major Events to Date:  8/29: Admit for UTI, pre syncopal episode  Assessment and Plan:  Victoria Lewis is a 81 y.o. female presenting with pre- syncopal episodes. PMH is significant for history of syncopal episodes, HTN, CKD III, chronic cystitis, dementia, GERD, Anemia, Hiatal Hernia, and Arthritis.  Pre- syncopal episode- no further episodes of pre syncope since admission. Secondary to infection vs vasovagal response from coughing - Telemetry - Cardiology following, appreciate recs - continue to consider pace maker after resolution of acute UTI  UTI- no fevers, WBC 6.0, stable - f/u BCx x2, Repeat UCx - Ceftriaxone 1g qD. Transition to bactrim 8/31 to complete 7 day course of abx  HypoKalemia: K 3.3 - Kdur 40 mEq PO  H/o DVT/CVA/H/o Paroxismal PE- Stable - Continue home eliquis  HTN- Stable - Continue home norvasc  CKD III- Cr 1.03, last Cr 03/07/2015 was 1.02, stable - trend BMPs  HLD-stable - continue atorvastatin  GERD- Stable - continue home protonix  Chronic Bck pain- stable - Continue home tylenol, gabapentin  FEN/GI: Regular diet, MIVR NS 125 cc/hr Prophylaxis: Eliquis 5 mg BID  Disposition: Admit to Family medicine Teaching service  Subjective:  Initially states she "does not feel well" after being changed and bathed after an episodes of urinary incontinence. Denies abd pain, chest pain, SOB, headache or any other localized pain. During my exam and my discussion with her son she began to improve and then stated she felt better Per her son she always says she " feels bad" after being changed and denies any pain or localizing symptoms. She typically states she  feels better after a few minutes from being changed and bathed  Objective: Temp:  [97.5 F (36.4 C)-98.8 F (37.1 C)] 98.8 F (37.1 C) (08/30 0528) Pulse Rate:  [82-99] 88 (08/30 0528) Resp:  [13-25] 16 (08/30 0528) BP: (120-150)/(53-93) 136/53 mmHg (08/30 0528) SpO2:  [96 %-100 %] 99 % (08/30 0528) Weight:  [135 lb (61.236 kg)] 135 lb (61.236 kg) (08/29 0855) Physical Exam: General: NAD, lying in bed HEENT: EOMI, NCAT, MMM Cardiovascular: RRR, no m/r/g Respiratory: CTAB Abdomen: soft non tender, non distended, + BS Skin: round lesion approx 1 cm in diameter on chest Neuro: no focal deficits Psych: Pleasant, follows commands, AO x1 to person  Laboratory:  Recent Labs Lab 05/08/15 1144 05/09/15 0335  WBC 12.5* 6.0  HGB 11.3* 9.5*  HCT 34.1* 29.0*  PLT 328 300    Recent Labs Lab 05/08/15 1144 05/09/15 0335  NA 137 137  K 3.9 3.3*  CL 102 105  CO2 25 25  BUN 6 7  CREATININE 0.89 0.84  CALCIUM 9.3 8.3*  GLUCOSE 103* 91      Victoria Lewis Victoria Ehlers, MD 05/09/2015, 8:13 AM PGY-2, [place] [facility] FPTS Intern pager: [phone], text pages welcome

## 2015-05-09 NOTE — Discharge Summary (Signed)
Family Medicine Teaching [facility] Discharge Summary  [name] Medical record number: [id number] Date of birth: June 03, 1928 Age: 81 y.o. Gender: female Date of Admission: 05/08/2015  Date of Discharge: 05/10/2015  Admitting Physician: Leighton Roach McDiarmid, MD  Primary Care Provider: Uvaldo Rising, MD Consultants: Cardiology  Indication for Hospitalization:  Pre-syncope   Discharge Diagnoses/Problem List:  Patient Active Problem List   Diagnosis Date Noted  . Asystole   . Near syncope   . Urinary tract infectious disease   . Abnormal urine odor 04/24/2015  . PFO (patent foramen ovale) 03/23/2015  . Cerebral infarction due to embolism of right middle cerebral artery 03/23/2015  . Paradoxical embolism 03/23/2015  . Enterococcus UTI 03/07/2015  . UTI (lower urinary tract infection) 03/07/2015  . Intertrigo 01/24/2015  . Skin lesion of face 01/24/2015  . DVT (deep venous thrombosis) 12/27/2014  . Cough 12/27/2014  . Stroke with cerebral ischemia   . HLD (hyperlipidemia)   . Essential hypertension   . Focal motor deficit 12/19/2014  . Low back pain radiating to left leg 11/01/2014  . Complete heart block 07/14/2014  . Dementia   . Chronic kidney disease (CKD), stage III (moderate) 04/11/2014  . History of urinary anomaly 03/04/2014  . Recurrent UTI 02/28/2014     Disposition: Stable  Discharge Condition: Home  Discharge Exam:   Objective: Temp: [98.1 F (36.7 C)-99 F (37.2 C)] 98.2 F (36.8 C) (08/31 0426) Pulse Rate: [84-105] 84 (08/31 0426) Resp: [16-17] 16 (08/31 0426) BP: (127-144)/(63-69) 144/69 mmHg (08/31 0426) SpO2: [95 %-99 %] 97 % (08/31 0426) Physical Exam: General: NAD, lying in bed HEENT: EOMI, NCAT, MMM Cardiovascular: RRR, no m/r/g Respiratory: CTAB Abdomen: soft non tender, non distended, + BS Skin: round lesion approx 1 cm in diameter on chest Neuro: no focal deficits Psych: Pleasant, follows commands, AO x1  to person  Brief Hospital Course:   81 y/o F with PMH is significant for history of syncopal episodes, HTN, CKD III, chronic cystitis, dementia, GERD, Anemia, Hiatal Hernia, and Arthritis. Presented with near syncope event with coughing. She additionally has a urinary tract infection and family felt she was not herself in the preceding day prior to presentation. She was diagnosed with UTI by culture on 8/15 which grew Ecoli and Klebsiella. The decision was made to hold on treating it as she was asymtpomatc at that time. On presentation she had foul smelling urine concerning for symptomatic infection. Cardiology was consulted and on interrogation of the Loop recorder, it showed AV block followed by a 5 second pause. Given her acute infection the decision was made to monitor on telemetry. Pacemaker placement was discussed but deffered given infection and was finally felt by cardiology not to be indicated. She had no further events on telemetry For her UTI she had blood and urine cultures collected. Blood cultures were negative on day of discharge, urine cultures grew gram negative rods Ceftriaxone was initiated and maintained for 2 days then she was transitiopned to bactrim based on prior urine cultures on 8/15 to complete a 7 day course of antibiotics     Issues for Follow Up:  1. Final blood cultures. Final urine culture speciation to ensure appropriate antibiotic choice 2. Cardiology follow up for discussion of pace maker placement   Significant Procedures:  None  Significant Labs and Imaging:   Recent Labs Lab 05/08/15 1144 05/09/15 0335 05/10/15 0359  WBC 12.5* 6.0 7.6  HGB 11.3* 9.5* 10.4*  HCT 34.1* 29.0*  32.1*  PLT 328 300 323    Recent Labs Lab 05/08/15 1144 05/09/15 0335 05/10/15 0359  NA 137 137 137  K 3.9 3.3* 4.2  CL 102 105 108  CO2 25 25 21*  GLUCOSE 103* 91 83  BUN CREATININE 0.89 0.84 0.81  CALCIUM 9.3 8.3* 8.5*      Results/Tests Pending at  Time of Discharge:  1. Blood Cultures 2. Urine Culture Speciation  Discharge Medications:    Medication List    TAKE these medications        acetaminophen 500 MG tablet  Commonly known as:  TYLENOL  Take 1 tablet (500 mg total) by mouth every 6 (six) hours as needed.     ALIGN 4 MG Caps  Take 4 mg by mouth daily.     amLODipine 10 MG tablet  Commonly known as:  NORVASC  TAKE 1 TABLET (10 MG TOTAL) BY MOUTH DAILY.     apixaban 5 MG Tabs tablet  Commonly known as:  ELIQUIS  Take 1 tablet (5 mg total) by mouth 2 (two) times daily.     atorvastatin 40 MG tablet  Commonly known as:  LIPITOR  TAKE 1 TABLET (40 MG TOTAL) BY MOUTH DAILY AT 6 PM.     BENEFIBER Powd  Take 1 Dose by mouth daily.     cholecalciferol 1000 UNITS tablet  Commonly known as:  VITAMIN D  Take 1,000 Units by mouth daily.     CRANBERRY PO  Take 240 mLs by mouth 3 (three) times daily. Cranberry concentrate solution     D-MANNOSE PO  Take 1 tablet by mouth 2 (two) times daily.     fluticasone 50 MCG/ACT nasal spray  Commonly known as:  FLONASE  Place 2 sprays into both nostrils daily.     gabapentin 100 MG capsule  Commonly known as:  NEURONTIN  Take 100 mg by mouth 3 (three) times daily.     nystatin 100000 UNIT/GM Powd  Apply to affected area twice daily.     omeprazole 20 MG capsule  Commonly known as:  PRILOSEC  TAKE 1 CAPSULE (20 MG TOTAL) BY MOUTH DAILY.     sulfamethoxazole-trimethoprim 800-160 MG per tablet  Commonly known as:  BACTRIM DS,SEPTRA DS  Take 1 tablet by mouth every 12 (twelve) hours.     vitamin B-12 1000 MCG tablet  Commonly known as:  CYANOCOBALAMIN  Take 1,000 mcg by mouth daily.        Discharge Instructions: Please refer to Patient Instructions section of EMR for full details.  Patient was counseled important signs and symptoms that should prompt return to medical care, changes in medications, dietary instructions, activity restrictions, and follow up  appointments.   Follow-Up Appointments: Follow-up Information    Follow up with Uvaldo Rising, MD.   Specialty:  Family Medicine   Contact information:   [address] [place] [address] [phone]       Schedule an appointment as soon as possible for a visit with Sherryl Manges, MD.   Specialty:  Cardiology   Contact information:   [address] [place] [address] [phone]       Bonney Aid, MD 05/10/2015, 12:18 PM PGY-2, [facility]

## 2015-05-09 NOTE — Progress Notes (Signed)
[name] Date of Encounter: 05/09/2015   Pt. Profile:  Victoria Lewis is an 81 yo female with PMHx of Complete AV Block, HTN, CKD, h/o CVA, h/o DVT, HLD and h/o recurrent syncope who presented to the ED after a pre-syncopal episode.   SUBJECTIVE Son at bedside. No chest pain or sob.   CURRENT MEDS . amLODipine  10 mg Oral Daily  . apixaban  5 mg Oral BID  . atorvastatin  40 mg Oral q1800  . cefTRIAXone (ROCEPHIN)  IV  1 g Intravenous Q24H  . gabapentin  100 mg Oral TID  . pantoprazole  40 mg Oral Daily  . potassium chloride  40 mEq Oral BID  . vitamin B-12  1,000 mcg Oral Daily    OBJECTIVE  Filed Vitals:   05/08/15 1630 05/08/15 1745 05/08/15 2143 05/09/15 0528  BP: 131/67 147/85 143/64 136/53  Pulse: 90 98 99 88  Temp:  98.2 F (36.8 C) 98.6 F (37 C) 98.8 F (37.1 C)  TempSrc:  Oral Oral Oral  Resp: 25 20 15 16   Height:      Weight:      SpO2: 96% 99% 99% 99%    Intake/Output Summary (Last 24 hours) at 05/09/15 1226 Last data filed at 05/09/15 0600  Gross per 24 hour  Intake   1819 ml  Output      0 ml  Net   1819 ml   Filed Weights   05/08/15 0855  Weight: 135 lb (61.236 kg)    PHYSICAL EXAM  General: Pleasant, elderly woman in NAD. Neuro: Alert and oriented X 3. Moves all extremities spontaneously. Psych: dementia HEENT:  Normal  Neck: Supple without bruits or JVD. Lungs:  Resp regular and unlabored. Diminished breath sound bibasilar.  Heart: RRR no s3, s4, or murmurs. Abdomen: Soft, non-tender, non-distended, BS + x 4.  Extremities: No clubbing, cyanosis. DP/PT/Radials 2+ and equal bilaterally. Trace LE edema.   Accessory Clinical Findings  CBC  Recent Labs  05/08/15 1144 05/09/15 0335  WBC 12.5* 6.0  NEUTROABS 11.1*  --   HGB 11.3* 9.5*  HCT 34.1* 29.0*  MCV 87.0 87.1  PLT 328 300   Basic Metabolic Panel  Recent Labs  05/08/15 1144 05/09/15 0335  NA 137 137  K 3.9 3.3*  CL 102 105  CO2 25 25    GLUCOSE 103* 91  BUN 6 7  CREATININE 0.89 0.84  CALCIUM 9.3 8.3*    TELE  NSR with few PVCs  Radiology/Studies  No results found.  ASSESSMENT AND PLAN Victoria Lewis is an 81 yo female with PMHx of Complete AV Block, HTN, CKD, h/o CVA, h/o DVT, HLD and h/o recurrent syncope who presented to the ED after a pre-syncopal episode.  1. Asystole: A loop recorder interrogation showed 2:1 block followed by 3. AVB with 5 second pause. Patient has history of recurrent syncopal episodes secondary to vasovagal and neurally mediated situations. This episode in setting of urosepsis.  -  No further episode. Continue to monitor and f/u with Dr. Graciela Husbands.   2. UTI (lower urinary tract infection) - per primary  3. Hypokalemia - K of 3.3. Supplement given Signed, Bhagat,Bhavinkumar PA-C Pager [phone]  History and all data above reviewed.  Patient examined.  I agree with the findings as above.  She is confused but in no distress.  No SOB.  No pain.  The patient exam reveals COR:RRR  ,  Lungs: Clear  ,  Abd: Positive  bowel sounds, no rebound no guarding, Ext No edema  .  All available labs, radiology testing, previous records reviewed. Agree with documented assessment and plan. Tele reviewed.  No pauses overnight.  No need for pacemaker at this point.    Fayrene Fearing Krystall Kruckenberg  2:52 PM  05/09/2015

## 2015-05-10 DIAGNOSIS — R55 Syncope and collapse: Principal | ICD-10-CM

## 2015-05-10 LAB — CBC
HEMATOCRIT: 32.1 % — AB (ref 36.0–46.0)
HEMOGLOBIN: 10.4 g/dL — AB (ref 12.0–15.0)
MCH: 28.5 pg (ref 26.0–34.0)
MCHC: 32.4 g/dL (ref 30.0–36.0)
MCV: 87.9 fL (ref 78.0–100.0)
Platelets: 323 10*3/uL (ref 150–400)
RBC: 3.65 MIL/uL — AB (ref 3.87–5.11)
RDW: 15.4 % (ref 11.5–15.5)
WBC: 7.6 10*3/uL (ref 4.0–10.5)

## 2015-05-10 LAB — BASIC METABOLIC PANEL
Anion gap: 8 (ref 5–15)
BUN: 8 mg/dL (ref 6–20)
CHLORIDE: 108 mmol/L (ref 101–111)
CO2: 21 mmol/L — AB (ref 22–32)
Calcium: 8.5 mg/dL — ABNORMAL LOW (ref 8.9–10.3)
Creatinine, Ser: 0.81 mg/dL (ref 0.44–1.00)
GFR calc Af Amer: >60 mL/min (ref >60)
GFR calc non Af Amer: >60 mL/min (ref >60)
GLUCOSE: 83 mg/dL (ref 65–99)
POTASSIUM: 4.2 mmol/L (ref 3.5–5.1)
Sodium: 137 mmol/L (ref 135–145)

## 2015-05-10 MED ORDER — SULFAMETHOXAZOLE-TRIMETHOPRIM 800-160 MG PO TABS: 1.0000 | ORAL_TABLET | Freq: Two times a day (BID) | ORAL | Status: AC

## 2015-05-10 MED ORDER — SULFAMETHOXAZOLE-TRIMETHOPRIM 800-160 MG PO TABS
1.0000 | ORAL_TABLET | Freq: Two times a day (BID) | ORAL | Status: DC
  Administered 2015-05-10: 1 via ORAL
  Filled 2015-05-10: qty 1

## 2015-05-10 NOTE — Discharge Instructions (Signed)
°  You were admitted for a urinary tract infection as well as concern for abnormal heart rhythm causing her to nearly pass out.  You were started on an antibiotic called Bactrim to be taken twice daily and should end on 9/4. If you have fevers, further concern for foul smelling urine or change in behavior please call the Family medicine office Please make a follow up appointment with your Cardiologist to further discuss potential pace maker placement     Follow-up Information    Follow up with Uvaldo Rising, MD.   Specialty:  Family Medicine   Contact information:   [address] [place] [address] [phone]       Schedule an appointment as soon as possible for a visit with Sherryl Manges, MD.   Specialty:  Cardiology   Contact information:   [address] [place] [address] [phone]

## 2015-05-10 NOTE — Progress Notes (Signed)
Discharge instructions gone over with patient and son. Follow up appointment to be made. Home medications gone over. Prescriptions called in. Diet and activity discussed. Urged completion of oral antibiotics. Discussed symptoms of infections and reasons to call the doctor. Son verbalized understanding of instructions.

## 2015-05-10 NOTE — Progress Notes (Signed)
    SUBJECTIVE:  Very confused.  No distress   PHYSICAL EXAM Filed Vitals:   05/09/15 1430 05/09/15 1809 05/09/15 2047 05/10/15 0426  BP: 140/68 127/64 133/63 144/69  Pulse: 91 105 102 84  Temp: 98.5 F (36.9 C) 98.1 F (36.7 C) 99 F (37.2 C) 98.2 F (36.8 C)  TempSrc: Oral Oral Oral Oral  Resp: Height:      Weight:      SpO2: 99% 95% 95% 97%   General:  No distress Lungs:  Clear Heart:  RRR Abdomen:  Positive bowel sounds, no rebound no guarding Extremities:  No edema  LABS:  Results for orders placed or performed during the hospital encounter of 05/08/15 (from the past 24 hour(s))  CBC     Status: Abnormal   Collection Time: 05/10/15  3:59 AM  Result Value Ref Range   WBC 7.6 4.0 - 10.5 K/uL   RBC 3.65 (L) 3.87 - 5.11 MIL/uL   Hemoglobin 10.4 (L) 12.0 - 15.0 g/dL   HCT 16.1 (L) 09.6 - 04.5 %   MCV 87.9 78.0 - 100.0 fL   MCH 28.5 26.0 - 34.0 pg   MCHC 32.4 30.0 - 36.0 g/dL   RDW 40.9 81.1 - 91.4 %   Platelets 323 150 - 400 K/uL  Basic metabolic panel     Status: Abnormal   Collection Time: 05/10/15  3:59 AM  Result Value Ref Range   Sodium 137 135 - 145 mmol/L   Potassium 4.2 3.5 - 5.1 mmol/L   Chloride 108 101 - 111 mmol/L   CO2 21 (L) 22 - 32 mmol/L   Glucose, Bld 83 65 - 99 mg/dL   BUN 8 6 - 20 mg/dL   Creatinine, Ser 7.82 0.44 - 1.00 mg/dL   Calcium 8.5 (L) 8.9 - 10.3 mg/dL   GFR calc non Af Amer >60 >60 mL/min   GFR calc Af Amer >60 >60 mL/min   Anion gap 8 5 - 15    Intake/Output Summary (Last 24 hours) at 05/10/15 0924 Last data filed at 05/10/15 0641  Gross per 24 hour  Intake   1480 ml  Output      0 ml  Net   1480 ml    ASSESSMENT AND PLAN:  PRESYNCOPE:  No evidence of pauses overnight again.  No indication for pacing.  OK to stop telemetry.  Please call with further questions.     Fayrene Fearing [facility] 05/10/2015 9:24 AM

## 2015-05-10 NOTE — Progress Notes (Signed)
Family Medicine Teaching Service Daily Progress Note Intern Pager: [phone]  [name] Medical record number: [id number] Date of birth: [birth year] Age: 81 y.o. Gender: female  Primary Care Provider: Uvaldo Rising, MD Consultants: None Code Status: Full  Pt Overview and Major Events to Date:  8/29: Admit for UTI, pre syncopal episode  Assessment and Plan:  Victoria Lewis is a 81 y.o. female presenting with pre- syncopal episodes. PMH is significant for history of syncopal episodes, HTN, CKD III, chronic cystitis, dementia, GERD, Anemia, Hiatal Hernia, and Arthritis.  Pre- syncopal episode- no further episodes of pre syncope since admission. Secondary to infection vs vasovagal response from coughing - D/c Telemetry and no indication for pacing per Cardiology - Cardiology following, appreciate recs   UTI- no fevers, WBC 7.6, stable - f/u BCx x2 NGTD, Repeat UCx not yet resulted - s/p Ceftriaxone 1g qD x2D. Transition to bactrim 8/31 to complete 7 day course of abx  HypoKalemia: K 3.3--> 4.2, Resolved after repletion  H/o DVT/CVA/H/o Parodoxical embolism - Stable - Continue home eliquis  HTN- Stable - Continue home norvasc  CKD III- Cr .81 , last Cr 03/07/2015 was 1.02, stable - trend BMPs  HLD-stable - continue atorvastatin  GERD- Stable - continue home protonix  Chronic Bck pain- stable - Continue home tylenol, gabapentin  FEN/GI: Regular diet, SLIV Prophylaxis: Eliquis 5 mg BID  Disposition: Anticipate D/c today to complete 7 day course of antibiotics  Subjective:  Pt reports she had a good night.  Per report she had one large bowel movement overnight but the nurse is unsure if it was formed , she has not had further bowel movements   Objective: Temp:  [98.1 F (36.7 C)-99 F (37.2 C)] 98.2 F (36.8 C) (08/31 0426) Pulse Rate:  [84-105] 84 (08/31 0426) Resp:  [16-17] 16 (08/31 0426) BP: (127-144)/(63-69) 144/69 mmHg (08/31  0426) SpO2:  [95 %-99 %] 97 % (08/31 0426) Physical Exam: General: NAD, lying in bed HEENT: EOMI, NCAT, MMM Cardiovascular: RRR, no m/r/g Respiratory: CTAB Abdomen: soft non tender, non distended, + BS Skin: round lesion approx 1 cm in diameter on chest Neuro: no focal deficits Psych: Pleasant, follows commands, AO x1 to person  Laboratory:  Recent Labs Lab 05/08/15 1144 05/09/15 0335 05/10/15 0359  WBC 12.5* 6.0 7.6  HGB 11.3* 9.5* 10.4*  HCT 34.1* 29.0* 32.1*  PLT 328 300 323    Recent Labs Lab 05/08/15 1144 05/09/15 0335 05/10/15 0359  NA 137 137 137  K 3.9 3.3* 4.2  CL 102 105 108  CO2 25 25 21*  BUN CREATININE 0.89 0.84 0.81  CALCIUM 9.3 8.3* 8.5*  GLUCOSE 103* 91 83      Victoria Connon Barbarann Ehlers, MD 05/10/2015, 9:44 AM PGY-2, [facility] FPTS Intern pager: [phone], text pages welcome

## 2015-05-11 LAB — URINE CULTURE

## 2015-05-12 ENCOUNTER — Emergency Department (HOSPITAL_COMMUNITY): Payer: Medicare Other

## 2015-05-12 ENCOUNTER — Emergency Department (HOSPITAL_COMMUNITY)
Admission: EM | Admit: 2015-05-12 | Discharge: 2015-05-13 | Disposition: A | Discharge status: Home / Self Care | Payer: Medicare Other | Attending: Emergency Medicine | Admitting: Emergency Medicine

## 2015-05-12 ENCOUNTER — Encounter (HOSPITAL_COMMUNITY): Payer: Self-pay | Admitting: Emergency Medicine

## 2015-05-12 DIAGNOSIS — Z872 Personal history of diseases of the skin and subcutaneous tissue: Secondary | ICD-10-CM | POA: Insufficient documentation

## 2015-05-12 DIAGNOSIS — I129 Hypertensive chronic kidney disease with stage 1 through stage 4 chronic kidney disease, or unspecified chronic kidney disease: Secondary | ICD-10-CM | POA: Diagnosis not present

## 2015-05-12 DIAGNOSIS — N183 Chronic kidney disease, stage 3 (moderate): Secondary | ICD-10-CM | POA: Diagnosis not present

## 2015-05-12 DIAGNOSIS — G8929 Other chronic pain: Secondary | ICD-10-CM | POA: Diagnosis not present

## 2015-05-12 DIAGNOSIS — Z87448 Personal history of other diseases of urinary system: Secondary | ICD-10-CM | POA: Diagnosis not present

## 2015-05-12 DIAGNOSIS — E785 Hyperlipidemia, unspecified: Secondary | ICD-10-CM | POA: Insufficient documentation

## 2015-05-12 DIAGNOSIS — M1990 Unspecified osteoarthritis, unspecified site: Secondary | ICD-10-CM | POA: Insufficient documentation

## 2015-05-12 DIAGNOSIS — Z7951 Long term (current) use of inhaled steroids: Secondary | ICD-10-CM | POA: Insufficient documentation

## 2015-05-12 DIAGNOSIS — K219 Gastro-esophageal reflux disease without esophagitis: Secondary | ICD-10-CM | POA: Insufficient documentation

## 2015-05-12 DIAGNOSIS — Z87440 Personal history of urinary (tract) infections: Secondary | ICD-10-CM | POA: Insufficient documentation

## 2015-05-12 DIAGNOSIS — D509 Iron deficiency anemia, unspecified: Secondary | ICD-10-CM | POA: Insufficient documentation

## 2015-05-12 DIAGNOSIS — F0390 Unspecified dementia without behavioral disturbance: Secondary | ICD-10-CM | POA: Diagnosis not present

## 2015-05-12 DIAGNOSIS — Z7901 Long term (current) use of anticoagulants: Secondary | ICD-10-CM | POA: Diagnosis not present

## 2015-05-12 DIAGNOSIS — Z792 Long term (current) use of antibiotics: Secondary | ICD-10-CM | POA: Insufficient documentation

## 2015-05-12 DIAGNOSIS — Z79899 Other long term (current) drug therapy: Secondary | ICD-10-CM | POA: Insufficient documentation

## 2015-05-12 DIAGNOSIS — R55 Syncope and collapse: Secondary | ICD-10-CM | POA: Insufficient documentation

## 2015-05-12 LAB — I-STAT TROPONIN, ED: Troponin i, poc: 0.01 ng/mL (ref 0.00–0.08)

## 2015-05-12 NOTE — ED Notes (Signed)
Her loop recorder representative, did not detect any irregular heart rhythm.

## 2015-05-12 NOTE — ED Notes (Signed)
Per [facility], Pt from home, husband states he was helping patient into bed, but pt got very weak, head slumped over, had a near syncopal episode. Husband able to set patient down. Fire got a BP 122/76 , HR 90 lying down. Sitting up 120/74. Pt unable to stand normally for long periods of time. Pt denies pain at this time. Pt released here Wednesday treated with IV rocephin for UTI, sent home with antibiotics. Pt supposed to be wearing a loop recorder. 12 lead unremarkable. Pt aware of who she is, unable to state the year. Doesn't remember the incident.

## 2015-05-12 NOTE — ED Notes (Signed)
Pt placed in a gown and hooked up to the monitor with a 5 lead, BP cuff and pulse ox 

## 2015-05-12 NOTE — ED Provider Notes (Signed)
CSN: [id number]     Arrival date & time 05/12/15  2209 History  This chart was scribed for Jerelyn Scott, MD by Freida Busman, ED Scribe. This patient was seen in room B14C/B14C and the patient's care was started 11:13 PM.    Chief Complaint  Patient presents with  . Near Syncope   LEVEL 5 CAVEAT DUE TO Dementia  Patient is a 81 y.o. female presenting with near-syncope. The history is provided by a relative (Daughter and Son ). No language interpreter was used.  Near Syncope This is a recurrent problem. The current episode started 1 to 2 hours ago. The problem has been gradually improving. The symptoms are relieved by lying down.     HPI Comments:  Victoria Lewis is a 81 y.o. female brought in by ambulance who presents to the Emergency Department with her daughter who reports a near syncopal episode this evening. Daughter states she found the pt slumped over but notes pt's eyes were open and she appeared to be having trouble breathing. Daughter called EMS who advised her to lay the pt on the ground and her breathing improved. Family states pt is eating and drinking as she normally would. Pt was seen in the ED on 05/08/15 after a near syncopal episode and was diagnosed with a UTI and placed on abx.   Past Medical History  Diagnosis Date  . Hypertension   . GERD (gastroesophageal reflux disease)   . H/O hiatal hernia   . Arthritis   . Syncope 07/13/2014  . Bladder infection, chronic     [name] 08/30/2014  . AV block, complete   . Chest pain   . Decreased dorsalis pedis pulse   . Hammer toe   . Hyperlipidemia   . Onychogryposis of toenail   . Osteoporosis   . Scoliosis   . Status post placement of implantable loop recorder   . Syncope   . Uterine disorder   . Diaphragmatic hernia   . Hx of epistaxis   . Chest pain   . Iron deficiency anemia   . Chronic back pain   . Dementia   . Chronic kidney disease (CKD), stage III (moderate)   . Recurrent UTI (urinary tract infection)    . UTI (urinary tract infection) 04/2015   Past Surgical History  Procedure Laterality Date  . Joint replacement    . Cataract extraction, bilateral Bilateral   . Total hip arthroplasty Right 10/11/2013    Procedure: TOTAL HIP ARTHROPLASTY;  Surgeon: Nestor Lewandowsky, MD;  Location: [facility] OR;  Service: Orthopedics;  Laterality: Right;  . Loop recorder implant    . Tubal ligation    . Total hip arthroplasty Left 05/2006    [name] 01/22/2011   Family History  Problem Relation Age of Onset  . Stroke Mother 94    died of stroke  . Hypertension Mother   . Heart disease Mother    Social History  Substance Use Topics  . Smoking status: Never Smoker   . Smokeless tobacco: Never Used  . Alcohol Use: No   OB History    No data available     Review of Systems  Unable to perform ROS: Dementia  Cardiovascular: Positive for near-syncope.   Allergies  Review of patient's allergies indicates no known allergies.  Home Medications   Prior to Admission medications   Medication Sig Start Date End Date Taking? Authorizing Provider  acetaminophen (TYLENOL) 500 MG tablet Take 1 tablet (500 mg total) by mouth  every 6 (six) hours as needed. Patient taking differently: Take 500 mg by mouth 4 (four) times daily.  09/01/14  Yes Glori Luis, MD  amLODipine (NORVASC) 10 MG tablet TAKE 1 TABLET (10 MG TOTAL) BY MOUTH DAILY. 01/16/15  Yes Duke Salvia, MD  apixaban (ELIQUIS) 5 MG TABS tablet Take 1 tablet (5 mg total) by mouth 2 (two) times daily. 04/11/15  Yes Uvaldo Rising, MD  atorvastatin (LIPITOR) 40 MG tablet TAKE 1 TABLET (40 MG TOTAL) BY MOUTH DAILY AT 6 PM. 01/16/15  Yes Uvaldo Rising, MD  cholecalciferol (VITAMIN D) 1000 UNITS tablet Take 1,000 Units by mouth daily.   Yes Historical Provider, MD  CRANBERRY PO Take 240 mLs by mouth 3 (three) times daily. Cranberry concentrate solution   Yes Historical Provider, MD  D-MANNOSE PO Take 1 tablet by mouth 2 (two) times daily.   Yes Historical Provider,  MD  fluticasone (FLONASE) 50 MCG/ACT nasal spray Place 2 sprays into both nostrils daily. Patient taking differently: Place 2 sprays into both nostrils daily as needed for allergies.  12/05/14  Yes Uvaldo Rising, MD  gabapentin (NEURONTIN) 100 MG capsule Take 100 mg by mouth 3 (three) times daily.  07/28/14  Yes Historical Provider, MD  nystatin (MYCOSTATIN/NYSTOP) 100000 UNIT/GM POWD Apply to affected area twice daily. 02/28/15  Yes Uvaldo Rising, MD  omeprazole (PRILOSEC) 20 MG capsule TAKE 1 CAPSULE (20 MG TOTAL) BY MOUTH DAILY. 05/08/15  Yes Uvaldo Rising, MD  Probiotic Product (ALIGN) 4 MG CAPS Take 4 mg by mouth daily.   Yes Historical Provider, MD  sulfamethoxazole-trimethoprim (BACTRIM DS,SEPTRA DS) 800-160 MG per tablet Take 1 tablet by mouth every 12 (twelve) hours. Patient taking differently: Take 1 tablet by mouth every 12 (twelve) hours. STARTED 05/10/15, FOR 7 DAYS ENDING 05/15/15 05/10/15 05/14/15 Yes Alyssa A Haney, MD  vitamin B-12 (CYANOCOBALAMIN) 1000 MCG tablet Take 1,000 mcg by mouth daily.   Yes Historical Provider, MD  Wheat Dextrin (BENEFIBER) POWD Take 1 Dose by mouth daily.   Yes Historical Provider, MD   BP 106/57 mmHg  Pulse 106  Temp(Src) 97.8 F (36.6 C) (Oral)  Resp 18  SpO2 96%  Vitals reviewed Physical Exam  Physical Examination: General appearance - alert, well appearing, and in no distress Mental status - alert, oriented to person, place, and time Eyes - pupils equal and reactive, extraocular eye movements intact Mouth - mucous membranes moist, pharynx normal without lesions Chest - clear to auscultation, no wheezes, rales or rhonchi, symmetric air entry Heart - normal rate, regular rhythm, normal S1, S2, no murmurs, rubs, clicks or gallops Abdomen - soft, nontender, nondistended, no masses or organomegaly Neurological - alert, oriented x1 which is her baseline, normal speech, strength 5/5 in extremities x 4, sensation intact Extremities - peripheral pulses  normal, no pedal edema, no clubbing or cyanosis Skin - normal coloration and turgor, no rashes  ED Course  Procedures   DIAGNOSTIC STUDIES:  Oxygen Saturation is 99% on RA, normal by my interpretation.    COORDINATION OF CARE:  11:18 PM Discussed treatment plan with family at bedside and they agreed to plan.  Labs Review Labs Reviewed  CBC - Abnormal; Notable for the following:    WBC 12.8 (*)    RBC 3.63 (*)    Hemoglobin 10.3 (*)    HCT 31.4 (*)    All other components within normal limits  BASIC METABOLIC PANEL - Abnormal; Notable for the following:  Sodium 131 (*)    Chloride 98 (*)    Creatinine, Ser 1.56 (*)    GFR calc non Af Amer 29 (*)    GFR calc Af Amer 34 (*)    All other components within normal limits  URINALYSIS, ROUTINE W REFLEX MICROSCOPIC (NOT AT [facility]) - Abnormal; Notable for the following:    Leukocytes, UA TRACE (*)    All other components within normal limits  URINE MICROSCOPIC-ADD ON  Rosezena Sensor, ED    Imaging Review Ct Head Wo Contrast  05/13/2015   CLINICAL DATA:  81 year old female with syncope and altered mental status today.  EXAM: CT HEAD WITHOUT CONTRAST  TECHNIQUE: Contiguous axial images were obtained from the base of the skull through the vertex without intravenous contrast.  COMPARISON:  12/19/2014 CT and MRI and prior studies.  FINDINGS: Atrophy and severe chronic small-vessel white matter ischemic changes again noted.  No acute intracranial abnormalities are identified, including mass lesion or mass effect, hydrocephalus, extra-axial fluid collection, midline shift, hemorrhage, or acute infarction.  A small amount of fluid within scattered paranasal sinuses again noted.  IMPRESSION: No evidence of acute intracranial abnormality.  Atrophy and severe chronic small-vessel white matter ischemic changes.  Unchanged sinus disease compatible with acute sinusitis.   Electronically Signed   By: Harmon Pier M.D.   On: 05/13/2015 00:40   I have  personally reviewed and evaluated these images and lab results as part of my medical decision-making.   EKG Interpretation   Date/Time:  Friday May 12 2015 22:11:26 EDT Ventricular Rate:  94 PR Interval:  182 QRS Duration: 81 QT Interval:  365 QTC Calculation: 456 R Axis:   1 Text Interpretation:  Sinus rhythm Low voltage, precordial leads ED  PHYSICIAN INTERPRETATION AVAILABLE IN [facility] Confirmed by TEST,  Record ([id number]) on 05/13/2015 11:50:40 AM      MDM   Final diagnoses:  Near syncope    Pt presenting with c/o near syncope at home.  Pt has been taking bactrim for recent diagnosis of UTI- urine culture reviewed in epic and urine is sensitive to bactrim.  Workup reassuring including labs and EKG.  D/w family and they are comfortable with taking patient home, daughter states that she has been worked up for syncope in the past.  Loop recorder interoggated and shows no abnormalities today.  UTI is improving.  Family is comfortable with discharge.  Close followup with PMD.  Discharged with strict return precautions.  Pt agreeable with plan.  I personally performed the services described in this documentation, which was scribed in my presence. The recorded information has been reviewed and is accurate.    Jerelyn Scott, MD 05/14/15 [phone]

## 2015-05-13 LAB — URINALYSIS, ROUTINE W REFLEX MICROSCOPIC
Bilirubin Urine: NEGATIVE
GLUCOSE, UA: NEGATIVE mg/dL
Hgb urine dipstick: NEGATIVE
KETONES UR: NEGATIVE mg/dL
NITRITE: NEGATIVE
PH: 6 (ref 5.0–8.0)
PROTEIN: NEGATIVE mg/dL
Specific Gravity, Urine: 1.008 (ref 1.005–1.030)
Urobilinogen, UA: 0.2 mg/dL (ref 0.0–1.0)

## 2015-05-13 LAB — CBC
HEMATOCRIT: 31.4 % — AB (ref 36.0–46.0)
HEMOGLOBIN: 10.3 g/dL — AB (ref 12.0–15.0)
MCH: 28.4 pg (ref 26.0–34.0)
MCHC: 32.8 g/dL (ref 30.0–36.0)
MCV: 86.5 fL (ref 78.0–100.0)
Platelets: 339 10*3/uL (ref 150–400)
RBC: 3.63 MIL/uL — AB (ref 3.87–5.11)
RDW: 15.2 % (ref 11.5–15.5)
WBC: 12.8 10*3/uL — AB (ref 4.0–10.5)

## 2015-05-13 LAB — BASIC METABOLIC PANEL
ANION GAP: 11 (ref 5–15)
BUN: 18 mg/dL (ref 6–20)
CHLORIDE: 98 mmol/L — AB (ref 101–111)
CO2: 22 mmol/L (ref 22–32)
Calcium: 9.5 mg/dL (ref 8.9–10.3)
Creatinine, Ser: 1.56 mg/dL — ABNORMAL HIGH (ref 0.44–1.00)
GFR calc Af Amer: 34 mL/min — ABNORMAL LOW (ref >60)
GFR, EST NON AFRICAN AMERICAN: 29 mL/min — AB (ref >60)
GLUCOSE: 95 mg/dL (ref 65–99)
POTASSIUM: 4.5 mmol/L (ref 3.5–5.1)
Sodium: 131 mmol/L — ABNORMAL LOW (ref 135–145)

## 2015-05-13 LAB — CULTURE, BLOOD (ROUTINE X 2)
Culture: NO GROWTH
Culture: NO GROWTH

## 2015-05-13 LAB — URINE MICROSCOPIC-ADD ON

## 2015-05-13 MED ORDER — SODIUM CHLORIDE 0.9 % IV BOLUS (SEPSIS)
500.0000 mL | Freq: Once | INTRAVENOUS | Status: AC
  Administered 2015-05-13: 500 mL via INTRAVENOUS

## 2015-05-13 NOTE — Discharge Instructions (Signed)
Return to the ED with any concerns including chest pain, difficulty breathing, fainting, vomiting and not able to keep down liquids, decreased level of alertness/lethargy, or any other alarming symptoms °

## 2015-05-13 NOTE — ED Notes (Signed)
Pt's daughter acknowledged all discharge orders and verbally confirmed understanding of all discharge instructions/follow-up

## 2015-05-13 NOTE — ED Notes (Signed)
Pt does not ambulate independently.  Family at bedside assisting with dressing and transition to wheelchair/car.

## 2015-05-19 ENCOUNTER — Ambulatory Visit (INDEPENDENT_AMBULATORY_CARE_PROVIDER_SITE_OTHER): Payer: Medicare Other | Admitting: Family Medicine

## 2015-05-19 DIAGNOSIS — E785 Hyperlipidemia, unspecified: Secondary | ICD-10-CM

## 2015-05-19 DIAGNOSIS — M25371 Other instability, right ankle: Secondary | ICD-10-CM

## 2015-05-19 DIAGNOSIS — I10 Essential (primary) hypertension: Secondary | ICD-10-CM

## 2015-05-19 DIAGNOSIS — N390 Urinary tract infection, site not specified: Secondary | ICD-10-CM | POA: Diagnosis not present

## 2015-05-19 DIAGNOSIS — I442 Atrioventricular block, complete: Secondary | ICD-10-CM

## 2015-05-19 NOTE — Assessment & Plan Note (Signed)
Daughter's questioning utility of Lipitor given advanced dementia. Patient does have history of CVA related to DVT with PFO. -after a discussion about the pros/cons it was decided to stop Lipitor (however family will check with cardiology first).

## 2015-05-19 NOTE — Assessment & Plan Note (Signed)
Right ankle instability causing difficulty with standing. Exam unremarkable. -prescription for lace up ankle brace provided

## 2015-05-19 NOTE — Assessment & Plan Note (Signed)
Hospital follow up for Klebsiella/Proteus UTI. Patient completed course of Bactrim and is currently asymptomatic. -family to monitor urinary symtpoms

## 2015-05-19 NOTE — Progress Notes (Signed)
   Subjective:    Patient ID: Victoria Lewis, female    DOB: 02-14-1928, 82 y.o.   MRN: [id number]  HPI 81 year old female presents for hospital follow-up. Accompanied by daughters who provided majority of the history.   Patient was discharged from the hospital on 05/09/2015. She was evaluated for syncope as well as Klebsiella/Proteus UTI. Reviewed discharge summaries and ED visit.   Syncope - evaluated by cardiology during hospitalization, found to have short episodes of asystole on telemetry, resolved with treatment for UTI, cardiology elected to not place pacemaker, repeat syncope on 9/2, evaluated in ED, patient was back to baseline, discharged in stable condition.   UTI - completed course of Bactrim, no dysuria, no abdominal pain, no foul smelling urine.   Right ankle instability - inverting of foot when she stands, wears soft ankle brace that does not provide enough support, no pain, no previous injury noted  HTN/HLD - daughters would like to know if she can stop Norvasc and Lipitor  Social - lives with husband, family recently staring having an aid come in the morning   Review of Systems  Constitutional: Negative for fever, chills and fatigue.  Respiratory: Negative for cough and shortness of breath.   Cardiovascular: Negative for chest pain.  Gastrointestinal: Negative for nausea, abdominal pain and diarrhea.       Objective:   Physical Exam Vitals: reviewed Gen: pleasant female, NAD, sitting in wheelchair Cardiac: RRR, S1 and S2 present, no murmur Resp: CTAB, normal effort Abd: soft, no tenderness, normal bowel sounds Ext: trace edema MSK: right ankle - no erythema or deformity, ROM to inversion/eversion/plantarflexion/dorsiflexion is full, no tenderness       Assessment & Plan:  Please see problem specific assessment and plan.

## 2015-05-19 NOTE — Assessment & Plan Note (Signed)
Well controlled.  -attempt trial off Norvasc as may be contributing to "syncopal episodes" -monitor home BP's -return in one month

## 2015-05-19 NOTE — Patient Instructions (Signed)
It was nice to see you today.  Please stop B12 and Norvasc. Check blood pressures at home and let me know if they start to go up (top number greater than 150, or bottom number greater than 90)  Ask the cardiologist about stopping Lipitor. I am on board with this.  Keep Eliquis until Novemer.   Take prescription for ankle brace to the [facility].

## 2015-05-19 NOTE — Assessment & Plan Note (Signed)
Patient recently hospitalized for syncopal episodes. Found to have intermittent asystole that resolved with treatment for UTI. Cardiology elected to not place pacemaker. - patient to follow up with cardiology

## 2015-05-26 ENCOUNTER — Ambulatory Visit: Payer: Medicare Other | Admitting: Family Medicine

## 2015-05-30 ENCOUNTER — Encounter: Payer: Self-pay | Admitting: Internal Medicine

## 2015-05-30 ENCOUNTER — Ambulatory Visit (INDEPENDENT_AMBULATORY_CARE_PROVIDER_SITE_OTHER): Payer: Medicare Other | Admitting: *Deleted

## 2015-05-30 DIAGNOSIS — R55 Syncope and collapse: Secondary | ICD-10-CM

## 2015-05-31 NOTE — Progress Notes (Signed)
Loop recorder 

## 2015-06-10 ENCOUNTER — Encounter (HOSPITAL_COMMUNITY): Payer: Self-pay | Admitting: *Deleted

## 2015-06-10 ENCOUNTER — Emergency Department (HOSPITAL_COMMUNITY)
Admission: EM | Admit: 2015-06-10 | Discharge: 2015-06-10 | Disposition: A | Discharge status: Home / Self Care | Payer: Medicare Other | Attending: Emergency Medicine | Admitting: Emergency Medicine

## 2015-06-10 DIAGNOSIS — Z5181 Encounter for therapeutic drug level monitoring: Secondary | ICD-10-CM | POA: Insufficient documentation

## 2015-06-10 DIAGNOSIS — Z8619 Personal history of other infectious and parasitic diseases: Secondary | ICD-10-CM | POA: Diagnosis not present

## 2015-06-10 DIAGNOSIS — N183 Chronic kidney disease, stage 3 (moderate): Secondary | ICD-10-CM | POA: Diagnosis not present

## 2015-06-10 DIAGNOSIS — Z79899 Other long term (current) drug therapy: Secondary | ICD-10-CM | POA: Insufficient documentation

## 2015-06-10 DIAGNOSIS — K219 Gastro-esophageal reflux disease without esophagitis: Secondary | ICD-10-CM | POA: Diagnosis not present

## 2015-06-10 DIAGNOSIS — Z8719 Personal history of other diseases of the digestive system: Secondary | ICD-10-CM | POA: Diagnosis not present

## 2015-06-10 DIAGNOSIS — M1990 Unspecified osteoarthritis, unspecified site: Secondary | ICD-10-CM | POA: Diagnosis not present

## 2015-06-10 DIAGNOSIS — I129 Hypertensive chronic kidney disease with stage 1 through stage 4 chronic kidney disease, or unspecified chronic kidney disease: Secondary | ICD-10-CM | POA: Diagnosis not present

## 2015-06-10 DIAGNOSIS — Z792 Long term (current) use of antibiotics: Secondary | ICD-10-CM | POA: Diagnosis not present

## 2015-06-10 DIAGNOSIS — M810 Age-related osteoporosis without current pathological fracture: Secondary | ICD-10-CM | POA: Insufficient documentation

## 2015-06-10 DIAGNOSIS — M419 Scoliosis, unspecified: Secondary | ICD-10-CM | POA: Insufficient documentation

## 2015-06-10 DIAGNOSIS — N3000 Acute cystitis without hematuria: Secondary | ICD-10-CM | POA: Diagnosis not present

## 2015-06-10 DIAGNOSIS — Z7984 Long term (current) use of oral hypoglycemic drugs: Secondary | ICD-10-CM | POA: Insufficient documentation

## 2015-06-10 DIAGNOSIS — F0390 Unspecified dementia without behavioral disturbance: Secondary | ICD-10-CM | POA: Insufficient documentation

## 2015-06-10 DIAGNOSIS — R4182 Altered mental status, unspecified: Secondary | ICD-10-CM | POA: Diagnosis present

## 2015-06-10 DIAGNOSIS — Z862 Personal history of diseases of the blood and blood-forming organs and certain disorders involving the immune mechanism: Secondary | ICD-10-CM | POA: Diagnosis not present

## 2015-06-10 DIAGNOSIS — Z87440 Personal history of urinary (tract) infections: Secondary | ICD-10-CM | POA: Diagnosis not present

## 2015-06-10 DIAGNOSIS — G8929 Other chronic pain: Secondary | ICD-10-CM | POA: Insufficient documentation

## 2015-06-10 DIAGNOSIS — E785 Hyperlipidemia, unspecified: Secondary | ICD-10-CM | POA: Insufficient documentation

## 2015-06-10 LAB — URINALYSIS W MICROSCOPIC (NOT AT ARMC)
Bilirubin Urine: NEGATIVE
GLUCOSE, UA: NEGATIVE mg/dL
KETONES UR: NEGATIVE mg/dL
NITRITE: POSITIVE — AB
PH: 6 (ref 5.0–8.0)
Protein, ur: NEGATIVE mg/dL
Specific Gravity, Urine: 1.009 (ref 1.005–1.030)
UROBILINOGEN UA: 0.2 mg/dL (ref 0.0–1.0)

## 2015-06-10 LAB — CBC
HCT: 32.6 % — ABNORMAL LOW (ref 36.0–46.0)
Hemoglobin: 10.6 g/dL — ABNORMAL LOW (ref 12.0–15.0)
MCH: 28.4 pg (ref 26.0–34.0)
MCHC: 32.5 g/dL (ref 30.0–36.0)
MCV: 87.4 fL (ref 78.0–100.0)
PLATELETS: 346 10*3/uL (ref 150–400)
RBC: 3.73 MIL/uL — AB (ref 3.87–5.11)
RDW: 14.8 % (ref 11.5–15.5)
WBC: 6.1 10*3/uL (ref 4.0–10.5)

## 2015-06-10 LAB — COMPREHENSIVE METABOLIC PANEL
ALK PHOS: 94 U/L (ref 38–126)
ALT: 14 U/L (ref 14–54)
ANION GAP: 9 (ref 5–15)
AST: 22 U/L (ref 15–41)
Albumin: 3.8 g/dL (ref 3.5–5.0)
BILIRUBIN TOTAL: 0.5 mg/dL (ref 0.3–1.2)
BUN: 11 mg/dL (ref 6–20)
CALCIUM: 9.3 mg/dL (ref 8.9–10.3)
CO2: 24 mmol/L (ref 22–32)
CREATININE: 0.97 mg/dL (ref 0.44–1.00)
Chloride: 100 mmol/L — ABNORMAL LOW (ref 101–111)
GFR, EST NON AFRICAN AMERICAN: 52 mL/min — AB (ref >60)
Glucose, Bld: 128 mg/dL — ABNORMAL HIGH (ref 65–99)
Potassium: 4.3 mmol/L (ref 3.5–5.1)
Sodium: 133 mmol/L — ABNORMAL LOW (ref 135–145)
TOTAL PROTEIN: 6.7 g/dL (ref 6.5–8.1)

## 2015-06-10 LAB — I-STAT CG4 LACTIC ACID, ED: Lactic Acid, Venous: 1.53 mmol/L (ref 0.5–2.0)

## 2015-06-10 LAB — LIPASE, BLOOD: Lipase: 33 U/L (ref 22–51)

## 2015-06-10 MED ORDER — SODIUM CHLORIDE 0.9 % IV BOLUS (SEPSIS)
500.0000 mL | Freq: Once | INTRAVENOUS | Status: AC
  Administered 2015-06-10: 500 mL via INTRAVENOUS

## 2015-06-10 MED ORDER — DEXTROSE 5 % IV SOLN
1.0000 g | Freq: Once | INTRAVENOUS | Status: AC
  Administered 2015-06-10: 1 g via INTRAVENOUS
  Filled 2015-06-10: qty 10

## 2015-06-10 MED ORDER — CEPHALEXIN 500 MG PO CAPS: 500.0000 mg | ORAL_CAPSULE | Freq: Two times a day (BID) | ORAL | Status: DC

## 2015-06-10 NOTE — ED Notes (Signed)
The pt also has a loop recorder

## 2015-06-10 NOTE — ED Notes (Signed)
Pt unable to sign, verbalized understanding of dc instructions and prescriptions

## 2015-06-10 NOTE — ED Notes (Signed)
The pts family thinks the pt has a uti  She has a history of the same and for several days she has been sleeping more than usual her urine has a foul odor and she does not appear to have any energy.  The pt is alert not confused .  She lives with her husband  Her daughter is with her

## 2015-06-10 NOTE — Discharge Instructions (Signed)
Return without fail for worsening symptoms, including fever, worsening confusion, vomiting unable to keep down food or fluids, or any other symptoms concerning to you.  Urinary Tract Infection Urinary tract infections (UTIs) can develop anywhere along your urinary tract. Your urinary tract is your body's drainage system for removing wastes and extra water. Your urinary tract includes two kidneys, two ureters, a bladder, and a urethra. Your kidneys are a pair of bean-shaped organs. Each kidney is about the size of your fist. They are located below your ribs, one on each side of your spine. CAUSES Infections are caused by microbes, which are microscopic organisms, including fungi, viruses, and bacteria. These organisms are so small that they can only be seen through a microscope. Bacteria are the microbes that most commonly cause UTIs. SYMPTOMS  Symptoms of UTIs may vary by age and gender of the patient and by the location of the infection. Symptoms in young women typically include a frequent and intense urge to urinate and a painful, burning feeling in the bladder or urethra during urination. Older women and men are more likely to be tired, shaky, and weak and have muscle aches and abdominal pain. A fever may mean the infection is in your kidneys. Other symptoms of a kidney infection include pain in your back or sides below the ribs, nausea, and vomiting. DIAGNOSIS To diagnose a UTI, your caregiver will ask you about your symptoms. Your caregiver also will ask to provide a urine sample. The urine sample will be tested for bacteria and white blood cells. White blood cells are made by your body to help fight infection. TREATMENT  Typically, UTIs can be treated with medication. Because most UTIs are caused by a bacterial infection, they usually can be treated with the use of antibiotics. The choice of antibiotic and length of treatment depend on your symptoms and the type of bacteria causing your  infection. HOME CARE INSTRUCTIONS  If you were prescribed antibiotics, take them exactly as your caregiver instructs you. Finish the medication even if you feel better after you have only taken some of the medication.  Drink enough water and fluids to keep your urine clear or pale yellow.  Avoid caffeine, tea, and carbonated beverages. They tend to irritate your bladder.  Empty your bladder often. Avoid holding urine for long periods of time.  Empty your bladder before and after sexual intercourse.  After a bowel movement, women should cleanse from front to back. Use each tissue only once. SEEK MEDICAL CARE IF:   You have back pain.  You develop a fever.  Your symptoms do not begin to resolve within 3 days. SEEK IMMEDIATE MEDICAL CARE IF:   You have severe back pain or lower abdominal pain.  You develop chills.  You have nausea or vomiting.  You have continued burning or discomfort with urination. MAKE SURE YOU:   Understand these instructions.  Will watch your condition.  Will get help right away if you are not doing well or get worse. Document Released: 06/05/2005 Document Revised: 02/25/2012 Document Reviewed: 10/04/2011 [facility] Patient Information 2015 [place], [place]. This information is not intended to replace advice given to you by your health care provider. Make sure you discuss any questions you have with your health care provider.

## 2015-06-10 NOTE — ED Provider Notes (Signed)
CSN: [id number]     Arrival date & time 06/10/15  1541 History   First MD Initiated Contact with Patient 06/10/15 1651     Chief Complaint  Patient presents with  . Recurrent UTI     (Consider location/radiation/quality/duration/timing/severity/associated sxs/prior Treatment)  Level 5 caveat due to dementia.    HPI 81 year old female with history of dementia who presents with foul-smelling urine and altered mental status. She is accompanied by her daughter who presents her history. States that for the past several days as a nurse's is notice that her urine is more foul-smelling than usual. She has been keeping well-hydrated and well-nourished during this time. Over the course of the past 2 days patient has been more sleepy and less talkative than usual. Her daughter states that this is consistent with her recurrent UTIs. Denies any fever, vomiting, diarrhea, complaints of pain, abd pain, cough, difficulty breathing.    Past Medical History  Diagnosis Date  . Hypertension   . GERD (gastroesophageal reflux disease)   . H/O hiatal hernia   . Arthritis   . Syncope 07/13/2014  . Bladder infection, chronic     [name] 08/30/2014  . AV block, complete (HCC)   . Chest pain   . Decreased dorsalis pedis pulse   . Hammer toe   . Hyperlipidemia   . Onychogryposis of toenail   . Osteoporosis   . Scoliosis   . Status post placement of implantable loop recorder   . Syncope   . Uterine disorder   . Diaphragmatic hernia   . Hx of epistaxis   . Chest pain   . Iron deficiency anemia   . Chronic back pain   . Dementia   . Chronic kidney disease (CKD), stage III (moderate)   . Recurrent UTI (urinary tract infection)   . UTI (urinary tract infection) 04/2015   Past Surgical History  Procedure Laterality Date  . Joint replacement    . Cataract extraction, bilateral Bilateral   . Total hip arthroplasty Right 10/11/2013    Procedure: TOTAL HIP ARTHROPLASTY;  Surgeon: Nestor Lewandowsky, MD;   Location: [facility] OR;  Service: Orthopedics;  Laterality: Right;  . Loop recorder implant    . Tubal ligation    . Total hip arthroplasty Left 05/2006    [name] 01/22/2011   Family History  Problem Relation Age of Onset  . Stroke Mother 67    died of stroke  . Hypertension Mother   . Heart disease Mother    Social History  Substance Use Topics  . Smoking status: Never Smoker   . Smokeless tobacco: Never Used  . Alcohol Use: No   OB History    No data available     Review of Systems  Unable to perform ROS: Dementia       Allergies  Review of patient's allergies indicates no known allergies.  Home Medications   Prior to Admission medications   Medication Sig Start Date End Date Taking? Authorizing Provider  acetaminophen (TYLENOL) 500 MG tablet Take 1 tablet (500 mg total) by mouth every 6 (six) hours as needed. Patient taking differently: Take 500 mg by mouth 4 (four) times daily.  09/01/14  Yes Glori Luis, MD  apixaban (ELIQUIS) 5 MG TABS tablet Take 1 tablet (5 mg total) by mouth 2 (two) times daily. 04/11/15  Yes Uvaldo Rising, MD  atorvastatin (LIPITOR) 40 MG tablet TAKE 1 TABLET (40 MG TOTAL) BY MOUTH DAILY AT 6 PM. 01/16/15  Yes Ronaldo Miyamoto  Genella Rife, MD  cholecalciferol (VITAMIN D) 1000 UNITS tablet Take 1,000 Units by mouth daily.   Yes Historical Provider, MD  CRANBERRY PO Take 240 mLs by mouth 3 (three) times daily. Cranberry concentrate solution   Yes Historical Provider, MD  D-MANNOSE PO Take 1 tablet by mouth 2 (two) times daily.   Yes Historical Provider, MD  fluticasone (FLONASE) 50 MCG/ACT nasal spray Place 2 sprays into both nostrils daily. Patient taking differently: Place 2 sprays into both nostrils daily as needed for allergies.  12/05/14  Yes Uvaldo Rising, MD  gabapentin (NEURONTIN) 100 MG capsule Take 100 mg by mouth 3 (three) times daily.  07/28/14  Yes Historical Provider, MD  nystatin (MYCOSTATIN/NYSTOP) 100000 UNIT/GM POWD Apply to affected area twice  daily. 02/28/15  Yes Uvaldo Rising, MD  omeprazole (PRILOSEC) 20 MG capsule TAKE 1 CAPSULE (20 MG TOTAL) BY MOUTH DAILY. 05/08/15  Yes Uvaldo Rising, MD  Probiotic Product (ALIGN) 4 MG CAPS Take 4 mg by mouth daily.   Yes Historical Provider, MD  Wheat Dextrin (BENEFIBER) POWD Take 1 Dose by mouth daily.   Yes Historical Provider, MD  cephALEXin (KEFLEX) 500 MG capsule Take 1 capsule (500 mg total) by mouth 2 (two) times daily. 06/10/15   Lavera Guise, MD   BP 173/76 mmHg  Pulse 80  Temp(Src) 97.3 F (36.3 C) (Oral)  Resp 16  SpO2 99% Physical Exam Physical Exam  Nursing note and vitals reviewed. Constitutional: Elderly woman, thin, but well developed, non-toxic, and in no acute distress Head: Normocephalic and atraumatic.  Mouth/Throat: Oropharynx is clear. Mucous membranes dry.  Neck: Normal range of motion. Neck supple.  Cardiovascular: Normal rate and regular rhythm. No edema.  Pulmonary/Chest: Effort normal and breath sounds normal.  Abdominal: Soft. There is no tenderness. There is no rebound and no guarding.  Musculoskeletal: Normal range of motion.  Neurological: Alert, no facial droop, fluent speech, moves all extremities symmetrically Skin: Skin is warm and dry.  Psychiatric: Cooperative  ED Course  Procedures (including critical care time) Labs Review Labs Reviewed  COMPREHENSIVE METABOLIC PANEL - Abnormal; Notable for the following:    Sodium 133 (*)    Chloride 100 (*)    Glucose, Bld 128 (*)    GFR calc non Af Amer 52 (*)    All other components within normal limits  CBC - Abnormal; Notable for the following:    RBC 3.73 (*)    Hemoglobin 10.6 (*)    HCT 32.6 (*)    All other components within normal limits  URINALYSIS W MICROSCOPIC - Abnormal; Notable for the following:    APPearance CLOUDY (*)    Hgb urine dipstick TRACE (*)    Nitrite POSITIVE (*)    Leukocytes, UA LARGE (*)    Bacteria, UA MANY (*)    All other components within normal limits  URINE  CULTURE  LIPASE, BLOOD  I-STAT CG4 LACTIC ACID, ED  I-STAT CG4 LACTIC ACID, ED    I have personally reviewed and evaluated these images and lab results as part of my medical decision-making.   MDM   Final diagnoses:  Acute cystitis without hematuria    81 year old female with history of dementia who presents with altered mental status and foul-smelling urine. She is nontoxic in no acute distress. Vital signs overall non-concerning. She is alert, and answers simple questions appropriately and is grossly neurologically intact. Abdomen is benign and cardiopulmonary exam is unremarkable. Catheterized urine is notable for bacteria,  leukocyte esterase, nitrates, and wbc's consistent with UTI. Family states that this is her typical presentation for acute cystitis, and she is given 1 g of ceftriaxone. Urine is cultured. Basic blood work not concerning for leukocytosis, significant AKI, or lactic acidosis. No concerns for sepsis at this time. She is prescribed course of Keflex, and has plans for outpatient follow-up this next week with her PCP. Strict return instructions are reviewed with the patient's daughter, who expressed understanding of all discharge instructions for comfortable to plan of care.    Lavera Guise, MD 06/10/15 2015

## 2015-06-12 LAB — URINE CULTURE

## 2015-06-14 ENCOUNTER — Encounter: Payer: Self-pay | Admitting: Internal Medicine

## 2015-06-14 ENCOUNTER — Ambulatory Visit (INDEPENDENT_AMBULATORY_CARE_PROVIDER_SITE_OTHER): Payer: Medicare Other | Admitting: Internal Medicine

## 2015-06-14 VITALS — BP 132/70 | HR 84 | Ht 62.0 in | Wt 135.0 lb

## 2015-06-14 DIAGNOSIS — R55 Syncope and collapse: Secondary | ICD-10-CM

## 2015-06-14 DIAGNOSIS — I10 Essential (primary) hypertension: Secondary | ICD-10-CM | POA: Diagnosis not present

## 2015-06-14 NOTE — Progress Notes (Signed)
Patient Care Team: Uvaldo Rising, MD as PCP - General (Family Medicine) Duke Salvia, MD as Consulting Physician (Cardiology) Esaw Dace, MD as Attending Physician (Urology) Maris Berger, MD as Consulting Physician (Ophthalmology) Harmon Dun, MD as Consulting Physician (Otolaryngology) Francoise Schaumann. Kaylyn Layer, DPM (Podiatry) Gean Birchwood, MD as Consulting Physician (Orthopedic Surgery) Neysa Bonito, MD (Psychiatry)   HPI  Victoria Lewis is a 81 y.o. female Seen in follow-up for syncope in the context of a previously implanted loop recorder this had demonstrated PP prolongation associated with complete heart block.  Loop recorder had been implanted previously at [facility].    No recurrent syncope  Records and Results Reviewed incl MRI>> pror stroke  No hx of afib  Has been on apixoban for DVT  Past Medical History  Diagnosis Date  . Hypertension   . GERD (gastroesophageal reflux disease)   . H/O hiatal hernia   . Arthritis   . Syncope 07/13/2014  . Bladder infection, chronic     [name] 08/30/2014  . AV block, complete (HCC)   . Chest pain   . Decreased dorsalis pedis pulse   . Hammer toe   . Hyperlipidemia   . Onychogryposis of toenail   . Osteoporosis   . Scoliosis   . Status post placement of implantable loop recorder   . Syncope   . Uterine disorder   . Diaphragmatic hernia   . Hx of epistaxis   . Chest pain   . Iron deficiency anemia   . Chronic back pain   . Dementia   . Chronic kidney disease (CKD), stage III (moderate)   . Recurrent UTI (urinary tract infection)   . UTI (urinary tract infection) 04/2015    Past Surgical History  Procedure Laterality Date  . Joint replacement    . Cataract extraction, bilateral Bilateral   . Total hip arthroplasty Right 10/11/2013    Procedure: TOTAL HIP ARTHROPLASTY;  Surgeon: Nestor Lewandowsky, MD;  Location: [facility] OR;  Service: Orthopedics;  Laterality: Right;  . Loop recorder implant    .  Tubal ligation    . Total hip arthroplasty Left 05/2006    [name] 01/22/2011    Current Outpatient Prescriptions  Medication Sig Dispense Refill  . acetaminophen (TYLENOL) 500 MG tablet Take 1 tablet (500 mg total) by mouth every 6 (six) hours as needed. (Patient taking differently: Take 500 mg by mouth 4 (four) times daily. ) 30 tablet 0  . apixaban (ELIQUIS) 5 MG TABS tablet Take 1 tablet (5 mg total) by mouth 2 (two) times daily. 60 tablet 2  . atorvastatin (LIPITOR) 40 MG tablet TAKE 1 TABLET (40 MG TOTAL) BY MOUTH DAILY AT 6 PM. 90 tablet 1  . cephALEXin (KEFLEX) 500 MG capsule Take 1 capsule (500 mg total) by mouth 2 (two) times daily. 20 capsule 0  . cholecalciferol (VITAMIN D) 1000 UNITS tablet Take 1,000 Units by mouth daily.    Marland Kitchen CRANBERRY PO Take 240 mLs by mouth 3 (three) times daily. Cranberry concentrate solution    . D-MANNOSE PO Take 1 tablet by mouth 2 (two) times daily.    . fluticasone (FLONASE) 50 MCG/ACT nasal spray Place 2 sprays into both nostrils daily. (Patient taking differently: Place 2 sprays into both nostrils daily as needed for allergies. ) 16 g 6  . gabapentin (NEURONTIN) 100 MG capsule Take 100 mg by mouth 3 (three) times daily.   2  . nystatin (MYCOSTATIN/NYSTOP) 100000  UNIT/GM POWD Apply to affected area twice daily. 60 g 1  . omeprazole (PRILOSEC) 20 MG capsule TAKE 1 CAPSULE (20 MG TOTAL) BY MOUTH DAILY. 90 capsule 1  . Probiotic Product (ALIGN) 4 MG CAPS Take 4 mg by mouth daily.    . Wheat Dextrin (BENEFIBER) POWD Take 1 Dose by mouth daily.     No current facility-administered medications for this visit.    No Known Allergies    Review of Systems negative except from HPI and PMH  Physical Exam BP 132/70 mmHg  Pulse 84  Ht  (1.575 m)  Wt 135 lb (61.236 kg)  BMI 24.69 kg/m2  SpO2 99% Well developed and well nourished in no acute distress HENT normal E scleral and icterus clear Neck Supple JVP flat; carotids brisk and full Clear to  ausculation  Regular rate and rhythm, no murmurs gallops or rub Soft with active bowel sounds No clubbing cyanosis  Edema Alert agrossly normal motor and sensory function Skin Warm and Dry    Assessment and  Plan  Syncope  LINQ  Stroke    no intercurrent stroke or syncope  i am not inclined to continue her statin  Given her age and comorbidities;  The SPARCL trial showed an absolute risk reduction of 2% over 5 years   The family does not find that benefit warranting of therapy  Without evidence of atrial fib i see no need for apixoban   Her BP is quite high and her syncope while assoc with low BP is presumed based on egram recording to be neurally mediated but not 2/2 hypotension per se, hence would treat her hypertension  Seems reasonable to me to resume her amlodipine

## 2015-06-14 NOTE — Patient Instructions (Signed)
Medication Instructions: - no changes  Labwork: - none  Procedures/Testing: - none  Follow-Up: - Dr. Klein will see you back on an as needed basis.  Any Additional Special Instructions Will Be Listed Below (If Applicable). - none  

## 2015-06-15 LAB — CUP PACEART INCLINIC DEVICE CHECK: MDC IDC SESS DTM: 20161006091940

## 2015-06-16 ENCOUNTER — Encounter: Payer: Self-pay | Admitting: Family Medicine

## 2015-06-16 ENCOUNTER — Ambulatory Visit (INDEPENDENT_AMBULATORY_CARE_PROVIDER_SITE_OTHER): Payer: Medicare Other | Admitting: Family Medicine

## 2015-06-16 VITALS — BP 152/88 | HR 89 | Temp 98.6°F | Ht 62.0 in

## 2015-06-16 DIAGNOSIS — I639 Cerebral infarction, unspecified: Secondary | ICD-10-CM

## 2015-06-16 DIAGNOSIS — N390 Urinary tract infection, site not specified: Secondary | ICD-10-CM | POA: Diagnosis not present

## 2015-06-16 DIAGNOSIS — I10 Essential (primary) hypertension: Secondary | ICD-10-CM

## 2015-06-16 DIAGNOSIS — Z23 Encounter for immunization: Secondary | ICD-10-CM

## 2015-06-16 MED ORDER — AMLODIPINE BESYLATE 2.5 MG PO TABS: 2.5000 mg | ORAL_TABLET | Freq: Every day | ORAL | Status: DC

## 2015-06-16 NOTE — Assessment & Plan Note (Signed)
Blood pressure mildly elevated. -Restart Norvasc 2.5 mg daily -Recheck blood pressure in one month.

## 2015-06-16 NOTE — Assessment & Plan Note (Addendum)
Patient currently under treatment for UTI diagnosed on 06/10/2015 in the emergency room. Urine culture grew greater than 100,000 colonies of Escherichia coli. Sensitive to Cephalosporins. She was responding to Keflex. -Continue current management

## 2015-06-16 NOTE — Progress Notes (Signed)
   Subjective:    Patient ID: Victoria Lewis, female    DOB: 24-Feb-1928, 81 y.o.   MRN: [id number]  HPI 81 year old female presents for routine follow-up. She is accompanied by her husband and her son.  06/10/15 - seen in the emergency room for increasing weakness, decreased appetite, and follow urine. Diagnosed with urinary tract infection. Received Rocephin 1 and is currently completing a course of Keflex. Symptoms are resolving. Appetite is improved.  06/14/15 - seen by her cardiologist Dr. Graciela Husbands, recommended to discontinue Lipitor and Eluquis (greater than 6 months from CVA due to DVT), also it was recommended that the patient restart Norvasc.  The patient is unable to provide significant past medical history secondary to dementia.  Social-patient lives with her husband, nonsmoker   Review of Systems  Constitutional: Negative for fever, chills and fatigue.  Respiratory: Negative for shortness of breath.   Cardiovascular: Negative for chest pain and leg swelling.  Gastrointestinal: Negative for nausea and diarrhea.       Objective:   Physical Exam Vitals: Reviewed Gen.: Pleasant female, no acute distress, sitting in wheelchair Cardiac: Regular rate and rhythm, S1 and S2 present, no murmurs, no heaves or thrills Respiratory: Clear to patient bilaterally, normal effort Abdomen: Soft, nontender, bowel sounds present Extremities: Trace edema was in bilateral lower extremities  Reviewed recent emergency room note from 06/10/2015 and cardiology note from 06/14/2015.     Assessment & Plan:  No problem-specific assessment & plan notes found for this encounter.

## 2015-06-16 NOTE — Patient Instructions (Signed)
It was nice to see you today.  Please stop the Lipitor and Eliquis.  Restart low dose Norvasc (2.5 mg daily).  Medication list has been updated. Flu and pneumonia shot given today.   Return in one month.

## 2015-06-16 NOTE — Assessment & Plan Note (Signed)
Patient has completed 6 months of anticoagulation without recurrent stroke symptoms. -Discontinue Eliquis today -After discussion with the family was decided to discontinue Lipitor

## 2015-06-19 ENCOUNTER — Encounter: Payer: Self-pay | Admitting: Internal Medicine

## 2015-06-25 ENCOUNTER — Emergency Department (HOSPITAL_COMMUNITY): Payer: Medicare Other

## 2015-06-25 ENCOUNTER — Encounter (HOSPITAL_COMMUNITY): Payer: Self-pay | Admitting: Emergency Medicine

## 2015-06-25 ENCOUNTER — Emergency Department (HOSPITAL_COMMUNITY)
Admission: EM | Admit: 2015-06-25 | Discharge: 2015-06-25 | Disposition: A | Discharge status: Home / Self Care | Payer: Medicare Other | Attending: Emergency Medicine | Admitting: Emergency Medicine

## 2015-06-25 DIAGNOSIS — R5383 Other fatigue: Secondary | ICD-10-CM | POA: Diagnosis present

## 2015-06-25 DIAGNOSIS — G8929 Other chronic pain: Secondary | ICD-10-CM | POA: Diagnosis not present

## 2015-06-25 DIAGNOSIS — Z79899 Other long term (current) drug therapy: Secondary | ICD-10-CM | POA: Diagnosis not present

## 2015-06-25 DIAGNOSIS — K219 Gastro-esophageal reflux disease without esophagitis: Secondary | ICD-10-CM | POA: Diagnosis not present

## 2015-06-25 DIAGNOSIS — Z862 Personal history of diseases of the blood and blood-forming organs and certain disorders involving the immune mechanism: Secondary | ICD-10-CM | POA: Insufficient documentation

## 2015-06-25 DIAGNOSIS — F0390 Unspecified dementia without behavioral disturbance: Secondary | ICD-10-CM | POA: Insufficient documentation

## 2015-06-25 DIAGNOSIS — N183 Chronic kidney disease, stage 3 (moderate): Secondary | ICD-10-CM | POA: Insufficient documentation

## 2015-06-25 DIAGNOSIS — I129 Hypertensive chronic kidney disease with stage 1 through stage 4 chronic kidney disease, or unspecified chronic kidney disease: Secondary | ICD-10-CM | POA: Diagnosis not present

## 2015-06-25 DIAGNOSIS — N390 Urinary tract infection, site not specified: Secondary | ICD-10-CM | POA: Diagnosis not present

## 2015-06-25 DIAGNOSIS — Z8639 Personal history of other endocrine, nutritional and metabolic disease: Secondary | ICD-10-CM | POA: Diagnosis not present

## 2015-06-25 DIAGNOSIS — M419 Scoliosis, unspecified: Secondary | ICD-10-CM | POA: Diagnosis not present

## 2015-06-25 DIAGNOSIS — Z872 Personal history of diseases of the skin and subcutaneous tissue: Secondary | ICD-10-CM | POA: Insufficient documentation

## 2015-06-25 LAB — URINALYSIS, ROUTINE W REFLEX MICROSCOPIC
Bilirubin Urine: NEGATIVE
Glucose, UA: NEGATIVE mg/dL
Ketones, ur: NEGATIVE mg/dL
NITRITE: POSITIVE — AB
PH: 6.5 (ref 5.0–8.0)
Protein, ur: NEGATIVE mg/dL
SPECIFIC GRAVITY, URINE: 1.008 (ref 1.005–1.030)
UROBILINOGEN UA: 0.2 mg/dL (ref 0.0–1.0)

## 2015-06-25 LAB — CBC WITH DIFFERENTIAL/PLATELET
BASOS ABS: 0.1 10*3/uL (ref 0.0–0.1)
Basophils Relative: 1 %
EOS ABS: 0.4 10*3/uL (ref 0.0–0.7)
EOS PCT: 7 %
HCT: 35.5 % — ABNORMAL LOW (ref 36.0–46.0)
HEMOGLOBIN: 11.3 g/dL — AB (ref 12.0–15.0)
LYMPHS ABS: 0.8 10*3/uL (ref 0.7–4.0)
Lymphocytes Relative: 15 %
MCH: 27.2 pg (ref 26.0–34.0)
MCHC: 31.8 g/dL (ref 30.0–36.0)
MCV: 85.5 fL (ref 78.0–100.0)
Monocytes Absolute: 0.6 10*3/uL (ref 0.1–1.0)
Monocytes Relative: 12 %
NEUTROS PCT: 65 %
Neutro Abs: 3.4 10*3/uL (ref 1.7–7.7)
PLATELETS: 328 10*3/uL (ref 150–400)
RBC: 4.15 MIL/uL (ref 3.87–5.11)
RDW: 15 % (ref 11.5–15.5)
WBC: 5.2 10*3/uL (ref 4.0–10.5)

## 2015-06-25 LAB — I-STAT TROPONIN, ED: Troponin i, poc: 0.01 ng/mL (ref 0.00–0.08)

## 2015-06-25 LAB — COMPREHENSIVE METABOLIC PANEL
ALBUMIN: 3.8 g/dL (ref 3.5–5.0)
ALK PHOS: 90 U/L (ref 38–126)
ALT: 13 U/L — AB (ref 14–54)
AST: 24 U/L (ref 15–41)
Anion gap: 9 (ref 5–15)
BUN: 9 mg/dL (ref 6–20)
CALCIUM: 9.6 mg/dL (ref 8.9–10.3)
CHLORIDE: 100 mmol/L — AB (ref 101–111)
CO2: 26 mmol/L (ref 22–32)
CREATININE: 1.04 mg/dL — AB (ref 0.44–1.00)
GFR calc non Af Amer: 48 mL/min — ABNORMAL LOW (ref >60)
GFR, EST AFRICAN AMERICAN: 55 mL/min — AB (ref >60)
GLUCOSE: 92 mg/dL (ref 65–99)
Potassium: 4.5 mmol/L (ref 3.5–5.1)
SODIUM: 135 mmol/L (ref 135–145)
Total Bilirubin: 0.6 mg/dL (ref 0.3–1.2)
Total Protein: 6.7 g/dL (ref 6.5–8.1)

## 2015-06-25 LAB — URINE MICROSCOPIC-ADD ON

## 2015-06-25 LAB — BRAIN NATRIURETIC PEPTIDE: B Natriuretic Peptide: 36.7 pg/mL (ref 0.0–100.0)

## 2015-06-25 MED ORDER — SODIUM CHLORIDE 0.9 % IV BOLUS (SEPSIS)
500.0000 mL | Freq: Once | INTRAVENOUS | Status: AC
  Administered 2015-06-25: 500 mL via INTRAVENOUS

## 2015-06-25 MED ORDER — CEPHALEXIN 500 MG PO CAPS: 500.0000 mg | ORAL_CAPSULE | Freq: Four times a day (QID) | ORAL | Status: DC

## 2015-06-25 MED ORDER — DEXTROSE 5 % IV SOLN
1.0000 g | Freq: Once | INTRAVENOUS | Status: AC
  Administered 2015-06-25: 1 g via INTRAVENOUS
  Filled 2015-06-25: qty 10

## 2015-06-25 NOTE — ED Notes (Addendum)
Pt daughter reports that the pt was recently diagnosed with a UTI and has finished her course of antibiotics. Pt has been acting tired and not speaking like usual. Pts daughter thinks she may still have a UTI. NAD at this time. Pts daughter also reports almost passing out Monday and Thursday.

## 2015-06-25 NOTE — ED Provider Notes (Signed)
CSN: [id number]     Arrival date & time 06/25/15  0911 History   First MD Initiated Contact with Patient 06/25/15 [phone]     Chief Complaint  Patient presents with  . Fatigue     (Consider location/radiation/quality/duration/timing/severity/associated sxs/prior Treatment) Patient is a 81 y.o. female presenting with weakness. The history is provided by a relative (The daughter states the patient has been weak for a few days now. She has a known history of getting urinary tract infections).  Weakness This is a recurrent problem. The current episode started 2 days ago. The problem occurs constantly. The problem has not changed since onset.Pertinent negatives include no chest pain, no abdominal pain and no headaches. Nothing aggravates the symptoms. Nothing relieves the symptoms.    Past Medical History  Diagnosis Date  . Hypertension   . GERD (gastroesophageal reflux disease)   . H/O hiatal hernia   . Arthritis   . Syncope 07/13/2014  . Bladder infection, chronic     [name] 08/30/2014  . AV block, complete (HCC)   . Chest pain   . Decreased dorsalis pedis pulse   . Hammer toe   . Hyperlipidemia   . Onychogryposis of toenail   . Osteoporosis   . Scoliosis   . Status post placement of implantable loop recorder   . Syncope   . Uterine disorder   . Diaphragmatic hernia   . Hx of epistaxis   . Chest pain   . Iron deficiency anemia   . Chronic back pain   . Dementia   . Chronic kidney disease (CKD), stage III (moderate)   . Recurrent UTI (urinary tract infection)   . UTI (urinary tract infection) 04/2015   Past Surgical History  Procedure Laterality Date  . Joint replacement    . Cataract extraction, bilateral Bilateral   . Total hip arthroplasty Right 10/11/2013    Procedure: TOTAL HIP ARTHROPLASTY;  Surgeon: Nestor Lewandowsky, MD;  Location: [facility] OR;  Service: Orthopedics;  Laterality: Right;  . Loop recorder implant    . Tubal ligation    . Total hip arthroplasty Left 05/2006     [name] 01/22/2011   Family History  Problem Relation Age of Onset  . Stroke Mother 83    died of stroke  . Hypertension Mother   . Heart disease Mother    Social History  Substance Use Topics  . Smoking status: Never Smoker   . Smokeless tobacco: Never Used  . Alcohol Use: No   OB History    No data available     Review of Systems  Constitutional: Negative for appetite change and fatigue.  HENT: Negative for congestion, ear discharge and sinus pressure.   Eyes: Negative for discharge.  Respiratory: Negative for cough.   Cardiovascular: Negative for chest pain.  Gastrointestinal: Negative for abdominal pain and diarrhea.  Genitourinary: Negative for frequency and hematuria.  Musculoskeletal: Negative for back pain.  Skin: Negative for rash.  Neurological: Positive for weakness. Negative for seizures and headaches.  Psychiatric/Behavioral: Negative for hallucinations.      Allergies  Review of patient's allergies indicates no known allergies.  Home Medications   Prior to Admission medications   Medication Sig Start Date End Date Taking? Authorizing Provider  acetaminophen (TYLENOL) 500 MG tablet Take 1 tablet (500 mg total) by mouth every 6 (six) hours as needed. Patient taking differently: Take 500 mg by mouth 4 (four) times daily.  09/01/14  Yes Glori Luis, MD  amLODipine (NORVASC) 2.5  MG tablet Take 1 tablet (2.5 mg total) by mouth daily. 06/16/15  Yes Uvaldo RisingKyle J Fletke, MD  cholecalciferol (VITAMIN D) 1000 UNITS tablet Take 1,000 Units by mouth daily.   Yes Historical Provider, MD  CRANBERRY PO Take 240 mLs by mouth 3 (three) times daily. Cranberry concentrate solution   Yes Historical Provider, MD  D-MANNOSE PO Take 1 tablet by mouth 2 (two) times daily.   Yes Historical Provider, MD  fluticasone (FLONASE) 50 MCG/ACT nasal spray Place 2 sprays into both nostrils daily. Patient taking differently: Place 2 sprays into both nostrils daily as needed for allergies.   12/05/14  Yes Uvaldo RisingKyle J Fletke, MD  gabapentin (NEURONTIN) 100 MG capsule Take 100 mg by mouth 3 (three) times daily.  07/28/14  Yes Historical Provider, MD  nystatin (MYCOSTATIN/NYSTOP) 100000 UNIT/GM POWD Apply to affected area twice daily. Patient taking differently: Apply 1 g topically daily as needed (affected areas). Apply to affected area twice daily. 02/28/15  Yes Uvaldo RisingKyle J Fletke, MD  omeprazole (PRILOSEC) 20 MG capsule TAKE 1 CAPSULE (20 MG TOTAL) BY MOUTH DAILY. 05/08/15  Yes Uvaldo RisingKyle J Fletke, MD  Probiotic Product (ALIGN) 4 MG CAPS Take 4 mg by mouth daily.   Yes Historical Provider, MD  Wheat Dextrin (BENEFIBER) POWD Take 1 Dose by mouth daily.   Yes Historical Provider, MD  cephALEXin (KEFLEX) 500 MG capsule Take 1 capsule (500 mg total) by mouth 4 (four) times daily. 06/25/15   Bethann BerkshireJoseph Tiffanny Lamarche, MD   BP 178/78 mmHg  Pulse 77  Temp(Src) 97.5 F (36.4 C) (Oral)  Resp 13  SpO2 100% Physical Exam  Constitutional: She appears well-developed.  HENT:  Head: Normocephalic.  Eyes: Conjunctivae and EOM are normal. No scleral icterus.  Neck: Neck supple. No thyromegaly present.  Cardiovascular: Normal rate and regular rhythm.  Exam reveals no gallop and no friction rub.   No murmur heard. Pulmonary/Chest: No stridor. She has no wheezes. She has no rales. She exhibits no tenderness.  Abdominal: She exhibits no distension. There is no tenderness. There is no rebound.  Musculoskeletal: Normal range of motion. She exhibits no edema.  Lymphadenopathy:    She has no cervical adenopathy.  Neurological: She exhibits normal muscle tone. Coordination normal.  Patient is mildly lethargic. She does not complain of any pain. Will follow commands. But is only oriented to person. Patient does have a history of dementia  Skin: No rash noted. No erythema.  Psychiatric: She has a normal mood and affect. Her behavior is normal.    ED Course  Procedures (including critical care time) Labs Review Labs Reviewed   CBC WITH DIFFERENTIAL/PLATELET - Abnormal; Notable for the following:    Hemoglobin 11.3 (*)    HCT 35.5 (*)    All other components within normal limits  COMPREHENSIVE METABOLIC PANEL - Abnormal; Notable for the following:    Chloride 100 (*)    Creatinine, Ser 1.04 (*)    ALT 13 (*)    GFR calc non Af Amer 48 (*)    GFR calc Af Amer 55 (*)    All other components within normal limits  URINALYSIS, ROUTINE W REFLEX MICROSCOPIC (NOT AT [facility]) - Abnormal; Notable for the following:    APPearance CLOUDY (*)    Hgb urine dipstick TRACE (*)    Nitrite POSITIVE (*)    Leukocytes, UA LARGE (*)    All other components within normal limits  URINE MICROSCOPIC-ADD ON - Abnormal; Notable for the following:    Bacteria,  UA MANY (*)    All other components within normal limits  URINE CULTURE  BRAIN NATRIURETIC PEPTIDE  I-STAT TROPOININ, ED    Imaging Review Dg Chest Port 1 View  06/25/2015  CLINICAL DATA:  81 year old female with urinary tract infection and persistent cough. EXAM: PORTABLE CHEST 1 VIEW COMPARISON:  Chest x-ray 08/07/2014. FINDINGS: Lung volumes are low. Chronic elevation of the right hemidiaphragm is unchanged. No acute consolidative airspace disease. No pleural effusions. No pneumothorax. No suspicious appearing pulmonary nodules or masses. Retrocardiac density compatible with a large hiatal hernia. Heart size is normal. Upper mediastinal contours are within normal limits allowing for patient positioning. Atherosclerosis in the thoracic aorta. IMPRESSION: 1. Low lung volumes without radiographic evidence of acute cardiopulmonary disease. 2. Chronic elevation of the right hemidiaphragm is unchanged. 3. Large hiatal hernia. 4. Atherosclerosis. Electronically Signed   By: Trudie Reed M.D.   On: 06/25/2015 10:19   I have personally reviewed and evaluated these images and lab results as part of my medical decision-making.   EKG Interpretation None      MDM   Final  diagnoses:  UTI (lower urinary tract infection)   Labs unremarkable except for UTI. Patient given Rocephin in the emergency department will be prescribed Keflex and will follow-up with PCP in 2-3 days    Bethann Berkshire, MD 06/25/15 1256

## 2015-06-25 NOTE — Discharge Instructions (Signed)
Follow up with your md this week. °

## 2015-06-26 ENCOUNTER — Telehealth: Payer: Self-pay | Admitting: Family Medicine

## 2015-06-26 NOTE — Telephone Encounter (Signed)
Pt daughter calling and states that Keflex given to pt for UTI; went to ED for UTI and was given more Keflex. Pt daughter concerned because the pt's condition is not getting any better. Would like to speak to provider concerning this issue at the earliest convenience. Sadie Reynolds, ASA

## 2015-06-27 LAB — CUP PACEART REMOTE DEVICE CHECK: MDC IDC SESS DTM: 20160921003804

## 2015-06-27 LAB — URINE CULTURE
Culture: >=100000
Special Requests: NORMAL

## 2015-06-27 NOTE — Telephone Encounter (Signed)
Spoke with daughter, her mother ([name]) is doing much better this AM, able to feed self, back to normal activity, has follow up on Thursday, told her that if her mother continues to do well no need to keep appointment, she will call on 10/19 to cancel if mother continues to do well

## 2015-06-27 NOTE — Progress Notes (Signed)
Carelink summary report received. Battery status OK. Normal device function. No new symptom episodes, tachy episodes, or brady. 1 pause episode---appropriate, intermittent CHB. No new AF episodes. Monthly summary reports and ROV w/ SK 06/14/15.

## 2015-06-28 ENCOUNTER — Ambulatory Visit (INDEPENDENT_AMBULATORY_CARE_PROVIDER_SITE_OTHER): Payer: Medicare Other | Admitting: Family Medicine

## 2015-06-28 ENCOUNTER — Encounter: Payer: Self-pay | Admitting: Family Medicine

## 2015-06-28 ENCOUNTER — Telehealth (HOSPITAL_COMMUNITY): Payer: Self-pay

## 2015-06-28 VITALS — BP 155/86 | HR 93 | Temp 98.4°F

## 2015-06-28 DIAGNOSIS — N390 Urinary tract infection, site not specified: Secondary | ICD-10-CM | POA: Diagnosis not present

## 2015-06-28 MED ORDER — CEFTRIAXONE SODIUM 1 G IJ SOLR
1.0000 g | Freq: Once | INTRAMUSCULAR | Status: AC
  Administered 2015-06-28: 1 g via INTRAMUSCULAR

## 2015-06-28 NOTE — Telephone Encounter (Signed)
Post ED Visit - Positive Culture Follow-up  Culture report reviewed by antimicrobial stewardship pharmacist:  []  Celedonio MiyamotoJeremy Frens, Pharm.D., BCPS []  Georgina PillionElizabeth Martin, [address] BCPS []  LakeviewMinh Pham, [address]Ph[address]D., BCPS, AAHIVP []  Estella HuskMichelle Turner, Pharm.D., BCPS, AAHIVP []  Cristy Reyes, [address] [x]  Kathe MarinerNick G Cassie Stewart, Pharm.D.  Positive urine culture Treated with cephalexin  organism sensitive to the same and no further patient follow-up is required at this time.  Ashley JacobsFesterman, Ardelle Haliburton C 06/28/2015, 9:34 AM

## 2015-06-28 NOTE — Assessment & Plan Note (Signed)
Recurrent UTI - family perceives that keflex has not helped, despite recent culture being sensitive to cefazolin. Of note patient has grown multiple other species of varying resistances in the last few months. Patient is nontoxic in appearance, well hydrated, with stable vitals today. Reportedly in the ED when catheterization was done, lots of sediment drained. Catheterization today produced 191 ml - so question whether patient is emptying bladder well. Will thus set up trial of home catheterizations. Plan: - repeat urine culture today (catheterized) to eval for possible alternative causative bacteria - referral to home health for in & out cath teaching & supplies - 1g ceftriaxone IM today, stay on keflex - follow up with myself in clinic in 2 days - patient discussed with & seen by PCP Dr. Randolm IdolFletke who is in agreement

## 2015-06-28 NOTE — Patient Instructions (Signed)
Giving shot of antibiotics today (ceftriaxone) Will have home health speak with you about coming out and teaching about catheterizations, to be done twice per day Recollected urine culture today  Follow up this Friday - see appointment time above  Be well, Dr. Pollie MeyerMcIntyre

## 2015-06-28 NOTE — Addendum Note (Signed)
Addended by: Pamelia HoitBLOUNT, Jarod Bozzo C on: 06/28/2015 10:34 AM   Modules accepted: Orders

## 2015-06-28 NOTE — Progress Notes (Signed)
Patient ID: Victoria Lewis, female   DOB: April 01, 1928, 81 y.o.   MRN: [id number] Date of Visit: 06/28/2015   HPI:  Pt presents for a same day appointment to discuss recurrent UTI. Accompanied by her family members, who provide the history as patient has dementia.  They report patient treated for UTI recently with keflex, stopped in last Tuesday. Then on Sunday she seemed to not be feeling well so they took her to the ED. Had urine cath done which showed +UTI, urine grew e coli resistant to cipro & bactrim but sensitive to keflex. Given shot of ceftriaxone in ED and discharged on PO keflex. They now return because she does not seem to be getting better. She is much less interactive than normal, and it is a struggle to get her to eat. She is drinking well. No fevers. Has not complained of pain, but has just said she feels "sick".  ROS: See HPI  PMFSH: history of CVA, CKD3, complete heart block with loop recorder, DVT April 2016, dementia, recurrent UTI, HLD  PHYSICAL EXAM: BP 155/86 mmHg  Pulse 93  Temp(Src) 98.4 F (36.9 C) Gen: NAD, cooperative and pleasant HEENT: moist mucous membranes. normocephalic, atraumatic  Heart: regular rate and rhythm no murmur Lungs: clear to auscultation bilaterally, NWOB Abdomen: soft, nontender to palpation no masses or organomegaly appreciated (examined in wheelchair) Neuro: alert but does not interact much. Will answer some question. No focal deficits noted. Smiles socially.  ASSESSMENT/PLAN:  Recurrent UTI Recurrent UTI - family perceives that keflex has not helped, despite recent culture being sensitive to cefazolin. Of note patient has grown multiple other species of varying resistances in the last few months. Patient is nontoxic in appearance, well hydrated, with stable vitals today. Reportedly in the ED when catheterization was done, lots of sediment drained. Catheterization today produced 191 ml - so question whether patient is emptying bladder well.  Will thus set up trial of home catheterizations. Plan: - repeat urine culture today (catheterized) to eval for possible alternative causative bacteria - referral to home health for in & out cath teaching & supplies - 1g ceftriaxone IM today, stay on keflex - follow up with myself in clinic in 2 days - patient discussed with & seen by PCP Dr. Randolm IdolFletke who is in agreement    FOLLOW UP: F/u in 2 days for recurrent UTI  GrenadaBrittany J. Pollie MeyerMcIntyre, MD [facility]

## 2015-06-29 ENCOUNTER — Ambulatory Visit: Payer: Medicare Other | Admitting: Family Medicine

## 2015-06-29 ENCOUNTER — Ambulatory Visit (INDEPENDENT_AMBULATORY_CARE_PROVIDER_SITE_OTHER): Payer: Medicare Other | Admitting: *Deleted

## 2015-06-29 DIAGNOSIS — R55 Syncope and collapse: Secondary | ICD-10-CM

## 2015-06-30 ENCOUNTER — Encounter: Payer: Self-pay | Admitting: Family Medicine

## 2015-06-30 ENCOUNTER — Ambulatory Visit (INDEPENDENT_AMBULATORY_CARE_PROVIDER_SITE_OTHER): Payer: Medicare Other | Admitting: Family Medicine

## 2015-06-30 ENCOUNTER — Telehealth: Payer: Self-pay | Admitting: Family Medicine

## 2015-06-30 VITALS — BP 118/70 | HR 64 | Temp 98.5°F

## 2015-06-30 DIAGNOSIS — N390 Urinary tract infection, site not specified: Secondary | ICD-10-CM

## 2015-06-30 LAB — URINE CULTURE

## 2015-06-30 NOTE — Assessment & Plan Note (Signed)
Clinically improving.  Urine culture from 2 days ago is not final, but results thus far have shown gram positive cocci. Suspect may be enterococcus. Last time she had enterococcus UTI, was resistant to vancomycin and required admission to hospital for IV linezolid Hopefully will be sensitive to oral antibiotic this time. Will await full culture results. I am out next week for vacation, will sign out follow up of results to Dr.Walden, who will be covering fo rme. HH catheterizations not yet set up - our referral coordinator is out today. No acute need for this but it may prevent some recurrences of UTI's. This will need to be followed up on Monday, will message Tia about this.

## 2015-06-30 NOTE — Progress Notes (Signed)
Date of Visit: 06/30/2015   HPI:  Victoria Lewis presents for follow up of UTI. Accompanied by family members.  Last seen 2 days ago on 10/19. At that visit, given IM ceftriaxone x1, and urine culture recollected as patient was not improving on keflex. Since Wednesday's visit she has done well. Mental status seems to be clearer. She is eating and drinking well. No fevers. Patient denies being in any pain.  We also attempted to get home health set up to perform intermittent catheterizations. Family has not heard about HH yet.  ROS: See HPI.  PMFSH: history of CVA, CKD3, complete heart block with loop recorder, DVT April 2016, dementia, recurrent UTI, HLD  PHYSICAL EXAM: BP 118/70 mmHg  Pulse 64  Temp(Src) 98.5 F (36.9 C) (Oral)  Wt  Gen: NAD, pleasant, cooeprative, sitting in wheelchair HEENT: normocephalic, atraumatic, MMM Heart: regular rate and rhythm no murmur Lungs: clear to auscultation bilaterally, normal work of breathing Abdomen: soft nontender to palpation, no suprapubic tenderness Neuro: alert, demented, pleasant and smiling, answers brief questions  ASSESSMENT/PLAN:  Recurrent UTI Clinically improving.  Urine culture from 2 days ago is not final, but results thus far have shown gram positive cocci. Suspect may be enterococcus. Last time she had enterococcus UTI, was resistant to vancomycin and required admission to hospital for IV linezolid Hopefully will be sensitive to oral antibiotic this time. Will await full culture results. I am out next week for vacation, will sign out follow up of results to Dr.Walden, who will be covering fo rme. HH catheterizations not yet set up - our referral coordinator is out today. No acute need for this but it may prevent some recurrences of UTI's. This will need to be followed up on Monday, will message Tia about this.   FOLLOW UP: F/u as needed if symptoms worsen or do not improve.   GrenadaBrittany J. Pollie MeyerMcIntyre, MD [facility]

## 2015-06-30 NOTE — Telephone Encounter (Signed)
Discussed urine culture with Dr. Pollie MeyerMcIntyre. Patient grew Enterococcus susceptible only to Vancomycin. Patient seen in office today and doing well. Suspect contaminant vs colonization.   Discussed findings with patient's daughter (nancy). She is agreeable to hold on admission. Return precautions discussed.

## 2015-06-30 NOTE — Patient Instructions (Signed)
Continue course of antibiotics We will call you if we need to change the medicine We will still work on home health for catheterizations  Be well, Dr. Pollie MeyerMcIntyre

## 2015-07-03 ENCOUNTER — Emergency Department (HOSPITAL_COMMUNITY): Payer: Medicare Other

## 2015-07-03 ENCOUNTER — Other Ambulatory Visit (HOSPITAL_COMMUNITY): Payer: Self-pay

## 2015-07-03 ENCOUNTER — Inpatient Hospital Stay (HOSPITAL_COMMUNITY)
Admission: EM | Admit: 2015-07-03 | Discharge: 2015-07-05 | DRG: 312 | Disposition: A | Discharge status: Home / Self Care | Payer: Medicare Other | Attending: Family Medicine | Admitting: Family Medicine

## 2015-07-03 ENCOUNTER — Encounter (HOSPITAL_COMMUNITY): Payer: Self-pay | Admitting: Emergency Medicine

## 2015-07-03 ENCOUNTER — Other Ambulatory Visit: Payer: Self-pay

## 2015-07-03 DIAGNOSIS — Z79899 Other long term (current) drug therapy: Secondary | ICD-10-CM | POA: Diagnosis not present

## 2015-07-03 DIAGNOSIS — N183 Chronic kidney disease, stage 3 (moderate): Secondary | ICD-10-CM | POA: Diagnosis present

## 2015-07-03 DIAGNOSIS — Z8673 Personal history of transient ischemic attack (TIA), and cerebral infarction without residual deficits: Secondary | ICD-10-CM

## 2015-07-03 DIAGNOSIS — E860 Dehydration: Secondary | ICD-10-CM | POA: Diagnosis not present

## 2015-07-03 DIAGNOSIS — I129 Hypertensive chronic kidney disease with stage 1 through stage 4 chronic kidney disease, or unspecified chronic kidney disease: Secondary | ICD-10-CM | POA: Diagnosis present

## 2015-07-03 DIAGNOSIS — Z86718 Personal history of other venous thrombosis and embolism: Secondary | ICD-10-CM

## 2015-07-03 DIAGNOSIS — Z9841 Cataract extraction status, right eye: Secondary | ICD-10-CM | POA: Diagnosis not present

## 2015-07-03 DIAGNOSIS — K219 Gastro-esophageal reflux disease without esophagitis: Secondary | ICD-10-CM | POA: Diagnosis present

## 2015-07-03 DIAGNOSIS — Z9842 Cataract extraction status, left eye: Secondary | ICD-10-CM | POA: Diagnosis not present

## 2015-07-03 DIAGNOSIS — I10 Essential (primary) hypertension: Secondary | ICD-10-CM | POA: Diagnosis not present

## 2015-07-03 DIAGNOSIS — B952 Enterococcus as the cause of diseases classified elsewhere: Secondary | ICD-10-CM | POA: Diagnosis present

## 2015-07-03 DIAGNOSIS — M419 Scoliosis, unspecified: Secondary | ICD-10-CM | POA: Diagnosis present

## 2015-07-03 DIAGNOSIS — I442 Atrioventricular block, complete: Secondary | ICD-10-CM | POA: Diagnosis present

## 2015-07-03 DIAGNOSIS — M1990 Unspecified osteoarthritis, unspecified site: Secondary | ICD-10-CM | POA: Diagnosis present

## 2015-07-03 DIAGNOSIS — N390 Urinary tract infection, site not specified: Secondary | ICD-10-CM | POA: Diagnosis present

## 2015-07-03 DIAGNOSIS — R55 Syncope and collapse: Secondary | ICD-10-CM | POA: Diagnosis not present

## 2015-07-03 DIAGNOSIS — F0390 Unspecified dementia without behavioral disturbance: Secondary | ICD-10-CM | POA: Diagnosis present

## 2015-07-03 DIAGNOSIS — E785 Hyperlipidemia, unspecified: Secondary | ICD-10-CM | POA: Diagnosis present

## 2015-07-03 DIAGNOSIS — R32 Unspecified urinary incontinence: Secondary | ICD-10-CM | POA: Diagnosis present

## 2015-07-03 DIAGNOSIS — I4891 Unspecified atrial fibrillation: Secondary | ICD-10-CM | POA: Diagnosis present

## 2015-07-03 DIAGNOSIS — Z96643 Presence of artificial hip joint, bilateral: Secondary | ICD-10-CM | POA: Diagnosis present

## 2015-07-03 DIAGNOSIS — R159 Full incontinence of feces: Secondary | ICD-10-CM | POA: Diagnosis present

## 2015-07-03 DIAGNOSIS — Z87440 Personal history of urinary (tract) infections: Secondary | ICD-10-CM

## 2015-07-03 LAB — COMPREHENSIVE METABOLIC PANEL
ALBUMIN: 4.2 g/dL (ref 3.5–5.0)
ALK PHOS: 87 U/L (ref 38–126)
ALT: 13 U/L — AB (ref 14–54)
ANION GAP: 9 (ref 5–15)
AST: 24 U/L (ref 15–41)
BUN: 15 mg/dL (ref 6–20)
CHLORIDE: 101 mmol/L (ref 101–111)
CO2: 25 mmol/L (ref 22–32)
Calcium: 9.5 mg/dL (ref 8.9–10.3)
Creatinine, Ser: 0.94 mg/dL (ref 0.44–1.00)
GFR calc non Af Amer: 54 mL/min — ABNORMAL LOW (ref >60)
GLUCOSE: 109 mg/dL — AB (ref 65–99)
Potassium: 3.9 mmol/L (ref 3.5–5.1)
SODIUM: 135 mmol/L (ref 135–145)
Total Bilirubin: 0.5 mg/dL (ref 0.3–1.2)
Total Protein: 7.3 g/dL (ref 6.5–8.1)

## 2015-07-03 LAB — CBC WITH DIFFERENTIAL/PLATELET
BASOS PCT: 1 %
Basophils Absolute: 0.1 10*3/uL (ref 0.0–0.1)
EOS ABS: 0.2 10*3/uL (ref 0.0–0.7)
EOS PCT: 2 %
HCT: 34.5 % — ABNORMAL LOW (ref 36.0–46.0)
HEMOGLOBIN: 11.2 g/dL — AB (ref 12.0–15.0)
LYMPHS ABS: 0.7 10*3/uL (ref 0.7–4.0)
Lymphocytes Relative: 6 %
MCH: 27.8 pg (ref 26.0–34.0)
MCHC: 32.5 g/dL (ref 30.0–36.0)
MCV: 85.6 fL (ref 78.0–100.0)
Monocytes Absolute: 0.7 10*3/uL (ref 0.1–1.0)
Monocytes Relative: 6 %
NEUTROS PCT: 87 %
Neutro Abs: 10.3 10*3/uL — ABNORMAL HIGH (ref 1.7–7.7)
PLATELETS: 393 10*3/uL (ref 150–400)
RBC: 4.03 MIL/uL (ref 3.87–5.11)
RDW: 15 % (ref 11.5–15.5)
WBC: 11.9 10*3/uL — AB (ref 4.0–10.5)

## 2015-07-03 LAB — URINALYSIS, ROUTINE W REFLEX MICROSCOPIC
Bilirubin Urine: NEGATIVE
Glucose, UA: NEGATIVE mg/dL
Hgb urine dipstick: NEGATIVE
Ketones, ur: NEGATIVE mg/dL
NITRITE: NEGATIVE
PROTEIN: NEGATIVE mg/dL
Specific Gravity, Urine: 1.007 (ref 1.005–1.030)
UROBILINOGEN UA: 0.2 mg/dL (ref 0.0–1.0)
pH: 6.5 (ref 5.0–8.0)

## 2015-07-03 LAB — I-STAT CG4 LACTIC ACID, ED
LACTIC ACID, VENOUS: 1.87 mmol/L (ref 0.5–2.0)
LACTIC ACID, VENOUS: 2 mmol/L (ref 0.5–2.0)

## 2015-07-03 LAB — CBG MONITORING, ED: Glucose-Capillary: 111 mg/dL — ABNORMAL HIGH (ref 65–99)

## 2015-07-03 LAB — I-STAT TROPONIN, ED: Troponin i, poc: 0 ng/mL (ref 0.00–0.08)

## 2015-07-03 LAB — URINE MICROSCOPIC-ADD ON

## 2015-07-03 MED ORDER — SODIUM CHLORIDE 0.9 % IV BOLUS (SEPSIS): 500.0000 mL | Freq: Once | INTRAVENOUS | Status: DC

## 2015-07-03 MED ORDER — VANCOMYCIN HCL IN DEXTROSE 1-5 GM/200ML-% IV SOLN
1000.0000 mg | Freq: Once | INTRAVENOUS | Status: AC
  Administered 2015-07-03: 1000 mg via INTRAVENOUS
  Filled 2015-07-03: qty 200

## 2015-07-03 NOTE — ED Notes (Signed)
[facility] called for transport

## 2015-07-03 NOTE — Progress Notes (Signed)
New Admission Note:   Arrival: Via [facility] from [facility] ED Mental Orientation: Unable to assess at this time bc daughter removed hearing aids Telemetry: Placed on box [id number] Assessment:  See doc flowsheet Skin: Right upper arm is red and has skin tear.  Abrasions also noted on left lower leg.  Bottom is red but blanchable. IV: Left AC IV and right hand IV Pain: None Safety Measures:  Call bell placed within reach; patient instructed on use of call bell and verbalized understanding. Bed in lowest position.  Non-skid socks on.  Bed alarm on. [address] Orientation: Patient oriented to staff, room, and unit. Family: Daughter at bedside  Orders have been reviewed and implemented. Will continue to monitor.  Rozann Leschesebecca Brynlea Spindler, RN, BSN

## 2015-07-03 NOTE — ED Provider Notes (Signed)
CSN: [id number]     Arrival date & time 07/03/15  1607 History   First MD Initiated Contact with Patient 07/03/15 1621     Chief Complaint  Patient presents with  . Urinary Tract Infection  . Near Syncope     (Consider location/radiation/quality/duration/timing/severity/associated sxs/prior Treatment) Patient is a 81 y.o. female presenting with urinary tract infection and near-syncope. The history is provided by medical records and a relative (The patient had a syncopal episode in the bathroom today, according to her daughter. She has been seen over at family practice clinic for urinary tract infection. She was diagnosed with enterococcus on Friday urine culture shows SENSITIVITY to vancomycin).  Urinary Tract Infection Pain quality:  Unable to specify Pain severity:  No pain Onset quality:  Sudden Timing:  Constant Progression:  Waxing and waning Chronicity:  Recurrent Recent urinary tract infections: yes   Near Syncope    Past Medical History  Diagnosis Date  . Hypertension   . GERD (gastroesophageal reflux disease)   . H/O hiatal hernia   . Arthritis   . Syncope 07/13/2014  . Bladder infection, chronic     [name]/notes 08/30/2014  . AV block, complete (HCC)   . Chest pain   . Decreased dorsalis pedis pulse   . Hammer toe   . Hyperlipidemia   . Onychogryposis of toenail   . Osteoporosis   . Scoliosis   . Status post placement of implantable loop recorder   . Syncope   . Uterine disorder   . Diaphragmatic hernia   . Hx of epistaxis   . Chest pain   . Iron deficiency anemia   . Chronic back pain   . Dementia   . Chronic kidney disease (CKD), stage III (moderate)   . Recurrent UTI (urinary tract infection)   . UTI (urinary tract infection) 04/2015   Past Surgical History  Procedure Laterality Date  . Joint replacement    . Cataract extraction, bilateral Bilateral   . Total hip arthroplasty Right 10/11/2013    Procedure: TOTAL HIP ARTHROPLASTY;  Surgeon: Nestor LewandowskyFrank J  Rowan, MD;  Location: [facility] OR;  Service: Orthopedics;  Laterality: Right;  . Loop recorder implant    . Tubal ligation    . Total hip arthroplasty Left 05/2006    [name]/notes 01/22/2011   Family History  Problem Relation Age of Onset  . Stroke Mother [id number]    died of stroke  . Hypertension Mother   . Heart disease Mother    Social History  Substance Use Topics  . Smoking status: Never Smoker   . Smokeless tobacco: Never Used  . Alcohol Use: No   OB History    No data available     Review of Systems  Unable to perform ROS: Dementia  Cardiovascular: Positive for near-syncope.      Allergies  Review of patient's allergies indicates no known allergies.  Home Medications   Prior to Admission medications   Medication Sig Start Date End Date Taking? Authorizing Provider  acetaminophen (TYLENOL) 500 MG tablet Take 1 tablet (500 mg total) by mouth every 6 (six) hours as needed. Patient taking differently: Take 500 mg by mouth 4 (four) times daily.  09/01/14  Yes Glori LuisEric G Sonnenberg, MD  amLODipine (NORVASC) 2.5 MG tablet Take 1 tablet (2.5 mg total) by mouth daily. 06/16/15  Yes Uvaldo RisingKyle J Fletke, MD  cholecalciferol (VITAMIN D) 1000 UNITS tablet Take 1,000 Units by mouth daily.   Yes Historical Provider, MD  CRANBERRY PO Take  240 mLs by mouth 3 (three) times daily. Cranberry concentrate solution   Yes Historical Provider, MD  D-MANNOSE PO Take 1 tablet by mouth 2 (two) times daily.   Yes Historical Provider, MD  fluticasone (FLONASE) 50 MCG/ACT nasal spray Place 2 sprays into both nostrils daily. Patient taking differently: Place 2 sprays into both nostrils daily as needed for allergies.  12/05/14  Yes Uvaldo Rising, MD  gabapentin (NEURONTIN) 100 MG capsule Take 100 mg by mouth 4 (four) times daily as needed (pain). Take 1 capsule TID and may take 1 more if needed for breakthrough pain 07/28/14  Yes Historical Provider, MD  nystatin (MYCOSTATIN/NYSTOP) 100000 UNIT/GM POWD Apply to affected area  twice daily. Patient taking differently: Apply 1 g topically 2 (two) times daily as needed (affected areas).  02/28/15  Yes Uvaldo Rising, MD  omeprazole (PRILOSEC) 20 MG capsule TAKE 1 CAPSULE (20 MG TOTAL) BY MOUTH DAILY. 05/08/15  Yes Uvaldo Rising, MD  Probiotic Product (ALIGN) 4 MG CAPS Take 4 mg by mouth daily.   Yes Historical Provider, MD  Wheat Dextrin (BENEFIBER) POWD Take 5-10 mg by mouth daily. Take  daily, and if needed for constipation take another .   Yes Historical Provider, MD  cephALEXin (KEFLEX) 500 MG capsule Take 1 capsule (500 mg total) by mouth 4 (four) times daily. Patient not taking: Reported on 07/03/2015 06/25/15   Bethann Berkshire, MD   BP 185/74 mmHg  Pulse 90  Temp(Src) 97.5 F (36.4 C) (Oral)  Resp 18  SpO2 99% Physical Exam  Constitutional: She appears well-developed.  HENT:  Head: Normocephalic.  Eyes: Conjunctivae and EOM are normal. No scleral icterus.  Neck: Neck supple. No thyromegaly present.  Cardiovascular: Normal rate and regular rhythm.  Exam reveals no gallop and no friction rub.   No murmur heard. Pulmonary/Chest: No stridor. She has no wheezes. She has no rales. She exhibits no tenderness.  Abdominal: She exhibits no distension. There is no tenderness. There is no rebound.  Musculoskeletal: Normal range of motion. She exhibits no edema.  Lymphadenopathy:    She has no cervical adenopathy.  Neurological: She is alert. She exhibits normal muscle tone. Coordination normal.  Patient only oriented to person, does not follow commands. This is her normal  Skin: No rash noted. No erythema.  Psychiatric: She has a normal mood and affect. Her behavior is normal.    ED Course  Procedures (including critical care time) Labs Review Labs Reviewed  CBC WITH DIFFERENTIAL/PLATELET - Abnormal; Notable for the following:    WBC 11.9 (*)    Hemoglobin 11.2 (*)    HCT 34.5 (*)    Neutro Abs 10.3 (*)    All other components within normal limits   COMPREHENSIVE METABOLIC PANEL - Abnormal; Notable for the following:    Glucose, Bld 109 (*)    ALT 13 (*)    GFR calc non Af Amer 54 (*)    All other components within normal limits  URINALYSIS, ROUTINE W REFLEX MICROSCOPIC (NOT AT [facility]) - Abnormal; Notable for the following:    Leukocytes, UA SMALL (*)    All other components within normal limits  CBG MONITORING, ED - Abnormal; Notable for the following:    Glucose-Capillary 111 (*)    All other components within normal limits  CULTURE, BLOOD (ROUTINE X 2)  CULTURE, BLOOD (ROUTINE X 2)  URINE MICROSCOPIC-ADD ON  I-STAT TROPOININ, ED  I-STAT CG4 LACTIC ACID, ED  I-STAT CG4 LACTIC ACID, ED  Imaging Review Dg Chest Port 1 View  07/03/2015  CLINICAL DATA:  Syncope today, urinary tract infection EXAM: PORTABLE CHEST 1 VIEW COMPARISON:  06/25/2015 FINDINGS: Significant elevation of the right diaphragm stable. Heart size normal. Lungs clear. IMPRESSION: No active disease. Electronically Signed   By: Esperanza Heir M.D.   On: 07/03/2015 17:30   I have personally reviewed and evaluated these images and lab results as part of my medical decision-making.   EKG Interpretation None      MDM   Final diagnoses:  None    Patient has a urine culture that shows enterococcus sensitive to vancomycin only. Labs otherwise are unremarkable. She'll be admitted to [facility]    Bethann Berkshire, MD 07/03/15 [phone]

## 2015-07-03 NOTE — ED Notes (Addendum)
Per EMS-from home, had syncopal episode on the toilet. Recently treated for UTI, tested positive for another UTI per urine culture recently (Approximately around 10/19). MD recommends patient be admitted d/t this. Pt is hard of hearing. On arrival patient was not alert to verbal stimuli. 12 Lead NSR. Hx chronic UTIs. Pt is alert at this time. BP 140/90 HR 97 CBG 111 mg/dl SpO2 16%98% on RA. At baseline pt is A&Ox4. No c/o pain.

## 2015-07-03 NOTE — ED Notes (Addendum)
Pt was soiled upon arrival. Per EMS pt was using restroom when they picked her up. Pt came with only t-shirt on. Cleaned pt before obtaining VS and EKG. Placed pt in depends. RN aware of redness around pelvic area.

## 2015-07-03 NOTE — Progress Notes (Signed)
Loop recorder 

## 2015-07-03 NOTE — ED Notes (Signed)
Nurse drawing labs. 

## 2015-07-03 NOTE — H&P (Signed)
Family Medicine Teaching [facility] Admission History and Physical Service Pager: [phone]  [name] Medical record number: [id number] Date of birth: [birth year] Age: 81 y.o. Gender: female  Primary Care Provider: Uvaldo Rising, MD Consultants: cardiology Code Status: full  Chief Complaint: UTI  Assessment and Plan: ANIVEA VELASQUES is a 81 y.o. female presenting with enterococcus UTI and pre-syncope. PMH is significant for recurrent UTI, Complete AV Block, HTN, CKD, h/o CVA, h/o DVT, HLD, h/o recurrent syncope and scoliosis,   Recurrent UTI: clinically treated for UTI with CTX 1gm and kelflex at clinic on 10/19. Urine culture collected at that time grew Enterococcus sensitive to vanc only. Per her note from clinic she has history of VRE in the past treated with IV Linezolid. Also history of E. Coli, proteus and Klebsiella UTI within the last three months. Today, her UA is remarkable only for small Leukocytes. Urine culutre pending. She is afebrile. CBC remarkable for WBC of 12. BMP within normal limit. Status post 1 gm of vanc at [facility]. -Will admit to FPTS -IV vanc per pharmacy. -Monitor vital signs -Daily CBC - f/u blood cultures  Presyncope: could be vasovagal given temporal association with restroom use. Can't rule out arrhythmia. However, she has loop recorder implanted which could be interrogated in the morning. EKG read as Afib. However, the rhythm looks regular apart from the artifacts that has obliterated p waves. Troponin negative. No chest pain or dyspnea. Orthostatic hypotension is also a possibility given ongoing UTI. However, she was hemodynamically stable on arrival to ED. Patient with previous history of heart block. Episode not preceded by chest pain, palpitation, diaphoresis to suggest cardiac etiology and patient was out for some time (unknown amount of time, but longer than usual). Patient without evidence of seizure-like activity.  -will touch  base with her cardiologist for loop interrogation -order TSH (2.9 in 2015) - telemetry  CKD-3: Cr 0.94. B/l 0.8-1.0. Stable -will get Bmet in the morning given she on vanc now  Hypertension: on Amlodipine 2.5 mg at home. SBP 158-188.  -Will resume home amlodipine -Monitor BP closely  Scoliosis: on scheduled tylenol and gabapentin at home. -will resume home meds.  Hyperlipidemia: not on any treatment at home. Lipid panel in 12/2014: CH 291, TG 178, HDL 44 LDL 211 -Unclear the benefit to risk ratio in her age.  History of heart block with BB: in 07/2014 -EKG negative now. - telemetry  History of CVA/PFO/DVT: in 12/2014. Treated with Eliquis in the past. Doesn't appear to be on any meds now.  FEN/GI:  regular diet KVO  PPX: lovonex  Disposition: floor for treatment of enterococcus UTI. Family declines PT services  History of Present Illness:  Victoria Lewis is a 81 y.o. female presenting with an episode of presyncope. She presented to [facility] hospital via EMS and transferred here for treatment of UTI. History provided by a daughter, who is also a HCPOA.  Patient with underlying dementia. She is awake and responds to greeting. She oriented to self and place but not time.  About 3 pm this afternoon, patient has an episode of "syncope" while on the toilet described as head drooping. There is no fall. However, patient was unresponsive to her husband. EMS was called and transported the patient to [facility] hospital.   Per daughter this is a chronic condition that has been going on for years. However, she thinks this time, it has lasted longer than usual although she wasn't there at the time. Prior  to "syncopal" episode, patient was interactive and talking to her daughter over a phone. Patient has a loop recorder and is followed by Dr. Graciela Husbands, Cardiology. On 10/19 patient presented to [facility] clinic and was clinically treated with a dose of CTX and Keflex for UTI. However, urine culture from that time  grew enterococcus.   Patient without fevers, dysuria or urgency, although daughter states that she never reports symptoms. She has urinary incontinence which is not new. She also has occasional fecal incontinence that hasn't changed from baseline. Patient is currently at baseline per daughter.  Patient with similar presentation a few months ago. Patient with underlying dementia.   [facility] ED course: CBC w/diff significant for WBC of 12, Bmet, trop negative, lactic acid 1.87, UA with small WBC's, CXR negative, blood cultures collected but about 30 minutes after vanc  Review Of Systems: Per HPI with the following additions:  Otherwise 12 point review of systems was performed and was unremarkable.  Patient Active Problem List   Diagnosis Date Noted  . UTI (urinary tract infection) 07/04/2015  . Right ankle instability 05/19/2015  . Asystole (HCC)   . Near syncope   . Abnormal urine odor 04/24/2015  . PFO (patent foramen ovale) 03/23/2015  . Cerebral infarction due to embolism of right middle cerebral artery (HCC) 03/23/2015  . Paradoxical embolism (HCC) 03/23/2015  . Enterococcus UTI 03/07/2015  . UTI (lower urinary tract infection) 03/07/2015  . Intertrigo 01/24/2015  . Skin lesion of face 01/24/2015  . DVT (deep venous thrombosis) (HCC) 12/27/2014  . Cough 12/27/2014  . Stroke with cerebral ischemia (HCC)   . HLD (hyperlipidemia)   . Essential hypertension   . Focal motor deficit 12/19/2014  . Low back pain radiating to left leg 11/01/2014  . Complete heart block (HCC) 07/14/2014  . Dementia   . Chronic kidney disease (CKD), stage III (moderate) 04/11/2014  . History of urinary anomaly 03/04/2014  . Recurrent UTI 02/28/2014   Past Medical History: Past Medical History  Diagnosis Date  . Hypertension   . GERD (gastroesophageal reflux disease)   . H/O hiatal hernia   . Arthritis   . Syncope 07/13/2014  . Bladder infection, chronic     [name] 08/30/2014  . AV block, complete  (HCC)   . Chest pain   . Decreased dorsalis pedis pulse   . Hammer toe   . Hyperlipidemia   . Onychogryposis of toenail   . Osteoporosis   . Scoliosis   . Status post placement of implantable loop recorder   . Syncope   . Uterine disorder   . Diaphragmatic hernia   . Hx of epistaxis   . Chest pain   . Iron deficiency anemia   . Chronic back pain   . Dementia   . Chronic kidney disease (CKD), stage III (moderate)   . Recurrent UTI (urinary tract infection)   . UTI (urinary tract infection) 04/2015   Past Surgical History: Past Surgical History  Procedure Laterality Date  . Joint replacement    . Cataract extraction, bilateral Bilateral   . Total hip arthroplasty Right 10/11/2013    Procedure: TOTAL HIP ARTHROPLASTY;  Surgeon: Nestor Lewandowsky, MD;  Location: [facility] OR;  Service: Orthopedics;  Laterality: Right;  . Loop recorder implant    . Tubal ligation    . Total hip arthroplasty Left 05/2006    [name] 01/22/2011   Social History: Social History  Substance Use Topics  . Smoking status: Never Smoker   . Smokeless  tobacco: Never Used  . Alcohol Use: No   Additional social history: lives with husband. Has home health that comes in daily for three hours a day. Husband helps with transfer from bed to wheelchair  Please also refer to relevant sections of EMR.  Family History: Family History  Problem Relation Age of Onset  . Stroke Mother 7067    died of stroke  . Hypertension Mother   . Heart disease Mother    Allergies and Medications: No Known Allergies No current facility-administered medications on file prior to encounter.   Current Outpatient Prescriptions on File Prior to Encounter  Medication Sig Dispense Refill  . acetaminophen (TYLENOL) 500 MG tablet Take 1 tablet (500 mg total) by mouth every 6 (six) hours as needed. (Patient taking differently: Take 500 mg by mouth 4 (four) times daily. ) 30 tablet 0  . amLODipine (NORVASC) 2.5 MG tablet Take 1 tablet (2.5 mg total)  by mouth daily. 90 tablet 1  . cholecalciferol (VITAMIN D) 1000 UNITS tablet Take 1,000 Units by mouth daily.    Marland Kitchen. CRANBERRY PO Take 240 mLs by mouth 3 (three) times daily. Cranberry concentrate solution    . D-MANNOSE PO Take 1 tablet by mouth 2 (two) times daily.    . fluticasone (FLONASE) 50 MCG/ACT nasal spray Place 2 sprays into both nostrils daily. (Patient taking differently: Place 2 sprays into both nostrils daily as needed for allergies. ) 16 g 6  . gabapentin (NEURONTIN) 100 MG capsule Take 100 mg by mouth 4 (four) times daily as needed (pain). Take 1 capsule TID and may take 1 more if needed for breakthrough pain  2  . nystatin (MYCOSTATIN/NYSTOP) 100000 UNIT/GM POWD Apply to affected area twice daily. (Patient taking differently: Apply 1 g topically 2 (two) times daily as needed (affected areas). ) 60 g 1  . omeprazole (PRILOSEC) 20 MG capsule TAKE 1 CAPSULE (20 MG TOTAL) BY MOUTH DAILY. 90 capsule 1  . Probiotic Product (ALIGN) 4 MG CAPS Take 4 mg by mouth daily.    . Wheat Dextrin (BENEFIBER) POWD Take 5-10 mg by mouth daily. Take 5mg  daily, and if needed for constipation take another 5mg .    . cephALEXin (KEFLEX) 500 MG capsule Take 1 capsule (500 mg total) by mouth 4 (four) times daily. (Patient not taking: Reported on 07/03/2015) 28 capsule 0    Objective: BP 171/74 mmHg  Pulse 90  Temp(Src) 98.3 F (36.8 C) (Oral)  Resp 18  Ht 5\' 2"  (1.575 m)  Wt 126 lb 6.4 oz (57.335 kg)  BMI 23.11 kg/m2  SpO2 98% Exam: Gen: frail-appearing, lying in bed with no acute distress Eyes: PERRLA, sclera anicteric Nares: clear, no erythema, swelling or congestion Oropharynx: clear, moist CV: RRR. S1 & S2 audible, no murmurs. 2+ radial and DP pulses bilaterally. Resp: no apparent WOB, CTAB. Abd: +BS. Soft, NDNT, no rebound or guarding.  Ext: No edema or gross deformities. Neuro: Alert and oriented to self, place and person, No gross focal deficits  Labs and Imaging: CBC BMET   Recent  Labs Lab 07/03/15 1807  WBC 11.9*  HGB 11.2*  HCT 34.5*  PLT 393    Recent Labs Lab 07/03/15 1807  NA 135  K 3.9  CL 101  CO2 25  BUN 15  CREATININE 0.94  GLUCOSE 109*  CALCIUM 9.5    Dg Chest Port 1 View  07/03/2015  CLINICAL DATA:  Syncope today, urinary tract infection EXAM: PORTABLE CHEST 1 VIEW COMPARISON:  06/25/2015 FINDINGS: Significant elevation of the right diaphragm stable. Heart size normal. Lungs clear. IMPRESSION: No active disease. Electronically Signed   By: Esperanza Heir M.D.   On: 07/03/2015 17:30    Almon Hercules, MD 07/04/2015, 2:00 AM PGY-1, [facility] FPTS Intern pager: [phone], text pages welcome  I have seen and examined the patient. I have read and agree with the above note. My changes are noted in blue.  Jacquelin Hawking, MD PGY-3, [facility] 07/04/2015, 7:22 AM

## 2015-07-04 DIAGNOSIS — I10 Essential (primary) hypertension: Secondary | ICD-10-CM

## 2015-07-04 DIAGNOSIS — R55 Syncope and collapse: Principal | ICD-10-CM

## 2015-07-04 DIAGNOSIS — E860 Dehydration: Secondary | ICD-10-CM

## 2015-07-04 DIAGNOSIS — N390 Urinary tract infection, site not specified: Secondary | ICD-10-CM

## 2015-07-04 LAB — BASIC METABOLIC PANEL
ANION GAP: 11 (ref 5–15)
BUN: 10 mg/dL (ref 6–20)
CHLORIDE: 107 mmol/L (ref 101–111)
CO2: 21 mmol/L — AB (ref 22–32)
Calcium: 9.4 mg/dL (ref 8.9–10.3)
Creatinine, Ser: 0.81 mg/dL (ref 0.44–1.00)
GFR calc Af Amer: >60 mL/min (ref >60)
GFR calc non Af Amer: >60 mL/min (ref >60)
Glucose, Bld: 85 mg/dL (ref 65–99)
POTASSIUM: 5 mmol/L (ref 3.5–5.1)
Sodium: 139 mmol/L (ref 135–145)

## 2015-07-04 LAB — CBC
HEMATOCRIT: 31.3 % — AB (ref 36.0–46.0)
HEMOGLOBIN: 10 g/dL — AB (ref 12.0–15.0)
MCH: 27 pg (ref 26.0–34.0)
MCHC: 31.9 g/dL (ref 30.0–36.0)
MCV: 84.6 fL (ref 78.0–100.0)
Platelets: 364 10*3/uL (ref 150–400)
RBC: 3.7 MIL/uL — AB (ref 3.87–5.11)
RDW: 15 % (ref 11.5–15.5)
WBC: 6 10*3/uL (ref 4.0–10.5)

## 2015-07-04 MED ORDER — SODIUM CHLORIDE 0.9 % IJ SOLN
3.0000 mL | Freq: Two times a day (BID) | INTRAMUSCULAR | Status: DC
  Administered 2015-07-04 – 2015-07-05 (×2): 3 mL via INTRAVENOUS

## 2015-07-04 MED ORDER — SODIUM CHLORIDE 0.9 % IV SOLN: 250.0000 mL | INTRAVENOUS | Status: DC | PRN

## 2015-07-04 MED ORDER — AMLODIPINE BESYLATE 5 MG PO TABS
2.5000 mg | ORAL_TABLET | Freq: Every day | ORAL | Status: DC
  Administered 2015-07-04 – 2015-07-05 (×2): 2.5 mg via ORAL
  Filled 2015-07-04 (×2): qty 1

## 2015-07-04 MED ORDER — VANCOMYCIN HCL 500 MG IV SOLR
500.0000 mg | INTRAVENOUS | Status: DC
  Filled 2015-07-04: qty 500

## 2015-07-04 MED ORDER — ENOXAPARIN SODIUM 40 MG/0.4ML ~~LOC~~ SOLN
40.0000 mg | SUBCUTANEOUS | Status: DC
  Administered 2015-07-04: 40 mg via SUBCUTANEOUS
  Filled 2015-07-04: qty 0.4

## 2015-07-04 MED ORDER — FLUTICASONE PROPIONATE 50 MCG/ACT NA SUSP: 2.0000 | Freq: Every day | NASAL | Status: DC | PRN

## 2015-07-04 MED ORDER — ACETAMINOPHEN 500 MG PO TABS
500.0000 mg | ORAL_TABLET | Freq: Four times a day (QID) | ORAL | Status: DC
  Administered 2015-07-04 – 2015-07-05 (×4): 500 mg via ORAL
  Filled 2015-07-04 (×5): qty 1

## 2015-07-04 MED ORDER — VITAMIN D 1000 UNITS PO TABS
1000.0000 [IU] | ORAL_TABLET | Freq: Every day | ORAL | Status: DC
  Administered 2015-07-04 – 2015-07-05 (×2): 1000 [IU] via ORAL
  Filled 2015-07-04 (×2): qty 1

## 2015-07-04 MED ORDER — GABAPENTIN 100 MG PO CAPS
100.0000 mg | ORAL_CAPSULE | Freq: Three times a day (TID) | ORAL | Status: DC
  Administered 2015-07-04 – 2015-07-05 (×4): 100 mg via ORAL
  Filled 2015-07-04 (×4): qty 1

## 2015-07-04 MED ORDER — SODIUM CHLORIDE 0.9 % IJ SOLN: 3.0000 mL | INTRAMUSCULAR | Status: DC | PRN

## 2015-07-04 MED ORDER — FOSFOMYCIN TROMETHAMINE 3 G PO PACK
3.0000 g | PACK | Freq: Once | ORAL | Status: AC
  Administered 2015-07-04: 3 g via ORAL
  Filled 2015-07-04 (×2): qty 3

## 2015-07-04 NOTE — Consult Note (Signed)
CARDIOLOGY CONSULT NOTE   Patient ID: Victoria Lewis MRN: [id number], DOB/AGE: 1929/79/17   Admit date: 07/03/2015 Date of Consult: 07/04/2015   Primary Physician: Uvaldo Rising, MD Primary Cardiologist: Dr. Graciela Husbands (previously Dr. Gypsy Balsam at [facility])  [name] with PMH of HTN, CKD stage III, CVA, DVT, HLD, recurrent syncope and complete AV block on BB presented with recurrent syncope  Problem List  Past Medical History  Diagnosis Date  . Hypertension   . GERD (gastroesophageal reflux disease)   . H/O hiatal hernia   . Arthritis   . Syncope 07/13/2014  . Bladder infection, chronic     [name] 08/30/2014  . AV block, complete (HCC)   . Chest pain   . Decreased dorsalis pedis pulse   . Hammer toe   . Hyperlipidemia   . Onychogryposis of toenail   . Osteoporosis   . Scoliosis   . Status post placement of implantable loop recorder   . Syncope   . Uterine disorder   . Diaphragmatic hernia   . Hx of epistaxis   . Chest pain   . Iron deficiency anemia   . Chronic back pain   . Dementia   . Chronic kidney disease (CKD), stage III (moderate)   . Recurrent UTI (urinary tract infection)   . UTI (urinary tract infection) 04/2015    Past Surgical History  Procedure Laterality Date  . Joint replacement    . Cataract extraction, bilateral Bilateral   . Total hip arthroplasty Right 10/11/2013    Procedure: TOTAL HIP ARTHROPLASTY;  Surgeon: Nestor Lewandowsky, MD;  Location: [facility] OR;  Service: Orthopedics;  Laterality: Right;  . Loop recorder implant    . Tubal ligation    . Total hip arthroplasty Left 05/2006    [name] 01/22/2011     Allergies  No Known Allergies  HPI   81 yo moderately demented female with PMH of HTN, CKD stage III, CVA, DVT, HLD, recurrent syncope and complete AV block on BB. Patient is well-known to me. She lives with her husband who takes care of her. Her daughter also lives very close to her. She was last  seen by me as cardiology consult in November 2015 when she came in with syncope while sitting on the toilet. Her loop recorder was interrogated at that time which showed 15 second complete heart block with heart rate in 30s. There were no significant pauses at the time. Her metoprolol was previously increased from 25 mg twice a day to 50 mg twice a day. Given the presence of complete heart block, she was taken off metoprolol completely. Her blood pressure has since been controlled on 2.5 mg amlodipine. Since she is off beta blocker, her daughter states the patient has been doing relatively well. She continues to have recurrent UTI which does not tend to go away despite multiple treatment. She was established with [facility] since last November. She was last seen by Dr. Graciela Husbands on 06/14/2015, at which time she denies any recurrent stroke or syncope. She was taken off statin medication as family did not find the medication beneficial despite absolute risk reduction of 2% over 5 years. It was felt her recurrent syncope is likely neurally mediated and less likely related to hypotension.  According to the daughter, the patient's husband contacted the daughter yesterday as the patient was having a large bowel movement. The daughter rushed over to help clean up the mess, on arrival, patient's  husband states she has just passed out. She did not fall, but was not responding. It is unknown how long she was out. Per report, she was only passed out for a few minutes. EMS was called and [facility] was [facility] and was later [facility] further evaluation. It was noted her recent urinalysis obtained on 10/16 was positive for nitrite and many bacteria. Blood culture was positive for Escherichia coli. Patient was admitted to family medicine service. Her loop recorder was interrogated which showed a 3 second pause occurred on 10/19, however no significant pause since. Cardiology has  been consulted for loop recorder interpretation and syncope.  Of note, patient is a poor historian given significant dementia. Majority of the history has been obtained from medical records and patient's daughter. Despite syncope, patient denies any discomfort at this time. She denies any fever, chill, dysuria, dizziness, chest pain or shortness of breath.  Inpatient Medications  . acetaminophen  500 mg Oral Q6H WA  . amLODipine  2.5 mg Oral Daily  . cholecalciferol  1,000 Units Oral Daily  . enoxaparin (LOVENOX) injection  40 mg Subcutaneous Q24H  . gabapentin  100 mg Oral TID  . sodium chloride  3 mL Intravenous Q12H  . sodium chloride  3 mL Intravenous Q12H    Family History Family History  Problem Relation Age of Onset  . Stroke Mother 1867    died of stroke  . Hypertension Mother   . Heart disease Mother      Social History Social History   Social History  . Marital Status: Married    Spouse Name: Acupuncturistcott  . Number of Children: 4  . Years of Education: College   Occupational History  . Retired    Social History Main Topics  . Smoking status: Never Smoker   . Smokeless tobacco: Never Used  . Alcohol Use: No  . Drug Use: No  . Sexual Activity: Not on file   Other Topics Concern  . Not on file   Social History Narrative   Lives with her husband Wagon WheelScott.    Caffeine use: rare     Review of Systems  General:  No chills, fever, night sweats or weight changes.  Cardiovascular:  No chest pain, dyspnea on exertion, edema, orthopnea, palpitations, paroxysmal nocturnal dyspnea. Dermatological: No rash, lesions/masses Respiratory: No cough, dyspnea Urologic: No hematuria, dysuria Abdominal:   No nausea, vomiting, diarrhea, bright red blood per rectum, melena, or hematemesis Neurologic:  No visual changes, wkns. +syncope. All other systems reviewed and are otherwise negative except as noted above.  Physical Exam  Blood pressure 135/63, pulse 80, temperature 98.9 F  (37.2 C), temperature source Oral, resp. rate 18, height 5\' 2"  (1.575 m), weight 126 lb 6.4 oz (57.335 kg), SpO2 99 %.  General: Pleasant, NAD Psych: Normal affect. Neuro: Alert and oriented X 3. Moves all extremities spontaneously. HEENT: Normal  Neck: Supple without bruits or JVD. Lungs:  Resp regular and unlabored, CTA. Heart: RRR no s3, s4, or murmurs. Abdomen: Soft, non-tender, non-distended, BS + x 4.  Extremities: No clubbing, cyanosis or edema. DP/PT/Radials 2+ and equal bilaterally. Significant scoliosis noted.  Labs  No results for input(s): CKTOTAL, CKMB, TROPONINI in the last 72 hours. Lab Results  Component Value Date   WBC 6.0 07/04/2015   HGB 10.0* 07/04/2015   HCT 31.3* 07/04/2015   MCV 84.6 07/04/2015   PLT 364 07/04/2015     Recent Labs Lab 07/03/15  1807 07/04/15 0805  NA 135 139  K 3.9 5.0  CL 101 107  CO2 25 21*  BUN 15 10  CREATININE 0.94 0.81  CALCIUM 9.5 9.4  PROT 7.3  --   BILITOT 0.5  --   ALKPHOS 87  --   ALT 13*  --   AST 24  --   GLUCOSE 109* 85   Lab Results  Component Value Date   CHOL 291* 12/20/2014   HDL 44 12/20/2014   LDLCALC 211* 12/20/2014   TRIG 178* 12/20/2014   Lab Results  Component Value Date   DDIMER 1.66* 07/17/2014    Radiology/Studies  Dg Chest Port 1 View  07/03/2015  CLINICAL DATA:  Syncope today, urinary tract infection EXAM: PORTABLE CHEST 1 VIEW COMPARISON:  06/25/2015 FINDINGS: Significant elevation of the right diaphragm stable. Heart size normal. Lungs clear. IMPRESSION: No active disease. Electronically Signed   By: Esperanza Heir M.D.   On: 07/03/2015 17:30   Dg Chest Port 1 View  06/25/2015  CLINICAL DATA:  81 year old female with urinary tract infection and persistent cough. EXAM: PORTABLE CHEST 1 VIEW COMPARISON:  Chest x-ray 08/07/2014. FINDINGS: Lung volumes are low. Chronic elevation of the right hemidiaphragm is unchanged. No acute consolidative airspace disease. No pleural effusions. No  pneumothorax. No suspicious appearing pulmonary nodules or masses. Retrocardiac density compatible with a large hiatal hernia. Heart size is normal. Upper mediastinal contours are within normal limits allowing for patient positioning. Atherosclerosis in the thoracic aorta. IMPRESSION: 1. Low lung volumes without radiographic evidence of acute cardiopulmonary disease. 2. Chronic elevation of the right hemidiaphragm is unchanged. 3. Large hiatal hernia. 4. Atherosclerosis. Electronically Signed   By: Trudie Reed M.D.   On: 06/25/2015 10:19    ECG  Normal sinus rhythm, difficult to interpret ST-T wave changes given underlying artifact.  ASSESSMENT AND PLAN  1. Recurrent syncope likely vasovagal/neurally mediated   - was having bowel movement when syncope occurred. Was also recently treated for UTI.   - Loop recorder interrogated, no obvious sinus pause noted expect 3 sec pause on 10/19. Does have bursts of sinus tachycardia on telemetry, however very transient, and unlikely to explain her symptom, would not start any rate control medication as she has previous 3rd degree HB with BB  - low suspicion that her symptom was related to an arrhythmia  - keep hydration   2. Recurrent UTI: per IM 3. HTN: SBP 180s, now 130s which is reasonable for her. 4. CKD stage III 5. CVA 6. DVT 7. HLD 8. complete AV block on BB  Signed, Azalee Course, [place] 07/04/2015, 5:48 PM   The patient was seen, examined and discussed with Azalee Course, PA-C and I agree with the above.   A pleasantly demented 81 year old female with recurrent syncope - vasovagal character, documented bradycardia with straining the last year, now with implanted loop recorder. Interrogation showed by bradycardia today, there was a 3 second pause on 06/28/2015 but the patient was asymptomatic. She is bed ridden and has to be helped to the restroom.   Today's episode happened with straining again, she also has recent two episodes ot UTI and poor  oral intake. Its possible that this was a combination or vasodepressant syncope enhanced by dehydration. The best approach is prevention of falls and good hydration. Explained to her daughter. No further cardiac workup is necessary.  Lars Masson 07/04/2015

## 2015-07-04 NOTE — Progress Notes (Addendum)
ANTIBIOTIC CONSULT NOTE - INITIAL  Pharmacy Consult for vancomycin Indication: UTI  No Known Allergies  Patient Measurements: Height: 5\' 2"  (157.5 cm) Weight: 126 lb 6.4 oz (57.335 kg) IBW/kg (Calculated) : 50.1  Vital Signs: Temp: 98.3 F (36.8 C) (10/24 2253) Temp Source: Oral (10/24 2253) BP: 171/74 mmHg (10/24 2253) Pulse Rate: 90 (10/24 2253)  Labs:  Recent Labs  07/03/15 1807  WBC 11.9*  HGB 11.2*  PLT 393  CREATININE 0.94   Estimated Creatinine Clearance: 34.6 mL/min (by C-G formula based on Cr of 0.94).   Microbiology: Recent Results (from the past 720 hour(s))  Urine culture     Status: None   Collection Time: 06/10/15  5:11 PM  Result Value Ref Range Status   Specimen Description URINE, CATHETERIZED  Final   Special Requests NONE  Final   Culture >=100,000 COLONIES/mL ESCHERICHIA COLI  Final   Report Status 06/12/2015 FINAL  Final   Organism ID, Bacteria ESCHERICHIA COLI  Final      Susceptibility   Escherichia coli - MIC*    AMPICILLIN <=2 SENSITIVE Sensitive     CEFAZOLIN <=4 SENSITIVE Sensitive     CEFTRIAXONE <=1 SENSITIVE Sensitive     CIPROFLOXACIN >=4 RESISTANT Resistant     GENTAMICIN <=1 SENSITIVE Sensitive     IMIPENEM <=0.25 SENSITIVE Sensitive     NITROFURANTOIN <=16 SENSITIVE Sensitive     TRIMETH/SULFA >=320 RESISTANT Resistant     AMPICILLIN/SULBACTAM <=2 SENSITIVE Sensitive     PIP/TAZO <=4 SENSITIVE Sensitive     * >=100,000 COLONIES/mL ESCHERICHIA COLI  Urine culture     Status: None   Collection Time: 06/25/15 10:38 AM  Result Value Ref Range Status   Specimen Description URINE, CATHETERIZED  Final   Special Requests Normal  Final   Culture >=100,000 COLONIES/mL ESCHERICHIA COLI  Final   Report Status 06/27/2015 FINAL  Final   Organism ID, Bacteria ESCHERICHIA COLI  Final      Susceptibility   Escherichia coli - MIC*    AMPICILLIN <=2 SENSITIVE Sensitive     CEFAZOLIN <=4 SENSITIVE Sensitive     CEFTRIAXONE <=1 SENSITIVE  Sensitive     CIPROFLOXACIN >=4 RESISTANT Resistant     GENTAMICIN <=1 SENSITIVE Sensitive     IMIPENEM <=0.25 SENSITIVE Sensitive     NITROFURANTOIN <=16 SENSITIVE Sensitive     TRIMETH/SULFA >=320 RESISTANT Resistant     AMPICILLIN/SULBACTAM <=2 SENSITIVE Sensitive     PIP/TAZO <=4 SENSITIVE Sensitive     * >=100,000 COLONIES/mL ESCHERICHIA COLI  Urine culture     Status: None   Collection Time: 06/28/15 10:00 AM  Result Value Ref Range Status   Culture ENTEROCOCCUS SPECIES  Final   Colony Count >=100,000 COLONIES/ML  Final   Organism ID, Bacteria ENTEROCOCCUS SPECIES  Final      Susceptibility   Enterococcus species -  (no method available)    AMPICILLIN >=32 Resistant     LEVOFLOXACIN >=8 Resistant     NITROFURANTOIN 128 Resistant     VANCOMYCIN <=0.5 Sensitive     TETRACYCLINE >=16 Resistant     Medical History: Past Medical History  Diagnosis Date  . Hypertension   . GERD (gastroesophageal reflux disease)   . H/O hiatal hernia   . Arthritis   . Syncope 07/13/2014  . Bladder infection, chronic     [name]/notes 08/30/2014  . AV block, complete (HCC)   . Chest pain   . Decreased dorsalis pedis pulse   . Hammer toe   .  Hyperlipidemia   . Onychogryposis of toenail   . Osteoporosis   . Scoliosis   . Status post placement of implantable loop recorder   . Syncope   . Uterine disorder   . Diaphragmatic hernia   . Hx of epistaxis   . Chest pain   . Iron deficiency anemia   . Chronic back pain   . Dementia   . Chronic kidney disease (CKD), stage III (moderate)   . Recurrent UTI (urinary tract infection)   . UTI (urinary tract infection) 04/2015    Medications:  Prescriptions prior to admission  Medication Sig Dispense Refill Last Dose  . acetaminophen (TYLENOL) 500 MG tablet Take 1 tablet (500 mg total) by mouth every 6 (six) hours as needed. (Patient taking differently: Take 500 mg by mouth 4 (four) times daily. ) 30 tablet 0 07/03/2015 at Unknown time  . amLODipine  (NORVASC) 2.5 MG tablet Take 1 tablet (2.5 mg total) by mouth daily. 90 tablet 1 07/03/2015 at Unknown time  . cholecalciferol (VITAMIN D) 1000 UNITS tablet Take 1,000 Units by mouth daily.   07/03/2015 at Unknown time  . CRANBERRY PO Take 240 mLs by mouth 3 (three) times daily. Cranberry concentrate solution   07/03/2015 at Unknown time  . D-MANNOSE PO Take 1 tablet by mouth 2 (two) times daily.   07/03/2015 at Unknown time  . fluticasone (FLONASE) 50 MCG/ACT nasal spray Place 2 sprays into both nostrils daily. (Patient taking differently: Place 2 sprays into both nostrils daily as needed for allergies. ) 16 g 6 unknown  . gabapentin (NEURONTIN) 100 MG capsule Take 100 mg by mouth 4 (four) times daily as needed (pain). Take 1 capsule TID and may take 1 more if needed for breakthrough pain  2 07/03/2015 at Unknown time  . nystatin (MYCOSTATIN/NYSTOP) 100000 UNIT/GM POWD Apply to affected area twice daily. (Patient taking differently: Apply 1 g topically 2 (two) times daily as needed (affected areas). ) 60 g 1 07/02/2015 at Unknown time  . omeprazole (PRILOSEC) 20 MG capsule TAKE 1 CAPSULE (20 MG TOTAL) BY MOUTH DAILY. 90 capsule 1 07/03/2015 at Unknown time  . Probiotic Product (ALIGN) 4 MG CAPS Take 4 mg by mouth daily.   07/03/2015 at Unknown time  . Wheat Dextrin (BENEFIBER) POWD Take 5-10 mg by mouth daily. Take  daily, and if needed for constipation take another .   07/03/2015 at Unknown time  . cephALEXin (KEFLEX) 500 MG capsule Take 1 capsule (500 mg total) by mouth 4 (four) times daily. (Patient not taking: Reported on 07/03/2015) 28 capsule 0 Completed Course at Unknown time   Scheduled:  . acetaminophen  500 mg Oral Q6H WA  . amLODipine  2.5 mg Oral Daily  . cholecalciferol  1,000 Units Oral Daily  . enoxaparin (LOVENOX) injection  40 mg Subcutaneous Q24H  . gabapentin  100 mg Oral TID  . sodium chloride  3 mL Intravenous Q12H  . sodium chloride  3 mL Intravenous Q12H     Assessment: 81yo female was dx'd w/ UTI on 10/19 tx'd w/ Keflex followed by IM Rocephin, today had a syncopal event while on toilet at home, C/S now returned showing entercoccus sensitive only to vanc (has h/o VRE), to begin IV ABX.  Goal of Therapy:  Vancomycin trough level 10-15 mcg/ml  Plan:  Rec'd vanc 1g at [facility]; will continue with vancomycin  IV Q24H and monitor CBC, Cx, levels prn.  Vernard Gambles, PharmD, BCPS  07/04/2015,1:23 AM

## 2015-07-04 NOTE — Progress Notes (Signed)
Utilization review completed. Julanne Schlueter, RN, BSN. 

## 2015-07-05 ENCOUNTER — Telehealth: Payer: Self-pay | Admitting: Family Medicine

## 2015-07-05 NOTE — Telephone Encounter (Signed)
Spoke to daughter [name]. Patient has had episode of emesis prior to discharge (ate large breakfast and had dose of Fosfomycin), now has had a few episodes of dry heaves this afternoon, however over the past hour slightly improved, alert now. Will observe for now.

## 2015-07-05 NOTE — Discharge Summary (Signed)
Family Medicine Teaching [facility] Discharge Summary  [name] Medical record number: [id number] Date of birth: 04/08/1928 Age: 81 y.o. Gender: female Date of Admission: 07/03/2015  Date of Discharge: 07/05/2015 Admitting Physician: Carney LivingMarshall L Chambliss, MD  Primary Care Provider: Uvaldo RisingFLETKE, KYLE, J, MD Consultants: cardiology  Indication for Hospitalization: syncope  Discharge Diagnoses/Problem List:  Colonization vs. UTI Vasovagal Syncope  Disposition: home  Discharge Condition: stable  Discharge Exam:  Gen: frail-appearing, lying in bed with no acute distress Eyes: PERRLA, sclera anicteric Nares: clear, no erythema, swelling or congestion Oropharynx: clear, moist CV: RRR. S1 & S2 audible, no murmurs. 2+ radial and DP pulses bilaterally. Resp: no apparent WOB, CTAB. Abd: +BS. Soft, NDNT, no rebound or guarding.  Ext: No edema or gross deformities. Neuro: Alert and oriented to self, place and person, No gross focal deficits Brief Hospital Course:  Victoria HelperBetty C Zacharia is a 81 y.o. female presenting with syncopal episode. PMH is significant for recurrent UTI, Complete AV Block, HTN, CKD, h/o CVA, h/o DVT, HLD and h/o recurrent syncopal episodes.  According to the daughter, the patient's husband contacted the daughter yesterday as the patient was having a large bowel movement. The daughter rushed over to help clean up the mess, on arrival, patient's husband states she has just passed out. She did not fall, but was not responding. It is unknown how long she was out. Per report, she was only passed out for a few minutes. EMS was called and she was transferred to [facility] and was later [facility].  On arrival here patient was hemodynamically stable. Cardiology was consulted to interrogate her loop recorder, which showed no event at the time of syncope. Her syncope is likely vasovagal/neurally mediated as  patient was having bowel movement when it occurred.  Patient was clinically treated for UTI with ceftriaxone and cephalexin on 10/19. A urine culture obtained at that time grew enterococcus sensitive only to vancomycin. She [facility]. However, given her history of multiple urine cultures that grew different organisms (E. Coli, proteus and Klebsiella) at different time, her clinical picture and unremarkable UA at this admission, it was thought her urine culture is likely just colonization than UTI. As a result, vancomycin was discontinued. She was given a dose of fosfomycin 3 gm once and discharged home on hospital day 2.  Issues for Follow Up:  1. Syncopal episode: recurrent. Likely vasovagal 2. Colonization vs UTI: no signs and symptoms at this admission.   Significant Procedures: none  Significant Labs and Imaging:   Recent Labs Lab 07/03/15 1807 07/04/15 1124  WBC 11.9* 6.0  HGB 11.2* 10.0*  HCT 34.5* 31.3*  PLT 393 364    Recent Labs Lab 07/03/15 1807 07/04/15 0805  NA 135 139  K 3.9 5.0  CL 101 107  CO2 25 21*  GLUCOSE 109* 85  BUN 15 10  CREATININE 0.94 0.81  CALCIUM 9.5 9.4  ALKPHOS 87  --   AST 24  --   ALT 13*  --   ALBUMIN 4.2  --     Results/Tests Pending at Time of Discharge: none  Discharge Medications:    Medication List    STOP taking these medications        cephALEXin 500 MG capsule  Commonly known as:  KEFLEX      TAKE these medications        acetaminophen 500 MG tablet  Commonly known as:  TYLENOL  Take 1 tablet (500 mg total) by mouth every 6 (six) hours as needed.     ALIGN 4 MG Caps  Take 4 mg by mouth daily.     amLODipine 2.5 MG tablet  Commonly known as:  NORVASC  Take 1 tablet (2.5 mg total) by mouth daily.     BENEFIBER Powd  Take 5-10 mg by mouth daily. Take  daily, and if needed for constipation take another .     cholecalciferol 1000 UNITS tablet  Commonly known as:  VITAMIN D   Take 1,000 Units by mouth daily.     CRANBERRY PO  Take 240 mLs by mouth 3 (three) times daily. Cranberry concentrate solution     D-MANNOSE PO  Take 1 tablet by mouth 2 (two) times daily.     fluticasone 50 MCG/ACT nasal spray  Commonly known as:  FLONASE  Place 2 sprays into both nostrils daily.     gabapentin 100 MG capsule  Commonly known as:  NEURONTIN  Take 100 mg by mouth 4 (four) times daily as needed (pain). Take 1 capsule TID and may take 1 more if needed for breakthrough pain     nystatin 100000 UNIT/GM Powd  Apply to affected area twice daily.     omeprazole 20 MG capsule  Commonly known as:  PRILOSEC  TAKE 1 CAPSULE (20 MG TOTAL) BY MOUTH DAILY.        Discharge Instructions: Please refer to Patient Instructions section of EMR for full details.  Patient was counseled important signs and symptoms that should prompt return to medical care, changes in medications, dietary instructions, activity restrictions, and follow up appointments.   Follow-Up Appointments: Follow-up Information    Go to Uvaldo Rising, MD.   Specialty:  Family Medicine   Why:  at 11:00 am   Contact information:   [address] [place] [address] [phone]       Please follow up.   Why:  on 07/28/2015 at 11:00 am      Almon Hercules, MD 07/05/2015, 3:48 PM PGY-1, [facility] Family Medicine

## 2015-07-05 NOTE — Progress Notes (Signed)
Discharge instructions given to patient's daughter. Daughter verbalized understanding and all questions were answered.  

## 2015-07-05 NOTE — Care Management Note (Signed)
Case Management Note  Patient Details  Name: Victoria Lewis MRN: [id number] Date of Birth: [birth year]  Subjective/Objective:        CM following for progression and d/c planning.            Action/Plan: 07/05/2015 No d[facility] needs for [facility] or DME identified.   Expected Discharge Date:      07/05/2015             Expected Discharge Plan:  Home/Self Care  In-House Referral:  NA  Discharge planning Services  NA  Post Acute Care Choice:  NA Choice offered to:  NA  DME Arranged:    DME Agency:     HH Arranged:    HH Agency:     Status of Service:  Completed, signed off  Medicare Important Message Given:    Date Medicare IM Given:    Medicare IM give by:    Date Additional Medicare IM Given:    Additional Medicare Important Message give by:     If discussed at Long Length of Stay Meetings, dates discussed:    Additional Comments:  [name], [name], RN 07/05/2015, 4:18 PM

## 2015-07-05 NOTE — Telephone Encounter (Signed)
Pt daughter calling and explains that the pt has just gotten out of the hospital and would like for Dr. Randolm IdolFletke to return her call as she has a question about one of the antibiotics given to her. Sadie Reynolds, ASA

## 2015-07-05 NOTE — Discharge Instructions (Signed)
It has been a pleasure taking care of you! You were admitted due to syncope and urinary tract infection. We gave you medication to treat your urinary infection. The heart doctors looked in to your loop recorder and didn't notice anything that has contributed to your syncope. Your syncope is likely related to bowel movement. So, we will be discharging you today.  We may have made some adjustments to your other medications. Please, make sure to read the directions before you take them. The names and directions on how to take these medications are found on this discharge paper under medication section.  You also need a follow up with your primary care doctor. The address, date and time are found on the discharge paper under follow up section.  Take care,  Syncope Syncope is a medical term for fainting or passing out. This means you lose consciousness and drop to the ground. People are generally unconscious for less than 5 minutes. You may have some muscle twitches for up to 15 seconds before waking up and returning to normal. Syncope occurs more often in older adults, but it can happen to anyone. While most causes of syncope are not dangerous, syncope can be a sign of a serious medical problem. It is important to seek medical care.  CAUSES  Syncope is caused by a sudden drop in blood flow to the brain. The specific cause is often not determined. Factors that can bring on syncope include:  Taking medicines that lower blood pressure.  Sudden changes in posture, such as standing up quickly.  Taking more medicine than prescribed.  Standing in one place for too long.  Seizure disorders.  Dehydration and excessive exposure to heat.  Low blood sugar (hypoglycemia).  Straining to have a bowel movement.  Heart disease, irregular heartbeat, or other circulatory problems.  Fear, emotional distress, seeing blood, or severe pain. SYMPTOMS  Right before fainting, you may:  Feel dizzy or  light-headed.  Feel nauseous.  See all white or all black in your field of vision.  Have cold, clammy skin. DIAGNOSIS  Your health care provider will ask about your symptoms, perform a physical exam, and perform an electrocardiogram (ECG) to record the electrical activity of your heart. Your health care provider may also perform other heart or blood tests to determine the cause of your syncope which may include:  Transthoracic echocardiogram (TTE). During echocardiography, sound waves are used to evaluate how blood flows through your heart.  Transesophageal echocardiogram (TEE).  Cardiac monitoring. This allows your health care provider to monitor your heart rate and rhythm in real time.  Holter monitor. This is a portable device that records your heartbeat and can help diagnose heart arrhythmias. It allows your health care provider to track your heart activity for several days, if needed.  Stress tests by exercise or by giving medicine that makes the heart beat faster. TREATMENT  In most cases, no treatment is needed. Depending on the cause of your syncope, your health care provider may recommend changing or stopping some of your medicines. HOME CARE INSTRUCTIONS  Have someone stay with you until you feel stable.  Do not drive, use machinery, or play sports until your health care provider says it is okay.  Keep all follow-up appointments as directed by your health care provider.  Lie down right away if you start feeling like you might faint. Breathe deeply and steadily. Wait until all the symptoms have passed.  Drink enough fluids to keep your urine clear or  pale yellow.  If you are taking blood pressure or heart medicine, get up slowly and take several minutes to sit and then stand. This can reduce dizziness. SEEK IMMEDIATE MEDICAL CARE IF:   You have a severe headache.  You have unusual pain in the chest, abdomen, or back.  You are bleeding from your mouth or rectum, or you  have black or tarry stool.  You have an irregular or very fast heartbeat.  You have pain with breathing.  You have repeated fainting or seizure-like jerking during an episode.  You faint when sitting or lying down.  You have confusion.  You have trouble walking.  You have severe weakness.  You have vision problems. If you fainted, call your local emergency services (911 in [place] Do not drive yourself to the hospital.    This information is not intended to replace advice given to you by your health care provider. Make sure you discuss any questions you have with your health care provider.   Document Released: 08/26/2005 Document Revised: 01/10/2015 Document Reviewed: 10/25/2011 Elsevier Interactive Patient Education [employer] Inc.

## 2015-07-06 ENCOUNTER — Telehealth: Payer: Self-pay | Admitting: Family Medicine

## 2015-07-06 NOTE — Telephone Encounter (Signed)
Returned call. Patient had another episode of "falling out", had EMS come to house and now back to baseline, ate well this AM however did have "gagging" episode, somewhat sleepy otherwise unremarkable. Will continue monitor.

## 2015-07-06 NOTE — Telephone Encounter (Signed)
Daughter is calling because her mother came home from the hospital yesterday. She found her mother unresponsive and called 911. Her mother came too and the EMT's said that her blood sugar was high but that can be because of the antibiotics. She would like to speak to Dr. Randolm IdolFletke to see if what they should do. jw

## 2015-07-08 LAB — CULTURE, BLOOD (ROUTINE X 2)
CULTURE: NO GROWTH
Culture: NO GROWTH

## 2015-07-10 ENCOUNTER — Telehealth: Payer: Self-pay | Admitting: *Deleted

## 2015-07-10 ENCOUNTER — Telehealth: Payer: Self-pay | Admitting: Internal Medicine

## 2015-07-10 NOTE — Telephone Encounter (Signed)
Called patient's daughter [facility] regarding pause episodes on LINQ transmission to see if episode from 07/05/15 correlates with "falling out" episodes documented in phone note from 07/05/15.  She requested that I call her sister/patient's daughter, Victoria Lewis, to discuss the times of the episodes.  [name] states that "there have been so many, I don't know", but requested that I call back later as she was driving.  Will call back to discuss episodes and any possible correlation with pause episode documented on LINQ transmission.

## 2015-07-10 NOTE — Telephone Encounter (Signed)
Returned Anne's call.  Thurston Holenne states that on 07/05/15, her mother had multiple "falling out" episodes for which she and her siblings called EMS.  The first episode was between 12:00pm and 1:00pm, and the second episode was after 1:00pm, but Thurston Holenne is unsure of the exact time.  She states that her mother had more "episodes" on the evening of 07/06/15.  Thurston Holenne states that the patient's PCP believes that these episodes are due to the dose of fosfomycin that she received before leaving the hospital on 10/26.  She states that her mother is doing "much better" now because the "antibiotics are out of her system".  Requested a manual transmission to ensure there are no additional episodes for review.  Thurston Holenne states that she will have her brother send one tonight.  Will review with Dr. Graciela HusbandsKlein and discuss episodes with patient's daughter, Harriett Sineancy, per Anne's request.  She denies any additional questions or concerns at this time and voices appreciation of call back.

## 2015-07-10 NOTE — Telephone Encounter (Signed)
° °  Calling to confirm she did manual transmission 10 minutes ago.

## 2015-07-10 NOTE — Telephone Encounter (Signed)
Returned call.  See other phone note for additional details.

## 2015-07-10 NOTE — Telephone Encounter (Signed)
Transmission successfully received.  Will review with Dr. Graciela HusbandsKlein when back in office and call patient/family back.

## 2015-07-10 NOTE — Telephone Encounter (Signed)
Follow Up   Pts daughter returning call from earlier. Please call.

## 2015-07-12 ENCOUNTER — Telehealth: Payer: Self-pay | Admitting: Internal Medicine

## 2015-07-12 NOTE — Telephone Encounter (Signed)
Called Harriett SineNancy, patient's daughter, to discuss Dr. Odessa FlemingKlein's recommendations.  Per Dr. Graciela HusbandsKlein, patient may benefit from pacemaker for symptomatic CHB episodes on LINQ transmissions and should be seen in office if patient and family are agreeable to discussion.  Nancy aware, agreeable to appointment on 07/17/15 at 8:30am.  Requested that she also involve patient's other family members in this discussion.  Harriett SineNancy appreciative of call and denies questions or concerns at this time.  Aware to call with worsening symptoms or questions.

## 2015-07-12 NOTE — Telephone Encounter (Signed)
Follow up      You do not have to call---daughter talked to her sister.

## 2015-07-12 NOTE — Telephone Encounter (Signed)
New message     Daughter wants mom to have to procedure wants to discuss.

## 2015-07-14 ENCOUNTER — Ambulatory Visit: Payer: Medicare Other | Admitting: Neurology

## 2015-07-16 ENCOUNTER — Encounter (HOSPITAL_COMMUNITY): Payer: Self-pay | Admitting: Emergency Medicine

## 2015-07-16 ENCOUNTER — Emergency Department (HOSPITAL_COMMUNITY)
Admission: EM | Admit: 2015-07-16 | Discharge: 2015-07-16 | Disposition: A | Discharge status: Home / Self Care | Payer: Medicare Other | Attending: Emergency Medicine | Admitting: Emergency Medicine

## 2015-07-16 ENCOUNTER — Emergency Department (HOSPITAL_COMMUNITY): Payer: Medicare Other

## 2015-07-16 ENCOUNTER — Other Ambulatory Visit: Payer: Self-pay

## 2015-07-16 DIAGNOSIS — F0390 Unspecified dementia without behavioral disturbance: Secondary | ICD-10-CM | POA: Diagnosis not present

## 2015-07-16 DIAGNOSIS — Z8639 Personal history of other endocrine, nutritional and metabolic disease: Secondary | ICD-10-CM | POA: Diagnosis not present

## 2015-07-16 DIAGNOSIS — M1990 Unspecified osteoarthritis, unspecified site: Secondary | ICD-10-CM | POA: Diagnosis not present

## 2015-07-16 DIAGNOSIS — Z872 Personal history of diseases of the skin and subcutaneous tissue: Secondary | ICD-10-CM | POA: Diagnosis not present

## 2015-07-16 DIAGNOSIS — N183 Chronic kidney disease, stage 3 (moderate): Secondary | ICD-10-CM | POA: Diagnosis not present

## 2015-07-16 DIAGNOSIS — M810 Age-related osteoporosis without current pathological fracture: Secondary | ICD-10-CM | POA: Insufficient documentation

## 2015-07-16 DIAGNOSIS — Z79899 Other long term (current) drug therapy: Secondary | ICD-10-CM | POA: Diagnosis not present

## 2015-07-16 DIAGNOSIS — K219 Gastro-esophageal reflux disease without esophagitis: Secondary | ICD-10-CM | POA: Insufficient documentation

## 2015-07-16 DIAGNOSIS — R8299 Other abnormal findings in urine: Secondary | ICD-10-CM | POA: Diagnosis present

## 2015-07-16 DIAGNOSIS — I129 Hypertensive chronic kidney disease with stage 1 through stage 4 chronic kidney disease, or unspecified chronic kidney disease: Secondary | ICD-10-CM | POA: Diagnosis not present

## 2015-07-16 DIAGNOSIS — M419 Scoliosis, unspecified: Secondary | ICD-10-CM | POA: Diagnosis not present

## 2015-07-16 DIAGNOSIS — Z862 Personal history of diseases of the blood and blood-forming organs and certain disorders involving the immune mechanism: Secondary | ICD-10-CM | POA: Diagnosis not present

## 2015-07-16 DIAGNOSIS — Z87440 Personal history of urinary (tract) infections: Secondary | ICD-10-CM | POA: Insufficient documentation

## 2015-07-16 DIAGNOSIS — R109 Unspecified abdominal pain: Secondary | ICD-10-CM | POA: Diagnosis not present

## 2015-07-16 DIAGNOSIS — R55 Syncope and collapse: Secondary | ICD-10-CM | POA: Diagnosis not present

## 2015-07-16 DIAGNOSIS — G8929 Other chronic pain: Secondary | ICD-10-CM | POA: Insufficient documentation

## 2015-07-16 DIAGNOSIS — R8290 Unspecified abnormal findings in urine: Secondary | ICD-10-CM

## 2015-07-16 LAB — URINALYSIS, ROUTINE W REFLEX MICROSCOPIC
Bilirubin Urine: NEGATIVE
GLUCOSE, UA: NEGATIVE mg/dL
HGB URINE DIPSTICK: NEGATIVE
KETONES UR: NEGATIVE mg/dL
LEUKOCYTES UA: NEGATIVE
Nitrite: NEGATIVE
PROTEIN: NEGATIVE mg/dL
Specific Gravity, Urine: 1.011 (ref 1.005–1.030)
UROBILINOGEN UA: 0.2 mg/dL (ref 0.0–1.0)
pH: 7 (ref 5.0–8.0)

## 2015-07-16 LAB — COMPREHENSIVE METABOLIC PANEL
ALT: 13 U/L — ABNORMAL LOW (ref 14–54)
ANION GAP: 16 — AB (ref 5–15)
AST: 27 U/L (ref 15–41)
Albumin: 4 g/dL (ref 3.5–5.0)
Alkaline Phosphatase: 85 U/L (ref 38–126)
BUN: 9 mg/dL (ref 6–20)
CHLORIDE: 96 mmol/L — AB (ref 101–111)
CO2: 23 mmol/L (ref 22–32)
Calcium: 9.8 mg/dL (ref 8.9–10.3)
Creatinine, Ser: 0.95 mg/dL (ref 0.44–1.00)
GFR calc non Af Amer: 53 mL/min — ABNORMAL LOW (ref >60)
Glucose, Bld: 108 mg/dL — ABNORMAL HIGH (ref 65–99)
POTASSIUM: 3.5 mmol/L (ref 3.5–5.1)
Sodium: 135 mmol/L (ref 135–145)
Total Bilirubin: 0.6 mg/dL (ref 0.3–1.2)
Total Protein: 7.3 g/dL (ref 6.5–8.1)

## 2015-07-16 LAB — CBC WITH DIFFERENTIAL/PLATELET
Basophils Absolute: 0 10*3/uL (ref 0.0–0.1)
Basophils Relative: 0 %
Eosinophils Absolute: 0.1 10*3/uL (ref 0.0–0.7)
Eosinophils Relative: 1 %
HEMATOCRIT: 36 % (ref 36.0–46.0)
HEMOGLOBIN: 11.6 g/dL — AB (ref 12.0–15.0)
LYMPHS ABS: 0.9 10*3/uL (ref 0.7–4.0)
Lymphocytes Relative: 9 %
MCH: 27 pg (ref 26.0–34.0)
MCHC: 32.2 g/dL (ref 30.0–36.0)
MCV: 83.9 fL (ref 78.0–100.0)
MONOS PCT: 6 %
Monocytes Absolute: 0.6 10*3/uL (ref 0.1–1.0)
NEUTROS ABS: 8.5 10*3/uL — AB (ref 1.7–7.7)
NEUTROS PCT: 84 %
Platelets: 423 10*3/uL — ABNORMAL HIGH (ref 150–400)
RBC: 4.29 MIL/uL (ref 3.87–5.11)
RDW: 15.2 % (ref 11.5–15.5)
WBC: 10.2 10*3/uL (ref 4.0–10.5)

## 2015-07-16 LAB — I-STAT CG4 LACTIC ACID, ED
LACTIC ACID, VENOUS: 2.75 mmol/L — AB (ref 0.5–2.0)
Lactic Acid, Venous: 1.58 mmol/L (ref 0.5–2.0)

## 2015-07-16 MED ORDER — SODIUM CHLORIDE 0.9 % IV BOLUS (SEPSIS)
1000.0000 mL | Freq: Once | INTRAVENOUS | Status: AC
  Administered 2015-07-16: 1000 mL via INTRAVENOUS

## 2015-07-16 NOTE — ED Provider Notes (Signed)
CSN: [id number]     Arrival date & time 07/16/15  1604 History   First MD Initiated Contact with Patient 07/16/15 1625     Chief Complaint  Patient presents with  . Abdominal Pain  . foul smelling urine    . Loss of Consciousness     (Consider location/radiation/quality/duration/timing/severity/associated sxs/prior Treatment) Patient is a 81 y.o. female presenting with abdominal pain and syncope. The history is provided by the patient and a relative. The history is limited by the condition of the patient.  Abdominal Pain Loss of Consciousness Patient w hx dementia, utis, presents w family w concern for possible uti.  They indicate earlier today pt seemed tired, slow to respond, but now her mental status appears c/w baseline. They also indicate pts urine smelled very strong today, similar to with prior utis. No current abx use. Normal po intake for pt, which is small. No vomiting or diarrhea. Pt denies specific c/o or pain. Pt is very limited historian given baseline dementia - level 5 caveat. No fevers at home. At baseline, pt assists w transfers and can walk a few steps w assistance.  No acute or abrupt decrease in patients functional baseline ability.        Past Medical History  Diagnosis Date  . Hypertension   . GERD (gastroesophageal reflux disease)   . H/O hiatal hernia   . Arthritis   . Syncope 07/13/2014  . Bladder infection, chronic     [name] 08/30/2014  . AV block, complete (HCC)   . Chest pain   . Decreased dorsalis pedis pulse   . Hammer toe   . Hyperlipidemia   . Onychogryposis of toenail   . Osteoporosis   . Scoliosis   . Status post placement of implantable loop recorder   . Syncope   . Uterine disorder   . Diaphragmatic hernia   . Hx of epistaxis   . Chest pain   . Iron deficiency anemia   . Chronic back pain   . Dementia   . Chronic kidney disease (CKD), stage III (moderate)   . Recurrent UTI (urinary tract infection)   . UTI (urinary tract  infection) 04/2015   Past Surgical History  Procedure Laterality Date  . Joint replacement    . Cataract extraction, bilateral Bilateral   . Total hip arthroplasty Right 10/11/2013    Procedure: TOTAL HIP ARTHROPLASTY;  Surgeon: Nestor Lewandowsky, MD;  Location: [facility] OR;  Service: Orthopedics;  Laterality: Right;  . Loop recorder implant    . Tubal ligation    . Total hip arthroplasty Left 05/2006    [name] 01/22/2011   Family History  Problem Relation Age of Onset  . Stroke Mother 32    died of stroke  . Hypertension Mother   . Heart disease Mother    Social History  Substance Use Topics  . Smoking status: Never Smoker   . Smokeless tobacco: Never Used  . Alcohol Use: No   OB History    No data available       Review of Systems  Unable to perform ROS: Dementia  Cardiovascular: Positive for syncope.  Gastrointestinal: Positive for abdominal pain.  level 5 caveat - dementia    Allergies  Review of patient's allergies indicates no known allergies.  Home Medications   Prior to Admission medications   Medication Sig Start Date End Date Taking? Authorizing Provider  acetaminophen (TYLENOL) 500 MG tablet Take 1 tablet (500 mg total) by mouth every 6 (six)  hours as needed. Patient taking differently: Take 500 mg by mouth 4 (four) times daily.  09/01/14   Glori Luis, MD  amLODipine (NORVASC) 2.5 MG tablet Take 1 tablet (2.5 mg total) by mouth daily. 06/16/15   Uvaldo Rising, MD  cholecalciferol (VITAMIN D) 1000 UNITS tablet Take 1,000 Units by mouth daily.    Historical Provider, MD  CRANBERRY PO Take 240 mLs by mouth 3 (three) times daily. Cranberry concentrate solution    Historical Provider, MD  D-MANNOSE PO Take 1 tablet by mouth 2 (two) times daily.    Historical Provider, MD  fluticasone (FLONASE) 50 MCG/ACT nasal spray Place 2 sprays into both nostrils daily. Patient taking differently: Place 2 sprays into both nostrils daily as needed for allergies.  12/05/14   Uvaldo Rising, MD  gabapentin (NEURONTIN) 100 MG capsule Take 100 mg by mouth 4 (four) times daily as needed (pain). Take 1 capsule TID and may take 1 more if needed for breakthrough pain 07/28/14   Historical Provider, MD  nystatin (MYCOSTATIN/NYSTOP) 100000 UNIT/GM POWD Apply to affected area twice daily. Patient taking differently: Apply 1 g topically 2 (two) times daily as needed (affected areas).  02/28/15   Uvaldo Rising, MD  omeprazole (PRILOSEC) 20 MG capsule TAKE 1 CAPSULE (20 MG TOTAL) BY MOUTH DAILY. 05/08/15   Uvaldo Rising, MD  Probiotic Product (ALIGN) 4 MG CAPS Take 4 mg by mouth daily.    Historical Provider, MD  Wheat Dextrin (BENEFIBER) POWD Take 5-10 mg by mouth daily. Take  daily, and if needed for constipation take another .    Historical Provider, MD   BP 184/84 mmHg  Pulse 91  Temp(Src) 98.1 F (36.7 C) (Oral)  Resp 18  SpO2 99% Physical Exam  Constitutional: She appears well-developed and well-nourished. No distress.  HENT:  Head: Atraumatic.  Mouth/Throat: Oropharynx is clear and moist.  Eyes: Conjunctivae are normal. Pupils are equal, round, and reactive to light. No scleral icterus.  Neck: Neck supple. No tracheal deviation present.  No stiffness or rigidity  Cardiovascular: Normal rate, regular rhythm, normal heart sounds and intact distal pulses.   Pulmonary/Chest: Effort normal and breath sounds normal. No respiratory distress.  Abdominal: Soft. Normal appearance and bowel sounds are normal. She exhibits no distension. There is no tenderness.  Genitourinary:  No cva tenderness  Musculoskeletal: She exhibits no edema.  Neurological: She is alert.  Pt awake and alert, confused. Mental status at baseline per family member  Skin: Skin is warm and dry. No rash noted.  Psychiatric: She has a normal mood and affect.  Nursing note and vitals reviewed.   ED Course  Procedures (including critical care time) Labs Review   Results for orders placed or performed  during the hospital encounter of 07/16/15  Comprehensive metabolic panel  Result Value Ref Range   Sodium 135 135 - 145 mmol/L   Potassium 3.5 3.5 - 5.1 mmol/L   Chloride 96 (L) 101 - 111 mmol/L   CO2 23 22 - 32 mmol/L   Glucose, Bld 108 (H) 65 - 99 mg/dL   BUN 9 6 - 20 mg/dL   Creatinine, Ser 1.61 0.44 - 1.00 mg/dL   Calcium 9.8 8.9 - 09.6 mg/dL   Total Protein 7.3 6.5 - 8.1 g/dL   Albumin 4.0 3.5 - 5.0 g/dL   AST 27 15 - 41 U/L   ALT 13 (L) 14 - 54 U/L   Alkaline Phosphatase 85 38 - 126 U/L  Total Bilirubin 0.6 0.3 - 1.2 mg/dL   GFR calc non Af Amer 53 (L) >60 mL/min   GFR calc Af Amer >60 >60 mL/min   Anion gap 16 (H) 5 - 15  CBC with Differential  Result Value Ref Range   WBC 10.2 4.0 - 10.5 K/uL   RBC 4.29 3.87 - 5.11 MIL/uL   Hemoglobin 11.6 (L) 12.0 - 15.0 g/dL   HCT 40.936.0 81.136.0 - 91.446.0 %   MCV 83.9 78.0 - 100.0 fL   MCH 27.0 26.0 - 34.0 pg   MCHC 32.2 30.0 - 36.0 g/dL   RDW 78.215.2 95.611.5 - 21.315.5 %   Platelets 423 (H) 150 - 400 K/uL   Neutrophils Relative % 84 %   Neutro Abs 8.5 (H) 1.7 - 7.7 K/uL   Lymphocytes Relative 9 %   Lymphs Abs 0.9 0.7 - 4.0 K/uL   Monocytes Relative 6 %   Monocytes Absolute 0.6 0.1 - 1.0 K/uL   Eosinophils Relative 1 %   Eosinophils Absolute 0.1 0.0 - 0.7 K/uL   Basophils Relative 0 %   Basophils Absolute 0.0 0.0 - 0.1 K/uL  Urinalysis, Routine w reflex microscopic (not at [facility])  Result Value Ref Range   Color, Urine YELLOW YELLOW   APPearance CLEAR CLEAR   Specific Gravity, Urine 1.011 1.005 - 1.030   pH 7.0 5.0 - 8.0   Glucose, UA NEGATIVE NEGATIVE mg/dL   Hgb urine dipstick NEGATIVE NEGATIVE   Bilirubin Urine NEGATIVE NEGATIVE   Ketones, ur NEGATIVE NEGATIVE mg/dL   Protein, ur NEGATIVE NEGATIVE mg/dL   Urobilinogen, UA 0.2 0.0 - 1.0 mg/dL   Nitrite NEGATIVE NEGATIVE   Leukocytes, UA NEGATIVE NEGATIVE  I-Stat CG4 Lactic Acid, ED (Not at [facility])  Result Value Ref Range   Lactic Acid, Venous 2.75 (HH) 0.5 - 2.0 mmol/L   Comment  NOTIFIED PHYSICIAN   I-Stat CG4 Lactic Acid, ED (Not at [facility])  Result Value Ref Range   Lactic Acid, Venous 1.58 0.5 - 2.0 mmol/L   Dg Chest 2 View  07/16/2015  CLINICAL DATA:  Weakness and possible sepsis for 2 weeks. EXAM: CHEST  2 VIEW COMPARISON:  07/03/2015 FINDINGS: The heart is within normal limits in size. Mild tortuosity and calcification of the thoracic aorta. Stable large hiatal hernia and marked eventration of the right hemidiaphragm. There is overlying vascular crowding and atelectasis. A loop recorder is noted. No definite pleural effusion or pneumothorax. The bony thorax demonstrates stable degenerative changes and compression fractures. IMPRESSION: Chronic changes.  No acute pulmonary findings. Electronically Signed   By: Rudie MeyerP.  Gallerani M.D.   On: 07/16/2015 18:12   Dg Chest Port 1 View  07/03/2015  CLINICAL DATA:  Syncope today, urinary tract infection EXAM: PORTABLE CHEST 1 VIEW COMPARISON:  06/25/2015 FINDINGS: Significant elevation of the right diaphragm stable. Heart size normal. Lungs clear. IMPRESSION: No active disease. Electronically Signed   By: Esperanza Heiraymond  Rubner M.D.   On: 07/03/2015 17:30   Dg Chest Port 1 View  06/25/2015  CLINICAL DATA:  81 year old female with urinary tract infection and persistent cough. EXAM: PORTABLE CHEST 1 VIEW COMPARISON:  Chest x-ray 08/07/2014. FINDINGS: Lung volumes are low. Chronic elevation of the right hemidiaphragm is unchanged. No acute consolidative airspace disease. No pleural effusions. No pneumothorax. No suspicious appearing pulmonary nodules or masses. Retrocardiac density compatible with a large hiatal hernia. Heart size is normal. Upper mediastinal contours are within normal limits allowing for patient positioning. Atherosclerosis in the thoracic aorta. IMPRESSION:  1. Low lung volumes without radiographic evidence of acute cardiopulmonary disease. 2. Chronic elevation of the right hemidiaphragm is unchanged. 3. Large hiatal hernia. 4.  Atherosclerosis. Electronically Signed   By: Trudie Reed M.D.   On: 06/25/2015 10:19      I have personally reviewed and evaluated these images and lab results as part of my medical decision-making.    MDM   Iv ns. Labs sent by nursing.  Reviewed nursing notes and prior charts for additional history.   Iv ns bolus.   Nursing had sent initial labs, ?clinical signif initial elev lact - pt afeb, no report of fevers, no pain, benign exam, ?mild volume depletion.  Repeat lac normal.   Recheck pt, pt remains smiling, content, alert, and with mental status c/w baseline per family. Tolerating po. No new c/o.    Las unremarkable.  Pt currently appears stable for d/c.  rec close pcp f/u.   Cathren Laine, MD 07/16/15 2019

## 2015-07-16 NOTE — Discharge Instructions (Signed)
It was our pleasure to provide your ER care today - we hope that you feel better.  Drink plenty of fluids.  If limited oral intake, consider supplementing nutrition w boost, ensure, or other nutritious shake.   Follow up with primary care doctor.  Return to ER if worse, new symptoms, fevers, trouble breathing, persistent vomiting, medical emergency, other concern.

## 2015-07-16 NOTE — ED Notes (Signed)
C/o lower abd pain.  Family reports foul smelling urine and syncopal episode in bathroom this morning.  States patient was admitted on 10/24 for "urine infection".

## 2015-07-17 ENCOUNTER — Telehealth: Payer: Self-pay | Admitting: Family Medicine

## 2015-07-17 ENCOUNTER — Encounter: Payer: Self-pay | Admitting: Internal Medicine

## 2015-07-17 ENCOUNTER — Ambulatory Visit (INDEPENDENT_AMBULATORY_CARE_PROVIDER_SITE_OTHER): Payer: Medicare Other | Admitting: Internal Medicine

## 2015-07-17 VITALS — BP 168/80 | HR 97 | Ht 62.0 in | Wt 127.0 lb

## 2015-07-17 DIAGNOSIS — R55 Syncope and collapse: Secondary | ICD-10-CM | POA: Diagnosis not present

## 2015-07-17 DIAGNOSIS — I442 Atrioventricular block, complete: Secondary | ICD-10-CM

## 2015-07-17 DIAGNOSIS — Z4509 Encounter for adjustment and management of other cardiac device: Secondary | ICD-10-CM

## 2015-07-17 LAB — URINE CULTURE: Culture: NO GROWTH

## 2015-07-17 NOTE — Progress Notes (Signed)
Patient Care Team: Uvaldo Rising, MD as PCP - General (Family Medicine) Duke Salvia, MD as Consulting Physician (Cardiology) Esaw Dace, MD as Attending Physician (Urology) Maris Berger, MD as Consulting Physician (Ophthalmology) Harmon Dun, MD as Consulting Physician (Otolaryngology) Francoise Schaumann. Kaylyn Layer, DPM (Podiatry) Gean Birchwood, MD as Consulting Physician (Orthopedic Surgery) Neysa Bonito, MD (Psychiatry)   HPI  Victoria Lewis is a 81 y.o. female Seen in follow-up for syncope in the context of a previously implanted loop recorder this had demonstrated PP prolongation associated with complete heart block.  Loop recorder had been implanted previously at [facility].  Now w ? recurrent syncope--and again heart block documented but not simultaneously   She has had multiple episodes in these records are reviewed from the ER. We have attempted to get records from EMS, they are still pending. Interestingly, her recurrent syncopal episodes are NOT ASSOCIATED with any brady arrhythmia  She is noted by her son and husband not to be pale with these episodes. It may also be that she retains some bodily tone. Her eyes were also described as being closed..   Records and Results Reviewed incl MRI>> pror stroke    Has been on apixoban for DVT  Past Medical History  Diagnosis Date  . Hypertension   . GERD (gastroesophageal reflux disease)   . H/O hiatal hernia   . Arthritis   . Syncope 07/13/2014  . Bladder infection, chronic     Victoria Lewis 08/30/2014  . AV block, complete (HCC)   . Chest pain   . Decreased dorsalis pedis pulse   . Hammer toe   . Hyperlipidemia   . Onychogryposis of toenail   . Osteoporosis   . Scoliosis   . Status post placement of implantable loop recorder   . Syncope   . Uterine disorder   . Diaphragmatic hernia   . Hx of epistaxis   . Chest pain   . Iron deficiency anemia   . Chronic back pain   . Dementia   . Chronic kidney  disease (CKD), stage III (moderate)   . Recurrent UTI (urinary tract infection)   . UTI (urinary tract infection) 04/2015    Past Surgical History  Procedure Laterality Date  . Joint replacement    . Cataract extraction, bilateral Bilateral   . Total hip arthroplasty Right 10/11/2013    Procedure: TOTAL HIP ARTHROPLASTY;  Surgeon: Nestor Lewandowsky, MD;  Location: [facility] OR;  Service: Orthopedics;  Laterality: Right;  . Loop recorder implant    . Tubal ligation    . Total hip arthroplasty Left 05/2006    Victoria Lewis 01/22/2011    Current Outpatient Prescriptions  Medication Sig Dispense Refill  . acetaminophen (TYLENOL) 500 MG tablet Take one tablet (500 mg) by mouth four times a day    . amLODipine (NORVASC) 2.5 MG tablet daily. Take one tablet (2.5 mg) by mouth at 10:00 am    . cholecalciferol (VITAMIN D) 1000 UNITS tablet Take 1,000 Units by mouth daily.    Marland Kitchen CRANBERRY PO Take 240 mLs by mouth 3 (three) times daily. Cranberry concentrate solution    . D-MANNOSE PO Take 1 tablet by mouth 2 (two) times daily.    . fluticasone (FLONASE) 50 MCG/ACT nasal spray Place 2 sprays into both nostrils daily. 16 g 6  . gabapentin (NEURONTIN) 100 MG capsule Take 100 mg by mouth See admin instructions. Take 1 capsule three times each day; may take 1  more if needed for breakthrough pain  2  . nystatin (MYCOSTATIN/NYSTOP) 100000 UNIT/GM POWD Apply to affected area twice daily. (Patient taking differently: Apply 1 g topically 2 (two) times daily as needed (Fungal infection). ) 60 g 1  . omeprazole (PRILOSEC) 20 MG capsule Take one tablet (20 mg) by mouth every morning    . Probiotic Product (ALIGN) 4 MG CAPS Take 4 mg by mouth daily.    . Wheat Dextrin (BENEFIBER) POWD Take 5-10 mg by mouth See admin instructions. Take 5mg  daily, and if needed for constipation take another 5mg .     No current facility-administered medications for this visit.    No Known Allergies    Review of Systems negative except from HPI  and PMH  Physical Exam Pulse 97  Ht 5\' 2"  (1.575 m)  Wt 127 lb (57.607 kg)  BMI 23.22 kg/m2 Well developed and well nourished in no acute distress HENT normal E scleral and icterus clear Neck Supple JVP flat; carotids brisk and full Clear to ausculation  Regular rate and rhythm, no murmurs gallops or rub Soft with active bowel sounds No clubbing cyanosis  Edema Alert agrossly normal motor and sensory function Skin Warm and Dry    Assessment and  Plan  Syncope  LINQ  Stroke   Complete heart block  Hypertension  Dementia   She has had recurrent episodes aren't associated with brady arrhythmia; hence, as I have reviewed with the family there is no indication for pacing not withstanding the demonstrable heart block. I think it would avail us nothing  We discussed mechanisms of loss of consciousness including neurological or vasomotor. I've instructed her son as to how to take her pulse. This might help us. We are also trying to get EMS records.  In the event that this is vasomotor, which is I think the more likely, not withstanding the absence of pallor and her eyes not being open, I have suggested the use of an abdominal binder. There were also some data that Mestinon might be helpful in patients with supine hypertension. I will defer this to her primary care physician.

## 2015-07-17 NOTE — Telephone Encounter (Signed)
Daughter [name] called and would like to speak to Dr. Randolm IdolFletke about her mother. Myriam Jacobsonjw

## 2015-07-17 NOTE — Telephone Encounter (Signed)
RN staff - can you please call [name] and see if you can address any issues. Thanks

## 2015-07-17 NOTE — Patient Instructions (Signed)
Medication Instructions: - no changes  Labwork: - none  Procedures/Testing: - none  Follow-Up: - Dr. Klein will see you back on an as needed basis.  Any Additional Special Instructions Will Be Listed Below (If Applicable). - none  

## 2015-07-17 NOTE — Telephone Encounter (Signed)
Attempted to call ann back and the number i called kept ringing and doesn't have a voicemail. Will call try again in the morning. Deseree Bruna PotterBlount, CMA

## 2015-07-18 LAB — CUP PACEART INCLINIC DEVICE CHECK: MDC IDC SESS DTM: 20161107143707

## 2015-07-18 LAB — CUP PACEART REMOTE DEVICE CHECK: MDC IDC SESS DTM: 20161021010541

## 2015-07-18 NOTE — Progress Notes (Signed)
Carelink summary report received. Battery status OK. Normal device function. No new symptom, tachy, brady, or AF episodes. 1 pause episode, duration 3 sec. Monthly summary reports and ROV with SK on 07/17/15 8:30am.

## 2015-07-19 ENCOUNTER — Telehealth: Payer: Self-pay | Admitting: Family Medicine

## 2015-07-19 ENCOUNTER — Telehealth: Payer: Self-pay | Admitting: Cardiology

## 2015-07-19 NOTE — Telephone Encounter (Signed)
Harriett Sineancy called and would like to speak to Dr. Randolm IdolFletke about her mother passing out. She said she loop tracking in her and wanted to know can the doctor check the time from 6 pm 6:05 pm to see what it shows. Please call her and do not leave a message since other people have access to that phone. She will have the phone on her. jw

## 2015-07-19 NOTE — Telephone Encounter (Signed)
Returned patient's daughter's call.  Explained that her mother has not had any episodes on her Carelink transmissions since the patient was seen in the office by Dr. Graciela HusbandsKlein on 07/17/15 and that the transmissions are up to date as of today.  Patient's daughter voices understanding and appreciation.  She states that she is "not sure what to think anymore".  Reviewed notes from appointment with Dr. Graciela HusbandsKlein with patient's daughter.  She states that the patient may have been having an incontinent bowel movement at the time of the episode and that she understands that the patient's heart rhythm did not slow during the episode from the information available through the loop recorder.  She denies any additional questions or concerns at this time and voices appreciation of return call.  Patient's daughter is aware to call with worsening symptoms, questions, or concerns.

## 2015-07-19 NOTE — Telephone Encounter (Signed)
Pt daughter called and stated that pt had an episode where she got really hot, her eyes where glazed looking, and she almost fainted when they went to stand her up to take her to the dinner table to eat.

## 2015-07-19 NOTE — Telephone Encounter (Signed)
Harriett SineNancy informed that Dr. Randolm IdolFletke does not have any acces to the loop tracking device and that she would have to contact cardiology.

## 2015-07-21 ENCOUNTER — Telehealth: Payer: Self-pay | Admitting: Family Medicine

## 2015-07-21 LAB — CULTURE, BLOOD (ROUTINE X 2)
CULTURE: NO GROWTH
Culture: NO GROWTH

## 2015-07-21 NOTE — Telephone Encounter (Signed)
Ann called and would like to speak to Dr. Randolm IdolFletke about her mother. She has went to the bathroom all day and she is drinking a lot of fluids.jw

## 2015-07-21 NOTE — Telephone Encounter (Signed)
Ann calling once more. Victoria Lewis, ASA

## 2015-07-24 NOTE — Telephone Encounter (Signed)
RN staff - please contact patient/daughter and address concerns. Thank you.

## 2015-07-24 NOTE — Telephone Encounter (Signed)
Spoke with Dewayne HatchAnn, she states that last week patient wasn't having much urine output but this has since resolved. Informed daughter that MyChart would be the best way to reach MD directly in the future. Daughter expressed understanding.

## 2015-07-26 ENCOUNTER — Telehealth: Payer: Self-pay | Admitting: *Deleted

## 2015-07-26 NOTE — Telephone Encounter (Signed)
Patient's daughter called requesting that I review the patient's Carelink transmissions for any episodes from last night, 07/25/15.  She states that her mother had another "spell where she passed out".  Advised that it would be best if she sends a manual transmission for review.  Patient's daughter voices understanding and states that she will send a transmission this afternoon when she gets home.  Advised that I will review transmission and call her back either this afternoon or tomorrow morning.  Patient's daughter appreciative and denies additional questions at this time.

## 2015-07-28 ENCOUNTER — Encounter: Payer: Self-pay | Admitting: Family Medicine

## 2015-07-28 ENCOUNTER — Ambulatory Visit (INDEPENDENT_AMBULATORY_CARE_PROVIDER_SITE_OTHER): Payer: Medicare Other | Admitting: Family Medicine

## 2015-07-28 VITALS — BP 135/76 | HR 98 | Temp 98.8°F | Ht 62.0 in

## 2015-07-28 DIAGNOSIS — I10 Essential (primary) hypertension: Secondary | ICD-10-CM | POA: Diagnosis not present

## 2015-07-28 DIAGNOSIS — M79601 Pain in right arm: Secondary | ICD-10-CM

## 2015-07-28 NOTE — Patient Instructions (Signed)
It was nice to see you today.  Blood pressure - well controlled, continue Amlodipine 2.5 mg daily  Right arm pain - may be due to contractures (related to stroke, muscle stiffness), attempt trial of heat to affected areas and range of motion exercises at least once per day.  Return in one month for regular follow up.

## 2015-07-28 NOTE — Assessment & Plan Note (Signed)
Intermittent right arm pain. Unable to locate exact location. No tenderness/pain on exam today. Mild contracture of right elbow noted (able to extend to only 135 degrees). -continue daily scheduled Tylenol -heat to affected areas as needed -continue ROM exercises with home aide

## 2015-07-28 NOTE — Telephone Encounter (Signed)
Late entry from 07/26/15:  Patient's daughter called back to let us know that she had successfully sent a manual transmission.  Reviewed manual transmission, no new events since last office visit with Dr. Graciela HusbandsKlein.  Patient's daughter aware and appreciative.  She denies any additional questions at this time.

## 2015-07-28 NOTE — Progress Notes (Signed)
   Subjective:    Patient ID: Victoria Lewis, female    DOB: 02-21-1928, 81 y.o.   MRN: [id number]  HPI 81 y/o female presents for routine follow up. Patient unable to provide history due to Dementia. Accompanied by husband and son.  HTN - Amlodipine 2.5 mg restarted at last visit, no side effects noted  Right arm pain - intermittent, patient unable to specify location exactly, husband notes decreased movement and pain at times, on Tylenol scheduled daily, getting daily ROM exercised from aide.    Review of Systems  Constitutional: Negative for fever, chills and fatigue.  Respiratory: Negative for cough.   Gastrointestinal: Negative for nausea, vomiting and diarrhea.       Objective:   Physical Exam Vitals: reviewed Gen: pleasantly demented female, NAD, sitting in wheelchair Cardiac: RRR, S1 and S2 present, no murmur Resp: CTAB, normal effort MSK: no joint swelling/erythema of right elbow/wrist/shoulder, no acute tenderness, decreased ROM of shoulder and elbow (suspect mild contracture of right elbow). Patient unable to perform rotator cuff testing.      Assessment & Plan:  Essential hypertension Controlled -continue Amlodipine 2.5 mg daily  Right arm pain Intermittent right arm pain. Unable to locate exact location. No tenderness/pain on exam today. Mild contracture of right elbow noted (able to extend to only 135 degrees). -continue daily scheduled Tylenol -heat to affected areas as needed -continue ROM exercises with home aide

## 2015-07-28 NOTE — Assessment & Plan Note (Signed)
Controlled -continue Amlodipine 2.5 mg daily

## 2015-07-31 ENCOUNTER — Ambulatory Visit (INDEPENDENT_AMBULATORY_CARE_PROVIDER_SITE_OTHER): Payer: Medicare Other | Admitting: *Deleted

## 2015-07-31 DIAGNOSIS — R55 Syncope and collapse: Secondary | ICD-10-CM

## 2015-08-01 NOTE — Progress Notes (Signed)
LOOP RECORDER  

## 2015-08-02 ENCOUNTER — Encounter: Payer: Self-pay | Admitting: Student

## 2015-08-02 ENCOUNTER — Ambulatory Visit (INDEPENDENT_AMBULATORY_CARE_PROVIDER_SITE_OTHER): Payer: Medicare Other | Admitting: Student

## 2015-08-02 VITALS — BP 124/84 | HR 64 | Temp 98.1°F | Wt 127.0 lb

## 2015-08-02 DIAGNOSIS — N390 Urinary tract infection, site not specified: Secondary | ICD-10-CM

## 2015-08-02 LAB — POCT URINALYSIS DIPSTICK
Bilirubin, UA: NEGATIVE
GLUCOSE UA: NEGATIVE
KETONES UA: NEGATIVE
Nitrite, UA: POSITIVE
Protein, UA: NEGATIVE
SPEC GRAV UA: 1.01
Urobilinogen, UA: 0.2
pH, UA: 6

## 2015-08-02 LAB — POCT UA - MICROSCOPIC ONLY

## 2015-08-02 NOTE — Progress Notes (Signed)
Subjective:    Patient ID: Victoria Lewis, female    DOB: [birth year], 81 y.o.   MRN: [id number]   CC: Concern by family that she is not feeling well andmay have a UTI   History largely provided by Pt's son, and husband  HPI 49 y/o presenting for concern by daughter and son that she is not feeling well and may have a UTI  Not feeling well - Daughter noted she was more quiet than yesterday and help leaning her head on her hand - Patient did not have fever, vomiting, nor did not complain of abdominal pain or arm pain.  - she was recently seen by her PCP for right arm pain which her husband, how is here today, does not feel is contributing to her current presentation - Because she seems to be feeling unwell family is worried she may have a UTI - Today Pt denies any issues including arm pain, abdominal pain, dysuria and states she " feels fine"  Review of Systems   See HPI for ROS.   Past Medical History  Diagnosis Date  . Hypertension   . GERD (gastroesophageal reflux disease)   . H/O hiatal hernia   . Arthritis   . Syncope 07/13/2014  . Bladder infection, chronic     [name] 08/30/2014  . AV block, complete (HCC)   . Chest pain   . Decreased dorsalis pedis pulse   . Hammer toe   . Hyperlipidemia   . Onychogryposis of toenail   . Osteoporosis   . Scoliosis   . Status post placement of implantable loop recorder   . Syncope   . Uterine disorder   . Diaphragmatic hernia   . Hx of epistaxis   . Chest pain   . Iron deficiency anemia   . Chronic back pain   . Dementia   . Chronic kidney disease (CKD), stage III (moderate)   . Recurrent UTI (urinary tract infection)   . UTI (urinary tract infection) 04/2015   Past Surgical History  Procedure Laterality Date  . Joint replacement    . Cataract extraction, bilateral Bilateral   . Total hip arthroplasty Right 10/11/2013    Procedure: TOTAL HIP ARTHROPLASTY;  Surgeon: Nestor Lewandowsky, MD;  Location: [facility] OR;  Service:  Orthopedics;  Laterality: Right;  . Loop recorder implant    . Tubal ligation    . Total hip arthroplasty Left 05/2006    [name] 01/22/2011   OB History    No data available     Social History   Social History  . Marital Status: Married    Spouse Name: Acupuncturist  . Number of Children: 4  . Years of Education: College   Occupational History  . Retired    Social History Main Topics  . Smoking status: Never Smoker   . Smokeless tobacco: Never Used  . Alcohol Use: No  . Drug Use: No  . Sexual Activity: Not on file   Other Topics Concern  . Not on file   Social History Narrative   Lives with her husband [place].    Caffeine use: rare    Objective:  BP 124/84 mmHg  Pulse 64  Temp(Src) 98.1 F (36.7 C) (Oral)  Wt 127 lb (57.607 kg) Vitals and nursing note reviewed   General: NAD, pleasant and well appearing Cardiac: RRR,  Respiratory: CTAB, normal effort Abdomen: soft, nontender, Extremities: right ankle in brace, no LE tenderness or swelling Skin: warm and dry, no rashes noted  Neuro: alert and oriented, answers questions appropriately   Assessment & Plan:   Family concern for UTI - No evidence of infection today on exam. She appears well with normal vital signs - However, given family concern for UTI will collect a urine culture - She likely has chronic colonization of her urine, so if her culture is positive, would first determine if she develops symptoms prior to treating   Azie Mcconahy A. Kennon RoundsHaney MD, MS Family Medicine Resident PGY-2 Pager [phone]

## 2015-08-02 NOTE — Patient Instructions (Signed)
Follow up with Dr Randolm IdolFletke in 1 month If she has fevers, concern for worsening status, call the office right away

## 2015-08-05 LAB — URINE CULTURE: Colony Count: 80000

## 2015-08-06 ENCOUNTER — Telehealth: Payer: Self-pay | Admitting: Family Medicine

## 2015-08-06 NOTE — Telephone Encounter (Addendum)
[facility] x 2 today, most recent approx 1400. Discussed patient, Victoria Lewis, with daughter Alger Simonsnne Bauman. Chart review shows recent [facility]" possible UTI per family report, at that time without reported clinical history of UTI, but known prior recurrent UTIs. She had urine culture done, which showed E. Coli and Klebsiella at 80k CFU. Her daughter is requesting culture results initially. I reviewed this results with her, and stated this is below the normal level that we would treat for a UTI, and especially given report that per her PCP she has discussed trying to avoid unnecessary antibiotic use. Per report, patient has been doing "much better" since this office visit, improved hydration with water and gatorade, voiding well without dysuria, no urinary odor. Does admit recent episode 2 nights ago with possible syncope with large void on toilet, EMS was called and they evaluated her but did not bring her to hospital. No further questions at this time.  Note daughter had paged back 2nd time, to ask specifically about her mother having a long nap and seemed sleepy after eating lunch, had a "pie that was sugary", and she seemed to be "out" for a while. They checked her CBG at 125. Normally runs "higher". No other focal symptoms reported. I reviewed her upcoming apt, next is 1 mo follow-up with PCP Dr Randolm IdolFletke on 12/16. Per daughter request, would like to be seen earlier, I moved apt to 08/18/15, confirmed with daughter. Additionally, she will call this week to make a more urgent follow-up if not improving or again concerns about her potential UTI.  Saralyn PilarAlexander Grantley Savage, DO [facility], PGY-3

## 2015-08-16 ENCOUNTER — Ambulatory Visit (INDEPENDENT_AMBULATORY_CARE_PROVIDER_SITE_OTHER): Payer: Medicare Other | Admitting: Family Medicine

## 2015-08-16 VITALS — BP 130/90 | HR 93 | Temp 98.3°F

## 2015-08-16 DIAGNOSIS — N390 Urinary tract infection, site not specified: Secondary | ICD-10-CM

## 2015-08-16 MED ORDER — CEPHALEXIN 500 MG PO CAPS: 500.0000 mg | ORAL_CAPSULE | Freq: Two times a day (BID) | ORAL | Status: DC

## 2015-08-16 NOTE — Assessment & Plan Note (Signed)
Patient seen in office at request of nursing staff for UTI symptoms. -recent urine culture on 11/23 showed 80,000 Ecoli -discussed repeat cath urine vs empiric treatment with family -decided to treat with Keflex 500 mg BID for 7 days -has return follow up scheduled in 2 days

## 2015-08-16 NOTE — Progress Notes (Signed)
   Patient was brought into nurse clinic today by her son and daughter.  They stated patient was not feeling well.  They stated they she might have an UTI again.  Denies fever, but strong odor of urine. Patient did state she was hurting in her back and kidneys.  Precept with Dr. Randolm IdolFletke, who is patient's PCP.  Will forward Dr. Randolm IdolFletke.  [name], Tamika L, RN

## 2015-08-16 NOTE — Progress Notes (Signed)
   Subjective:    Patient ID: Victoria HelperBetty C Lewis, female    DOB: 03-19-1928, 81 y.o.   MRN: [id number]  HPI 81 y/o female presents for nursing visit. Accompanied by son and daughter.  One day history of decreased PO intake, increased fatigue, patient unable to provide much history due to dementia however complains of back and "kidney pain".   Review of Systems No fevers/chills    Objective:   Physical Exam Vitals: reviewed Gen: pleasant female, frail appearing, in wheelchair Cardiac: RRR, S1 and S2 present, no murmur Resp: CTAB, normal effort Abd: soft, no tenderness, normal bowel sounds  Urine culture from 11/23 grew 80,000 Ecoli sensitive to multiple antibiotics     Assessment & Plan:  Recurrent UTI Patient seen in office at request of nursing staff for UTI symptoms. -recent urine culture on 11/23 showed 80,000 Ecoli -discussed repeat cath urine vs empiric treatment with family -decided to treat with Keflex 500 mg BID for 7 days -has return follow up scheduled in 2 days

## 2015-08-18 ENCOUNTER — Ambulatory Visit (INDEPENDENT_AMBULATORY_CARE_PROVIDER_SITE_OTHER): Payer: Medicare Other | Admitting: Family Medicine

## 2015-08-18 ENCOUNTER — Encounter: Payer: Self-pay | Admitting: Family Medicine

## 2015-08-18 VITALS — BP 146/79 | HR 98 | Temp 98.2°F

## 2015-08-18 DIAGNOSIS — N390 Urinary tract infection, site not specified: Secondary | ICD-10-CM

## 2015-08-18 DIAGNOSIS — R52 Pain, unspecified: Secondary | ICD-10-CM

## 2015-08-18 MED ORDER — GABAPENTIN 100 MG PO CAPS: 200.0000 mg | ORAL_CAPSULE | Freq: Three times a day (TID) | ORAL | Status: DC

## 2015-08-18 NOTE — Assessment & Plan Note (Signed)
Patient continues to have intermittent aches/pains likely from OA.  -continue Tylenol daily -increase Gabapentin to 200 mg TID.

## 2015-08-18 NOTE — Assessment & Plan Note (Addendum)
Follow up from UTI on 08/16/15. Patient's symptoms continue to fluctuate. Tolerating PO fluids. Shared decision making to avoid hospitalization if possible.  -continue Keflex  -continue fluid intake (at least 72 oz per day). -Discussed return precautions.

## 2015-08-18 NOTE — Patient Instructions (Signed)
It was nice to see you today.  Please complete the course of Keflex  Continue to push fluids with goal of at least 72 oz per day.   Please return next week if she continues to not feel well.

## 2015-08-18 NOTE — Progress Notes (Signed)
   Subjective:    Patient ID: Victoria Lewis, female    DOB: 1928/08/05, 81 y.o.   MRN: [id number]  HPI 81 y/o female presents for follow up of presumed UTI. Husband and son provide history.   UTI - seen and evaluated on 12/9, started empirically on keflex for suspected UTI (based on urine from late November, 80,000 colonies of [place] Borg[place]coli). Patient had a good day yesterday, somewhat more fatigued today, decreased PO, tolerated fluids, no fevers chills, no nausea, no emesis, no diarrhea/constipation.   PO intake - typical day 18 oz cranberry concentrate + water; 32 oz water; 16 oz of Gatorade; 2-3 cups of coffee  Chronic pain - taking Tylenol 500 mg QID, son would like to know if we can increase Gabapentin (currently 100 mg TID) as this seems to make her feel better.    Review of Systems  Constitutional: Negative for fever and chills.  Respiratory: Negative for shortness of breath.   Cardiovascular: Negative for chest pain.  Gastrointestinal: Negative for nausea, vomiting, diarrhea and constipation.       Objective:   Physical Exam Vitals: reviewed Gen: pleasant female, sitting in wheelchair, frail appearing Cardiac: RRR, S1 and S2 present, no murmur, no heaves/thrills Resp: CTAB, normal effort Abd: soft,no tenderness, normal bowel sounds     Assessment & Plan:  Recurrent UTI Follow up from UTI on 08/16/15. Patient's symptoms continue to fluctuate. Tolerating PO fluids. Shared decision making to avoid hospitalization if possible.  -continue Keflex  -continue fluid intake (at least 72 oz per day). -Discussed return precautions.   Pain, intermittent Patient continues to have intermittent aches/pains likely from OA.  -continue Tylenol daily -increase Gabapentin to 200 mg TID.

## 2015-08-21 ENCOUNTER — Telehealth: Payer: Self-pay | Admitting: Family Medicine

## 2015-08-21 NOTE — Telephone Encounter (Signed)
Son called because the new prescription for Gabapentin the pharmacy has a issue with it. Please call and straighten out. jw

## 2015-08-21 NOTE — Telephone Encounter (Signed)
Spoke with pharmacy and dr Randolm Idolfletke spoke to patients son. Victoria Lewis, CMA

## 2015-08-25 ENCOUNTER — Ambulatory Visit: Payer: Medicare Other | Admitting: Family Medicine

## 2015-08-29 ENCOUNTER — Ambulatory Visit: Payer: Medicare Other | Admitting: Family Medicine

## 2015-08-30 ENCOUNTER — Ambulatory Visit (INDEPENDENT_AMBULATORY_CARE_PROVIDER_SITE_OTHER): Payer: Medicare Other | Admitting: *Deleted

## 2015-08-30 DIAGNOSIS — R55 Syncope and collapse: Secondary | ICD-10-CM | POA: Diagnosis not present

## 2015-08-30 NOTE — Progress Notes (Signed)
Carelink Summary Report / Loop Recorder 

## 2015-09-02 ENCOUNTER — Emergency Department (HOSPITAL_COMMUNITY)
Admission: EM | Admit: 2015-09-02 | Discharge: 2015-09-02 | Disposition: A | Discharge status: Home / Self Care | Payer: Medicare Other | Source: Home / Self Care | Attending: Emergency Medicine | Admitting: Emergency Medicine

## 2015-09-02 ENCOUNTER — Encounter (HOSPITAL_COMMUNITY): Payer: Self-pay | Admitting: *Deleted

## 2015-09-02 DIAGNOSIS — I129 Hypertensive chronic kidney disease with stage 1 through stage 4 chronic kidney disease, or unspecified chronic kidney disease: Secondary | ICD-10-CM | POA: Insufficient documentation

## 2015-09-02 DIAGNOSIS — N390 Urinary tract infection, site not specified: Secondary | ICD-10-CM | POA: Diagnosis not present

## 2015-09-02 DIAGNOSIS — F0390 Unspecified dementia without behavioral disturbance: Secondary | ICD-10-CM | POA: Insufficient documentation

## 2015-09-02 DIAGNOSIS — N183 Chronic kidney disease, stage 3 (moderate): Secondary | ICD-10-CM | POA: Insufficient documentation

## 2015-09-02 DIAGNOSIS — M1990 Unspecified osteoarthritis, unspecified site: Secondary | ICD-10-CM

## 2015-09-02 DIAGNOSIS — K219 Gastro-esophageal reflux disease without esophagitis: Secondary | ICD-10-CM

## 2015-09-02 DIAGNOSIS — Z95818 Presence of other cardiac implants and grafts: Secondary | ICD-10-CM | POA: Insufficient documentation

## 2015-09-02 DIAGNOSIS — Z872 Personal history of diseases of the skin and subcutaneous tissue: Secondary | ICD-10-CM

## 2015-09-02 DIAGNOSIS — G8929 Other chronic pain: Secondary | ICD-10-CM | POA: Insufficient documentation

## 2015-09-02 DIAGNOSIS — Z87440 Personal history of urinary (tract) infections: Secondary | ICD-10-CM | POA: Insufficient documentation

## 2015-09-02 DIAGNOSIS — M419 Scoliosis, unspecified: Secondary | ICD-10-CM

## 2015-09-02 DIAGNOSIS — Z8639 Personal history of other endocrine, nutritional and metabolic disease: Secondary | ICD-10-CM

## 2015-09-02 DIAGNOSIS — R55 Syncope and collapse: Secondary | ICD-10-CM | POA: Diagnosis not present

## 2015-09-02 DIAGNOSIS — Z862 Personal history of diseases of the blood and blood-forming organs and certain disorders involving the immune mechanism: Secondary | ICD-10-CM | POA: Insufficient documentation

## 2015-09-02 DIAGNOSIS — Z79899 Other long term (current) drug therapy: Secondary | ICD-10-CM

## 2015-09-02 LAB — I-STAT CHEM 8, ED
BUN: 18 mg/dL (ref 6–20)
CHLORIDE: 104 mmol/L (ref 101–111)
Calcium, Ion: 1.25 mmol/L (ref 1.13–1.30)
Creatinine, Ser: 1 mg/dL (ref 0.44–1.00)
GLUCOSE: 81 mg/dL (ref 65–99)
HEMATOCRIT: 36 % (ref 36.0–46.0)
HEMOGLOBIN: 12.2 g/dL (ref 12.0–15.0)
POTASSIUM: 4.3 mmol/L (ref 3.5–5.1)
SODIUM: 140 mmol/L (ref 135–145)
TCO2: 27 mmol/L (ref 0–100)

## 2015-09-02 LAB — CBC WITH DIFFERENTIAL/PLATELET
BASOS ABS: 0.1 10*3/uL (ref 0.0–0.1)
BASOS PCT: 1 %
EOS ABS: 0.4 10*3/uL (ref 0.0–0.7)
Eosinophils Relative: 8 %
HCT: 32 % — ABNORMAL LOW (ref 36.0–46.0)
HEMOGLOBIN: 10 g/dL — AB (ref 12.0–15.0)
Lymphocytes Relative: 20 %
Lymphs Abs: 1.1 10*3/uL (ref 0.7–4.0)
MCH: 26.3 pg (ref 26.0–34.0)
MCHC: 31.3 g/dL (ref 30.0–36.0)
MCV: 84.2 fL (ref 78.0–100.0)
Monocytes Absolute: 0.4 10*3/uL (ref 0.1–1.0)
Monocytes Relative: 7 %
NEUTROS PCT: 64 %
Neutro Abs: 3.6 10*3/uL (ref 1.7–7.7)
Platelets: 366 10*3/uL (ref 150–400)
RBC: 3.8 MIL/uL — AB (ref 3.87–5.11)
RDW: 16.4 % — ABNORMAL HIGH (ref 11.5–15.5)
WBC: 5.5 10*3/uL (ref 4.0–10.5)

## 2015-09-02 LAB — URINALYSIS, ROUTINE W REFLEX MICROSCOPIC
Bilirubin Urine: NEGATIVE
Glucose, UA: NEGATIVE mg/dL
Ketones, ur: NEGATIVE mg/dL
NITRITE: POSITIVE — AB
PROTEIN: NEGATIVE mg/dL
Specific Gravity, Urine: 1.012 (ref 1.005–1.030)
pH: 6 (ref 5.0–8.0)

## 2015-09-02 LAB — URINE MICROSCOPIC-ADD ON

## 2015-09-02 LAB — BASIC METABOLIC PANEL
Anion gap: 8 (ref 5–15)
BUN: 14 mg/dL (ref 6–20)
CHLORIDE: 106 mmol/L (ref 101–111)
CO2: 26 mmol/L (ref 22–32)
CREATININE: 0.99 mg/dL (ref 0.44–1.00)
Calcium: 9.3 mg/dL (ref 8.9–10.3)
GFR calc non Af Amer: 51 mL/min — ABNORMAL LOW (ref >60)
GFR, EST AFRICAN AMERICAN: 59 mL/min — AB (ref >60)
Glucose, Bld: 88 mg/dL (ref 65–99)
POTASSIUM: 4.5 mmol/L (ref 3.5–5.1)
SODIUM: 140 mmol/L (ref 135–145)

## 2015-09-02 LAB — I-STAT CG4 LACTIC ACID, ED: LACTIC ACID, VENOUS: 1.01 mmol/L (ref 0.5–2.0)

## 2015-09-02 MED ORDER — CEPHALEXIN 250 MG PO CAPS
500.0000 mg | ORAL_CAPSULE | Freq: Once | ORAL | Status: AC
  Administered 2015-09-02: 500 mg via ORAL
  Filled 2015-09-02: qty 2

## 2015-09-02 MED ORDER — ACETAMINOPHEN 500 MG PO TABS
500.0000 mg | ORAL_TABLET | Freq: Once | ORAL | Status: AC
  Administered 2015-09-02: 500 mg via ORAL
  Filled 2015-09-02: qty 1

## 2015-09-02 MED ORDER — SODIUM CHLORIDE 0.9 % IV BOLUS (SEPSIS)
1000.0000 mL | Freq: Once | INTRAVENOUS | Status: AC
  Administered 2015-09-02: 1000 mL via INTRAVENOUS

## 2015-09-02 MED ORDER — GABAPENTIN 100 MG PO CAPS
200.0000 mg | ORAL_CAPSULE | Freq: Once | ORAL | Status: AC
  Administered 2015-09-02: 200 mg via ORAL
  Filled 2015-09-02: qty 2

## 2015-09-02 MED ORDER — GABAPENTIN 100 MG PO CAPS: 200.0000 mg | ORAL_CAPSULE | Freq: Three times a day (TID) | ORAL | Status: DC

## 2015-09-02 MED ORDER — CEPHALEXIN 500 MG PO CAPS: 500.0000 mg | ORAL_CAPSULE | Freq: Two times a day (BID) | ORAL | Status: DC

## 2015-09-02 NOTE — ED Notes (Signed)
Pt in from home via PT[facility], pt A&O x3, pt disoriented time, hx of dementia, per PTAR this is baseline, pt reported to be lethargic with moving from the wheelchair to her living room, pt denies CP, SOB, & n/v/d

## 2015-09-02 NOTE — ED Provider Notes (Signed)
CSN: [id number]     Arrival date & time 09/02/15  9562 History   First MD Initiated Contact with Patient 09/02/15 [phone]     Chief Complaint  Patient presents with  . Urinary Tract Infection   Level V caveat due to dementia  HPI  [name] is an 81 year old female with PMHx of chronic UTIs, HTN, dementia, CKD3 and arthritis who presents with fatigue. Patient's daughter is at bedside and provides the history. She states that her mother has chronic UTIs and she feels that she has another one. The daughter reports increased fatigue and "just acting like she's got one again". She also notes that her mother's urine has a strong odor to it which is typical of her UTIs. Pt lives at home and is cared for by her husband and a home health aide. She states that while she was visiting this morning her mother seemed very tired and was not interacting like she typically does. She also notes that her mother has not been drinking as much water as usual over the past 2 days. She reports that she is followed by Dr. Randolm Idol for her chronic UTIs. She was most recently seen and treated on 12/9. She reports that her mother is at her baseline currently. Denies fevers, syncope, vomiting or diarrhea. [name] has no complaints.  Past Medical History  Diagnosis Date  . Hypertension   . GERD (gastroesophageal reflux disease)   . H/O hiatal hernia   . Arthritis   . Syncope 07/13/2014  . Bladder infection, chronic     [name] 08/30/2014  . AV block, complete (HCC)   . Chest pain   . Decreased dorsalis pedis pulse   . Hammer toe   . Hyperlipidemia   . Onychogryposis of toenail   . Osteoporosis   . Scoliosis   . Status post placement of implantable loop recorder   . Syncope   . Uterine disorder   . Diaphragmatic hernia   . Hx of epistaxis   . Chest pain   . Iron deficiency anemia   . Chronic back pain   . Dementia   . Chronic kidney disease (CKD), stage III (moderate)   . Recurrent UTI (urinary tract  infection)   . UTI (urinary tract infection) 04/2015   Past Surgical History  Procedure Laterality Date  . Joint replacement    . Cataract extraction, bilateral Bilateral   . Total hip arthroplasty Right 10/11/2013    Procedure: TOTAL HIP ARTHROPLASTY;  Surgeon: Nestor Lewandowsky, MD;  Location: [facility] OR;  Service: Orthopedics;  Laterality: Right;  . Loop recorder implant    . Tubal ligation    . Total hip arthroplasty Left 05/2006    [name] 01/22/2011   Family History  Problem Relation Age of Onset  . Stroke Mother 48    died of stroke  . Hypertension Mother   . Heart disease Mother    Social History  Substance Use Topics  . Smoking status: Never Smoker   . Smokeless tobacco: Never Used  . Alcohol Use: No   OB History    No data available     Review of Systems  Unable to perform ROS: Dementia  Constitutional: Positive for fatigue.      Allergies  Review of patient's allergies indicates no known allergies.  Home Medications   Prior to Admission medications   Medication Sig Start Date End Date Taking? Authorizing Provider  acetaminophen (TYLENOL) 500 MG tablet Take one tablet (500  mg) by mouth four times a day   Yes Historical Provider, MD  amLODipine (NORVASC) 2.5 MG tablet daily. Take one tablet (2.5 mg) by mouth at 10:00 am   Yes Historical Provider, MD  cholecalciferol (VITAMIN D) 1000 UNITS tablet Take 1,000 Units by mouth daily.   Yes Historical Provider, MD  CRANBERRY PO Take 240 mLs by mouth 3 (three) times daily. Cranberry concentrate solution   Yes Historical Provider, MD  D-MANNOSE PO Take 1 tablet by mouth 2 (two) times daily.   Yes Historical Provider, MD  fluticasone (FLONASE) 50 MCG/ACT nasal spray Place 2 sprays into both nostrils daily. Patient taking differently: Place 2 sprays into both nostrils daily as needed for allergies.  12/05/14  Yes Uvaldo Rising, MD  gabapentin (NEURONTIN) 100 MG capsule Take 2 capsules (200 mg total) by mouth 3 (three) times daily.  Take 1 capsule three times each day; may take 1 more if needed for breakthrough pain 08/18/15  Yes Uvaldo Rising, MD  nystatin (MYCOSTATIN/NYSTOP) 100000 UNIT/GM POWD Apply to affected area twice daily. Patient taking differently: Apply 1 g topically 2 (two) times daily as needed (Fungal infection).  02/28/15  Yes Uvaldo Rising, MD  omeprazole (PRILOSEC) 20 MG capsule Take one tablet (20 mg) by mouth every morning   Yes Historical Provider, MD  Probiotic Product (ALIGN) 4 MG CAPS Take 4 mg by mouth daily.   Yes Historical Provider, MD  Wheat Dextrin (BENEFIBER) POWD Take 5-10 mg by mouth See admin instructions. Take  daily, and if needed for constipation take another .   Yes Historical Provider, MD  cephALEXin (KEFLEX) 500 MG capsule Take 1 capsule (500 mg total) by mouth 2 (two) times daily. Patient not taking: Reported on 09/02/2015 08/16/15   Uvaldo Rising, MD  cephALEXin (KEFLEX) 500 MG capsule Take 1 capsule (500 mg total) by mouth 2 (two) times daily. 09/02/15   Jonmarc Bodkin, PA-C   BP 169/75 mmHg  Pulse 69  Temp(Src) 98.3 F (36.8 C) (Oral)  Resp 16  SpO2 98% Physical Exam  Constitutional: She appears well-developed and well-nourished. No distress.  Pleasantly demented  HENT:  Head: Normocephalic and atraumatic.  Mouth/Throat: Mucous membranes are dry.  Eyes: Conjunctivae are normal. Right eye exhibits no discharge. Left eye exhibits no discharge. No scleral icterus.  Neck: Normal range of motion.  Cardiovascular: Normal rate, regular rhythm and normal heart sounds.   Pulmonary/Chest: Effort normal and breath sounds normal. No respiratory distress.  Abdominal: Soft. She exhibits no distension. There is no tenderness. There is no rebound and no guarding.  Musculoskeletal: Normal range of motion.  Moves all extremities spontaneously  Neurological: She is alert. No cranial nerve deficit. Coordination normal.  Oriented to person and situation. Cranial nerves grossly intact. Pt  does not follow direction for strength/sensation testing but move all extremities with purpose and grossly normal strength  Skin: Skin is warm and dry.  Psychiatric: She has a normal mood and affect. Her behavior is normal.  Nursing note and vitals reviewed.   ED Course  Procedures (including critical care time) Labs Review Labs Reviewed  URINALYSIS, ROUTINE W REFLEX MICROSCOPIC (NOT AT [facility]) - Abnormal; Notable for the following:    APPearance CLOUDY (*)    Hgb urine dipstick SMALL (*)    Nitrite POSITIVE (*)    Leukocytes, UA LARGE (*)    All other components within normal limits  URINE MICROSCOPIC-ADD ON - Abnormal; Notable for the following:    Squamous  Epithelial / LPF 0-5 (*)    Bacteria, UA MANY (*)    All other components within normal limits  CBC WITH DIFFERENTIAL/PLATELET - Abnormal; Notable for the following:    RBC 3.80 (*)    Hemoglobin 10.0 (*)    HCT 32.0 (*)    RDW 16.4 (*)    All other components within normal limits  BASIC METABOLIC PANEL - Abnormal; Notable for the following:    GFR calc non Af Amer 51 (*)    GFR calc Af Amer 59 (*)    All other components within normal limits  I-STAT CHEM 8, ED  I-STAT CG4 LACTIC ACID, ED    Imaging Review No results found. I have personally reviewed and evaluated these images and lab results as part of my medical decision-making.   EKG Interpretation   Date/Time:  Saturday September 02 2015 09:29:29 EST Ventricular Rate:  80 PR Interval:  178 QRS Duration: 78 QT Interval:  380 QTC Calculation: 438 R Axis:   0 Text Interpretation:  Sinus rhythm Low voltage, precordial leads No  significant change since last tracing Confirmed by KNOTT MD, DANIEL  ([id number]) on 09/02/2015 9:32:46 AM      MDM   Final diagnoses:  UTI (lower urinary tract infection)   81 year old female with chronic UTIs presenting with symptoms of UTI. Pt's daughter provides history. Level 5 caveat due to dementia. Pt's daughter reports she is  fatigued and "just seems like she has a UTI". VSS. Pt is nontoxic appearing. Non-focal neuro exam. Abdomen is soft, non-tender. Pt's daughter reports she is at her baseline. No leukocytosis. Lactic acid 1.01. Urinalysis positive for nitrites, leukocytes and bacteria. Pt does not meet SIRS or sepsis criteria and is hemodynamically stable. Pt is appropriate for outpatient treatment. Given 1 L bolus in ED and daughter reports "she's even more like herself now". Will discharge with keflex and close follow up with her PCP. Discussed pt with Dr. Clydene PughKnott who agrees with plan. Return precautions given in discharge paperwork and discussed with pt at bedside. Pt stable for discharge     Alveta HeimlichStevi Jhovani Griswold, PA-C 09/02/15 1332  Lyndal Pulleyaniel Knott, MD 09/03/15 [phone]

## 2015-09-02 NOTE — Discharge Instructions (Signed)
Schedule a follow up appointment with Dr. Randolm IdolFletke   Urinary Tract Infection Urinary tract infections (UTIs) can develop anywhere along your urinary tract. Your urinary tract is your body's drainage system for removing wastes and extra water. Your urinary tract includes two kidneys, two ureters, a bladder, and a urethra. Your kidneys are a pair of bean-shaped organs. Each kidney is about the size of your fist. They are located below your ribs, one on each side of your spine. CAUSES Infections are caused by microbes, which are microscopic organisms, including fungi, viruses, and bacteria. These organisms are so small that they can only be seen through a microscope. Bacteria are the microbes that most commonly cause UTIs. SYMPTOMS  Symptoms of UTIs may vary by age and gender of the patient and by the location of the infection. Symptoms in young women typically include a frequent and intense urge to urinate and a painful, burning feeling in the bladder or urethra during urination. Older women and men are more likely to be tired, shaky, and weak and have muscle aches and abdominal pain. A fever may mean the infection is in your kidneys. Other symptoms of a kidney infection include pain in your back or sides below the ribs, nausea, and vomiting. DIAGNOSIS To diagnose a UTI, your caregiver will ask you about your symptoms. Your caregiver will also ask you to provide a urine sample. The urine sample will be tested for bacteria and white blood cells. White blood cells are made by your body to help fight infection. TREATMENT  Typically, UTIs can be treated with medication. Because most UTIs are caused by a bacterial infection, they usually can be treated with the use of antibiotics. The choice of antibiotic and length of treatment depend on your symptoms and the type of bacteria causing your infection. HOME CARE INSTRUCTIONS  If you were prescribed antibiotics, take them exactly as your caregiver instructs you.  Finish the medication even if you feel better after you have only taken some of the medication.  Drink enough water and fluids to keep your urine clear or pale yellow.  Avoid caffeine, tea, and carbonated beverages. They tend to irritate your bladder.  Empty your bladder often. Avoid holding urine for long periods of time.  Empty your bladder before and after sexual intercourse.  After a bowel movement, women should cleanse from front to back. Use each tissue only once. SEEK MEDICAL CARE IF:   You have back pain.  You develop a fever.  Your symptoms do not begin to resolve within 3 days. SEEK IMMEDIATE MEDICAL CARE IF:   You have severe back pain or lower abdominal pain.  You develop chills.  You have nausea or vomiting.  You have continued burning or discomfort with urination. MAKE SURE YOU:   Understand these instructions.  Will watch your condition.  Will get help right away if you are not doing well or get worse.   This information is not intended to replace advice given to you by your health care provider. Make sure you discuss any questions you have with your health care provider.   Document Released: 06/05/2005 Document Revised: 05/17/2015 Document Reviewed: 10/04/2011 Elsevier Interactive Patient Education [employer]2016 Elsevier Inc.

## 2015-09-04 ENCOUNTER — Encounter (HOSPITAL_COMMUNITY): Payer: Self-pay | Admitting: *Deleted

## 2015-09-04 ENCOUNTER — Inpatient Hospital Stay (HOSPITAL_COMMUNITY)
Admission: EM | Admit: 2015-09-04 | Discharge: 2015-09-07 | DRG: 690 | Disposition: A | Discharge status: Home / Self Care | Payer: Medicare Other | Attending: Family Medicine | Admitting: Family Medicine

## 2015-09-04 DIAGNOSIS — G8929 Other chronic pain: Secondary | ICD-10-CM | POA: Diagnosis present

## 2015-09-04 DIAGNOSIS — M545 Low back pain: Secondary | ICD-10-CM | POA: Diagnosis present

## 2015-09-04 DIAGNOSIS — F05 Delirium due to known physiological condition: Secondary | ICD-10-CM | POA: Diagnosis not present

## 2015-09-04 DIAGNOSIS — E785 Hyperlipidemia, unspecified: Secondary | ICD-10-CM | POA: Diagnosis present

## 2015-09-04 DIAGNOSIS — E872 Acidosis, unspecified: Secondary | ICD-10-CM | POA: Diagnosis present

## 2015-09-04 DIAGNOSIS — N390 Urinary tract infection, site not specified: Principal | ICD-10-CM | POA: Diagnosis present

## 2015-09-04 DIAGNOSIS — M810 Age-related osteoporosis without current pathological fracture: Secondary | ICD-10-CM | POA: Diagnosis present

## 2015-09-04 DIAGNOSIS — F0391 Unspecified dementia with behavioral disturbance: Secondary | ICD-10-CM | POA: Diagnosis not present

## 2015-09-04 DIAGNOSIS — F0390 Unspecified dementia without behavioral disturbance: Secondary | ICD-10-CM | POA: Diagnosis present

## 2015-09-04 DIAGNOSIS — K219 Gastro-esophageal reflux disease without esophagitis: Secondary | ICD-10-CM | POA: Diagnosis present

## 2015-09-04 DIAGNOSIS — F03918 Unspecified dementia, unspecified severity, with other behavioral disturbance: Secondary | ICD-10-CM | POA: Insufficient documentation

## 2015-09-04 DIAGNOSIS — Z96641 Presence of right artificial hip joint: Secondary | ICD-10-CM | POA: Diagnosis present

## 2015-09-04 DIAGNOSIS — M419 Scoliosis, unspecified: Secondary | ICD-10-CM | POA: Diagnosis present

## 2015-09-04 DIAGNOSIS — N179 Acute kidney failure, unspecified: Secondary | ICD-10-CM | POA: Diagnosis present

## 2015-09-04 DIAGNOSIS — K5900 Constipation, unspecified: Secondary | ICD-10-CM | POA: Diagnosis present

## 2015-09-04 DIAGNOSIS — I442 Atrioventricular block, complete: Secondary | ICD-10-CM | POA: Diagnosis present

## 2015-09-04 DIAGNOSIS — E860 Dehydration: Secondary | ICD-10-CM | POA: Diagnosis present

## 2015-09-04 DIAGNOSIS — I129 Hypertensive chronic kidney disease with stage 1 through stage 4 chronic kidney disease, or unspecified chronic kidney disease: Secondary | ICD-10-CM | POA: Diagnosis present

## 2015-09-04 DIAGNOSIS — R55 Syncope and collapse: Secondary | ICD-10-CM | POA: Diagnosis present

## 2015-09-04 DIAGNOSIS — R4182 Altered mental status, unspecified: Secondary | ICD-10-CM

## 2015-09-04 DIAGNOSIS — Z8673 Personal history of transient ischemic attack (TIA), and cerebral infarction without residual deficits: Secondary | ICD-10-CM | POA: Diagnosis not present

## 2015-09-04 DIAGNOSIS — N183 Chronic kidney disease, stage 3 (moderate): Secondary | ICD-10-CM | POA: Diagnosis present

## 2015-09-04 LAB — URINE MICROSCOPIC-ADD ON

## 2015-09-04 LAB — I-STAT CG4 LACTIC ACID, ED
LACTIC ACID, VENOUS: 3.68 mmol/L — AB (ref 0.5–2.0)
LACTIC ACID, VENOUS: 1.83 mmol/L (ref 0.5–2.0)

## 2015-09-04 LAB — BASIC METABOLIC PANEL
Anion gap: 16 — ABNORMAL HIGH (ref 5–15)
BUN: 13 mg/dL (ref 6–20)
CALCIUM: 9.2 mg/dL (ref 8.9–10.3)
CHLORIDE: 107 mmol/L (ref 101–111)
CO2: 14 mmol/L — ABNORMAL LOW (ref 22–32)
CREATININE: 1.12 mg/dL — AB (ref 0.44–1.00)
GFR calc Af Amer: 50 mL/min — ABNORMAL LOW (ref >60)
GFR calc non Af Amer: 44 mL/min — ABNORMAL LOW (ref >60)
Glucose, Bld: 132 mg/dL — ABNORMAL HIGH (ref 65–99)
Potassium: 4.1 mmol/L (ref 3.5–5.1)
SODIUM: 137 mmol/L (ref 135–145)

## 2015-09-04 LAB — CBC WITH DIFFERENTIAL/PLATELET
BASOS ABS: 0.1 10*3/uL (ref 0.0–0.1)
Basophils Relative: 1 %
EOS PCT: 7 %
Eosinophils Absolute: 0.5 10*3/uL (ref 0.0–0.7)
HCT: 33.7 % — ABNORMAL LOW (ref 36.0–46.0)
Hemoglobin: 10.6 g/dL — ABNORMAL LOW (ref 12.0–15.0)
LYMPHS ABS: 1 10*3/uL (ref 0.7–4.0)
LYMPHS PCT: 15 %
MCH: 26.4 pg (ref 26.0–34.0)
MCHC: 31.5 g/dL (ref 30.0–36.0)
MCV: 84 fL (ref 78.0–100.0)
Monocytes Absolute: 0.5 10*3/uL (ref 0.1–1.0)
Monocytes Relative: 8 %
NEUTROS ABS: 4.3 10*3/uL (ref 1.7–7.7)
Neutrophils Relative %: 69 %
PLATELETS: 321 10*3/uL (ref 150–400)
RBC: 4.01 MIL/uL (ref 3.87–5.11)
RDW: 16.4 % — AB (ref 11.5–15.5)
WBC: 6.3 10*3/uL (ref 4.0–10.5)

## 2015-09-04 LAB — URINALYSIS, ROUTINE W REFLEX MICROSCOPIC
BILIRUBIN URINE: NEGATIVE
Glucose, UA: NEGATIVE mg/dL
Ketones, ur: NEGATIVE mg/dL
Nitrite: NEGATIVE
Protein, ur: NEGATIVE mg/dL
SPECIFIC GRAVITY, URINE: 1.008 (ref 1.005–1.030)
pH: 6 (ref 5.0–8.0)

## 2015-09-04 MED ORDER — SODIUM CHLORIDE 0.9 % IV BOLUS (SEPSIS)
500.0000 mL | Freq: Once | INTRAVENOUS | Status: AC
  Administered 2015-09-04: 500 mL via INTRAVENOUS

## 2015-09-04 MED ORDER — SODIUM CHLORIDE 0.9 % IV BOLUS (SEPSIS)
1000.0000 mL | Freq: Once | INTRAVENOUS | Status: AC
  Administered 2015-09-05: 1000 mL via INTRAVENOUS

## 2015-09-04 MED ORDER — DEXTROSE 5 % IV SOLN
1.0000 g | Freq: Once | INTRAVENOUS | Status: AC
  Administered 2015-09-04: 1 g via INTRAVENOUS
  Filled 2015-09-04: qty 10

## 2015-09-04 NOTE — ED Notes (Signed)
Report attempted 

## 2015-09-04 NOTE — ED Notes (Signed)
EMS-patient from home, had syncopal episode while using restroom, witnessed by family with no fall. Patient has hx of syncopal episodes while using restroom. Hx of uti's. Family states after syncopal episode patient was not back to her norm, patient has not been talking much since last Saturday where she was seen in the ED for UTI. EMS reports patient has become more responsive en route. 20g(L)hand. CBG 186. Vitals wnl.

## 2015-09-04 NOTE — H&P (Addendum)
Family Medicine [facility] Admission History and Physical Service Pager: [phone]  [name] Medical record number: [id number] Date of birth: 16-Jan-1928 Age: 81 y.o. Gender: female  Primary Care Provider: Uvaldo RisingFLETKE, KYLE, J, MD Consultants: None  Code Status: FULL  Chief Complaint: UTI   Assessment and Plan: Victoria Lewis is a 81 y.o. female presenting with UTI and syncopal event . PMH is significant for recurrent UTI, Complete AV Block, HTN, CKD, h/o CVA, h/o DVT, HLD, h/o recurrent syncope and scoliosis.   Recurrent UTI: Recently seen in ED on 12/24 for UTI and sent home with Rx for Keflex. ED did not obtain urine culture from that visit. Today presenting with worsening of her mental status. UA with large leukocytes, many bacteria, and too numerous to count WBC. Patient afebrile, without leukocytosis but with lactic acid elevated to 3.68 (was WNL on 12/24). Qsofa=1. Urine culture from 11/23 grew E coli and Klebsiella. Klebsiella was not sensitive to Keflex. In Oct 2016, patient had enterococcus UTI which was resistant to all antibiotics except for vancomycin.  -admit to telemetry; vitals per unit  -continue Rocephin for now, consider switching to Unasyn or Zosyn if not improving  -trend lactic acid  -1 L NS bolus ordered, s/p 500mL in ED  -f/u urine culture  -CBC in AM   Syncopal Episode: Likely  vasovagal given temporal association with restroom use, and past episodes of syncope related to toileting. Family did report that this episode was different with her having some fluid coming out of mouth/nose after episode. Can't rule out arrhythmia. However, she has loop recorder implanted which could be interrogated in the morning. Orthostatic hypotension is also a possibility given ongoing UTI. However, she was hemodynamically stable on arrival to ED. Patient with previous history of heart block. EKG in ED with artifact in every lead and difficult to  interpret.  -will touch base with her cardiologist for loop interrogation -order TSH (2.9 in 2015)  - telemetry -repeat EKG in AM  -consider neurology consult for history concerning for ? Seizure causing syncopal episode   AKI on CKD-3: Cr 1.12. B/l 0.8-1.0. Likely 2/2 to dehydration from recent poor PO intake. S/p 500 cc bolus.  -repeat BMET in AM  -1 L NS bolus ordered then MIVFs at 100 cc/hr   Hypertension: BPs elevated to 160s-170s/80s at admission. -Will resume home amlodipine 2.5 mg; consider increasing if further BP control needed  -Monitor BP closely  Constipation: Patient reports chronic constipation over the past few months in light of antibiotic use. Often use suppositories at home.  -dulcolax suppository daily prn  -Miralax daily   Scoliosis: on scheduled tylenol and gabapentin at home. -will resume home meds.  Hyperlipidemia: not on any treatment at home. Lipid panel in 12/2014: CH 291, TG 178, HDL 44 LDL 211 -Unclear the benefit to risk ratio in her age.  History of heart block with BB: in 07/2014. EKG in ED with severe artifact in all leads.  -repeat EKG in AM  - telemetry  History of CVA/PFO/DVT: in 12/2014. Treated with Eliquis in the past. Doesn't appear to be on any meds now.  FEN/GI: Heart Healthy Diet; Protonix; MIVF @ 100 cc/hr  Prophylaxis: Lovenox   Disposition: Admit to Telemetry; FPTS; Attending Jennette KettleNeal   History of Present Illness:  Victoria Lewis is a 81 y.o. female presenting with UTI and syncopal event. Recently seen in the ED on 12/24, diagnosed with UTI and discharged with Keflex.  Around supper  this evening, started feeling sick and used suppository. Then passed out while straining to have bowel movement. Has had multiple episodes of syncope with vasovagal with toileting. Daughter reports episode was slightly different because when her mom came to she was having profuse secretions/fluid drainage from her mouth and nose and did not resume normal  behavior as quickly as usual.   Family reports that Ms. Boulter has been feeling bad on and off for 1 week. Has had decreased verbal communication for days, barely saying any words to family. This is different from her baseline. Daughter reports foul smelling urine. Has had decreased PO intake for 1 week. Family also reports that she has been more pale than usual and moves her head back and forth, which is typical behavior for her when ill. Has told family that she is not feeling well. Incontinent at baseline and wears depends.  In the ED, was given 500 mL NS bolus and Rocephin.   Review Of Systems: Per HPI with the following additions: No new cough, no rhinorrhea. Chronic constipation.  Otherwise the remainder of the systems were negative.  Patient Active Problem List   Diagnosis Date Noted  . Pain, intermittent 08/18/2015  . Right arm pain 07/28/2015  . UTI (urinary tract infection) 07/04/2015  . Right ankle instability 05/19/2015  . Asystole (HCC)   . Near syncope   . Abnormal urine odor 04/24/2015  . PFO (patent foramen ovale) 03/23/2015  . Cerebral infarction due to embolism of right middle cerebral artery (HCC) 03/23/2015  . Paradoxical embolism (HCC) 03/23/2015  . Enterococcus UTI 03/07/2015  . UTI (lower urinary tract infection) 03/07/2015  . Intertrigo 01/24/2015  . Skin lesion of face 01/24/2015  . DVT (deep venous thrombosis) (HCC) 12/27/2014  . Cough 12/27/2014  . Stroke with cerebral ischemia (HCC)   . HLD (hyperlipidemia)   . Essential hypertension   . Focal motor deficit 12/19/2014  . Low back pain radiating to left leg 11/01/2014  . Complete heart block (HCC) 07/14/2014  . Dementia   . Chronic kidney disease (CKD), stage III (moderate) 04/11/2014  . History of urinary anomaly 03/04/2014  . Recurrent UTI 02/28/2014    Past Medical History: Past Medical History  Diagnosis Date  . Hypertension   . GERD (gastroesophageal reflux disease)   . H/O hiatal hernia    . Arthritis   . Syncope 07/13/2014  . Bladder infection, chronic     [name] 08/30/2014  . AV block, complete (HCC)   . Chest pain   . Decreased dorsalis pedis pulse   . Hammer toe   . Hyperlipidemia   . Onychogryposis of toenail   . Osteoporosis   . Scoliosis   . Status post placement of implantable loop recorder   . Syncope   . Uterine disorder   . Diaphragmatic hernia   . Hx of epistaxis   . Chest pain   . Iron deficiency anemia   . Chronic back pain   . Dementia   . Chronic kidney disease (CKD), stage III (moderate)   . Recurrent UTI (urinary tract infection)   . UTI (urinary tract infection) 04/2015    Past Surgical History: Past Surgical History  Procedure Laterality Date  . Joint replacement    . Cataract extraction, bilateral Bilateral   . Total hip arthroplasty Right 10/11/2013    Procedure: TOTAL HIP ARTHROPLASTY;  Surgeon: Nestor Lewandowsky, MD;  Location: [facility] OR;  Service: Orthopedics;  Laterality: Right;  . Loop recorder implant    .  Tubal ligation    . Total hip arthroplasty Left 05/2006    [name] 01/22/2011    Social History: Social History  Substance Use Topics  . Smoking status: Never Smoker   . Smokeless tobacco: Never Used  . Alcohol Use: No   Additional social history: None  Please also refer to relevant sections of EMR.  Family History: Family History  Problem Relation Age of Onset  . Stroke Mother 36    died of stroke  . Hypertension Mother   . Heart disease Mother     Allergies and Medications: No Known Allergies No current facility-administered medications on file prior to encounter.   Current Outpatient Prescriptions on File Prior to Encounter  Medication Sig Dispense Refill  . acetaminophen (TYLENOL) 500 MG tablet Take 500 mg by mouth every 6 (six) hours as needed for mild pain. Take one tablet (500 mg) by mouth four times a day    . amLODipine (NORVASC) 2.5 MG tablet Take 2.5 mg by mouth daily. Take one tablet (2.5 mg) by mouth at  10:00 am    . cephALEXin (KEFLEX) 500 MG capsule Take 1 capsule (500 mg total) by mouth 2 (two) times daily. 14 capsule 0  . CRANBERRY PO Take 240 mLs by mouth 3 (three) times daily. Cranberry concentrate solution    . D-MANNOSE PO Take 1 tablet by mouth 2 (two) times daily.    . fluticasone (FLONASE) 50 MCG/ACT nasal spray Place 2 sprays into both nostrils daily. (Patient taking differently: Place 2 sprays into both nostrils daily as needed for allergies. ) 16 g 6  . gabapentin (NEURONTIN) 100 MG capsule Take 2 capsules (200 mg total) by mouth 3 (three) times daily. Take 1 capsule three times each day; may take 1 more if needed for breakthrough pain 180 capsule 2  . nystatin (MYCOSTATIN/NYSTOP) 100000 UNIT/GM POWD Apply to affected area twice daily. (Patient taking differently: Apply 1 g topically 2 (two) times daily as needed (Fungal infection). ) 60 g 1  . omeprazole (PRILOSEC) 20 MG capsule Take one tablet (20 mg) by mouth every morning    . Probiotic Product (ALIGN) 4 MG CAPS Take 4 mg by mouth daily.    . Wheat Dextrin (BENEFIBER) POWD Take 5-10 mg by mouth See admin instructions. Take  daily, and if needed for constipation take another .    . cephALEXin (KEFLEX) 500 MG capsule Take 1 capsule (500 mg total) by mouth 2 (two) times daily. (Patient not taking: Reported on 09/02/2015) 14 capsule 0    Objective: BP 177/86 mmHg  Pulse 89  Resp 19  SpO2 100% Exam: General: frail-appearing, lying in bed in NAD  Eyes: EOMI, sclera anicteric  ENTM: Mildly dry mucus membranes.  Neck: Full ROM.  Cardiovascular: RRR. No murmurs appreciated. Distal pulses intact.  Respiratory: CTAB. Normal WOB.  Abdomen: +BS, soft, NTND  MSK: No LE edema. Brace on R ankle.  Skin: Linear gash on lateral outside of left ankle covered with bandage. Appears clean without sign of infection.  Neuro: Not oriented to place or time. Non-verbal.    Labs and Imaging: CBC BMET   Recent Labs Lab 09/04/15 2017   WBC 6.3  HGB 10.6*  HCT 33.7*  PLT 321    Recent Labs Lab 09/04/15 2017  NA 137  K 4.1  CL 107  CO2 14*  BUN 13  CREATININE 1.12*  GLUCOSE 132*  CALCIUM 9.2     Lactic Acid 3.68   Arvilla Market,  DO 09/04/2015, 10:04 PM PGY-1, [facility] FPTS Intern pager: [phone], text pages welcome  FPTS Upper-Level Resident Addendum  I have independently interviewed and examined the patient. I have discussed the above with the original author and agree with their documentation. My edits for correction/addition/clarification are in pink. Please see also any attending notes.   Abram Sander, MD MPH PGY-3, [facility] FPTS Service pager: [phone] (text pages welcome through [facility])  Seen and examined.  Discussed with residents.  Agree with their documentation and management.  Patient with progressive dementia who is prone to delirium with any medical problem - most frequently with a UTI.  She seems to be doing this again.  She is improved with hydration and empiric antibiotics.  Urine culture still pending.  She is cared for at home by an attentive family.  Her dementia has progressed to the point where it is straining their ability to care for her.  Still they are committed to DC to home.

## 2015-09-04 NOTE — ED Provider Notes (Signed)
CSN: [id number]     Arrival date & time 09/04/15  1922 History   First MD Initiated Contact with Patient 09/04/15 1927     Chief Complaint  Patient presents with  . Loss of Consciousness  . Altered Mental Status     (Consider location/radiation/quality/duration/timing/severity/associated sxs/prior Treatment) HPI Comments: Level of 5 exception of care due to acuity of condition  Patient is a 81 y.o. female presenting with altered mental status. The history is provided by the patient.  Altered Mental Status Presenting symptoms: behavior changes, lethargy and partial responsiveness   Severity:  Moderate Most recent episode:  Today Episode history:  Single Timing:  Constant Progression:  Worsening Chronicity:  Recurrent Context: recent illness (Pt has history of recurrent UTIs and current UTI)   Context: not alcohol use, not drug use, not head injury, taking medications as prescribed, not a nursing home resident and not a recent change in medication   Associated symptoms: abdominal pain, decreased appetite and weakness   Associated symptoms: normal movement, no agitation, no difficulty breathing, no fever, no nausea, no rash, no slurred speech and no vomiting     Past Medical History  Diagnosis Date  . Hypertension   . GERD (gastroesophageal reflux disease)   . H/O hiatal hernia   . Arthritis   . Syncope 07/13/2014  . Bladder infection, chronic     [name]/notes 08/30/2014  . AV block, complete (HCC)   . Chest pain   . Decreased dorsalis pedis pulse   . Hammer toe   . Hyperlipidemia   . Onychogryposis of toenail   . Osteoporosis   . Scoliosis   . Status post placement of implantable loop recorder   . Syncope   . Uterine disorder   . Diaphragmatic hernia   . Hx of epistaxis   . Chest pain   . Iron deficiency anemia   . Chronic back pain   . Dementia   . Chronic kidney disease (CKD), stage III (moderate)   . Recurrent UTI (urinary tract infection)   . UTI (urinary tract  infection) 04/2015   Past Surgical History  Procedure Laterality Date  . Joint replacement    . Cataract extraction, bilateral Bilateral   . Total hip arthroplasty Right 10/11/2013    Procedure: TOTAL HIP ARTHROPLASTY;  Surgeon: Nestor LewandowskyFrank J Rowan, MD;  Location: [facility] OR;  Service: Orthopedics;  Laterality: Right;  . Loop recorder implant    . Tubal ligation    . Total hip arthroplasty Left 05/2006    [name]/notes 01/22/2011   Family History  Problem Relation Age of Onset  . Stroke Mother 7167    died of stroke  . Hypertension Mother   . Heart disease Mother    Social History  Substance Use Topics  . Smoking status: Never Smoker   . Smokeless tobacco: Never Used  . Alcohol Use: No   OB History    No data available     Review of Systems  Unable to perform ROS: Mental status change  Constitutional: Positive for decreased appetite. Negative for fever.  Gastrointestinal: Positive for abdominal pain. Negative for nausea and vomiting.  Skin: Negative for rash.  Neurological: Positive for weakness.  Psychiatric/Behavioral: Negative for agitation.      Allergies  Review of patient's allergies indicates no known allergies.  Home Medications   Prior to Admission medications   Medication Sig Start Date End Date Taking? Authorizing Provider  acetaminophen (TYLENOL) 500 MG tablet Take 500 mg by mouth every 6 (  six) hours as needed for mild pain. Take one tablet (500 mg) by mouth four times a day   Yes Historical Provider, MD  amLODipine (NORVASC) 2.5 MG tablet Take 2.5 mg by mouth daily. Take one tablet (2.5 mg) by mouth at 10:00 am   Yes Historical Provider, MD  cephALEXin (KEFLEX) 500 MG capsule Take 1 capsule (500 mg total) by mouth 2 (two) times daily. 09/02/15  Yes Stevi Barrett, PA-C  CRANBERRY PO Take 240 mLs by mouth 3 (three) times daily. Cranberry concentrate solution   Yes Historical Provider, MD  D-MANNOSE PO Take 1 tablet by mouth 2 (two) times daily.   Yes Historical Provider, MD   fluticasone (FLONASE) 50 MCG/ACT nasal spray Place 2 sprays into both nostrils daily. Patient taking differently: Place 2 sprays into both nostrils daily as needed for allergies.  12/05/14  Yes Uvaldo Rising, MD  gabapentin (NEURONTIN) 100 MG capsule Take 2 capsules (200 mg total) by mouth 3 (three) times daily. Take 1 capsule three times each day; may take 1 more if needed for breakthrough pain 08/18/15  Yes Uvaldo Rising, MD  nystatin (MYCOSTATIN/NYSTOP) 100000 UNIT/GM POWD Apply to affected area twice daily. Patient taking differently: Apply 1 g topically 2 (two) times daily as needed (Fungal infection).  02/28/15  Yes Uvaldo Rising, MD  omeprazole (PRILOSEC) 20 MG capsule Take one tablet (20 mg) by mouth every morning   Yes Historical Provider, MD  Probiotic Product (ALIGN) 4 MG CAPS Take 4 mg by mouth daily.   Yes Historical Provider, MD  Wheat Dextrin (BENEFIBER) POWD Take 5-10 mg by mouth See admin instructions. Take  daily, and if needed for constipation take another .   Yes Historical Provider, MD  cephALEXin (KEFLEX) 500 MG capsule Take 1 capsule (500 mg total) by mouth 2 (two) times daily. Patient not taking: Reported on 09/02/2015 08/16/15   Uvaldo Rising, MD   BP 177/86 mmHg  Pulse 89  Resp 19  SpO2 100% Physical Exam  Constitutional: She appears cachectic. She appears ill.  HENT:  Head: Head is without abrasion, without contusion and without laceration.  Mouth/Throat: Mucous membranes are dry.  Eyes: Conjunctivae and EOM are normal. Pupils are equal, round, and reactive to light.  Cardiovascular: Normal rate and regular rhythm.  Exam reveals no decreased pulses.   No murmur heard. Pulmonary/Chest: Effort normal and breath sounds normal. No tachypnea and no bradypnea.  Abdominal: Normal appearance. She exhibits no distension. There is tenderness in the suprapubic area. There is no rigidity, no guarding, no CVA tenderness, no tenderness at McBurney's point and negative  Murphy's sign.  Neurological: She is alert. GCS eye subscore is 4. GCS verbal subscore is 1. GCS motor subscore is 6.  Skin: No rash noted.  Psychiatric: She is not agitated. She is noncommunicative.    ED Course  Procedures (including critical care time) Labs Review Labs Reviewed  CBC WITH DIFFERENTIAL/PLATELET - Abnormal; Notable for the following:    Hemoglobin 10.6 (*)    HCT 33.7 (*)    RDW 16.4 (*)    All other components within normal limits  BASIC METABOLIC PANEL - Abnormal; Notable for the following:    CO2 14 (*)    Glucose, Bld 132 (*)    Creatinine, Ser 1.12 (*)    GFR calc non Af Amer 44 (*)    GFR calc Af Amer 50 (*)    Anion gap 16 (*)    All other components within  normal limits  URINALYSIS, ROUTINE W REFLEX MICROSCOPIC (NOT AT [facility]) - Abnormal; Notable for the following:    APPearance TURBID (*)    Hgb urine dipstick MODERATE (*)    Leukocytes, UA LARGE (*)    All other components within normal limits  URINE MICROSCOPIC-ADD ON - Abnormal; Notable for the following:    Squamous Epithelial / LPF 0-5 (*)    Bacteria, UA MANY (*)    All other components within normal limits  I-STAT CG4 LACTIC ACID, ED - Abnormal; Notable for the following:    Lactic Acid, Venous 3.68 (*)    All other components within normal limits  URINE CULTURE    Imaging Review No results found. I have personally reviewed and evaluated these images and lab results as part of my medical decision-making.   EKG Interpretation   Date/Time:  Monday September 04 2015 19:33:14 EST Ventricular Rate:  94 PR Interval:  45 QRS Duration: 88 QT Interval:  384 QTC Calculation: 480 R Axis:   -163 Text Interpretation:  Sinus tachycardia Multiform ventricular premature  complexes Short PR interval LAE, consider biatrial enlargement  Anteroseptal infarct, age indeterminate Artifact in lead(s) I II III aVR  aVL aVF V1 V2 V3 V4 V5 V6 since last tracing no significant change  Confirmed by MILLER  MD,  BRIAN ([id number]) on 09/04/2015 9:27:19 PM      MDM   Final diagnoses:  UTI (urinary tract infection) with pyuria    81 year old Caucasian female past medical history of recurrent UTIs presents in the setting of altered mental status. Per daughters who are with patien,t patient was diagnosed several days ago with urinary tract infection since that time has had a decreasing mental status. They report approximately 2 days ago she was complaining suprapubic pain and pain with urination. They report the last 2 days she has become nonverbal at this point. She has had significant decreased by mouth intake. She has been difficult to arouse. Patient has history of vasovagal syncope and had another vasovagal syncopal event today while trying to have a bowel movement. Patient was difficult to arouse after this event. In setting of these fingdings patient was brought to the emergency department for further evaluation.  On arrival patient is hemodynamically stable and afebrile. Patient did not talk throughout examination and has not spoken for family in approximately 2 days. Patient had mild grimacing with palpation in suprapubic area. Considering these findings this is likely urinary tract infection versus dehydration with altered mental status. We'll obtain urinalysis as well as urine culture. Additionally obtained CBC, BMP and lactic acid. We'll reassess patient after results.  Lactic acid elevated. In setting of this finding patient given 1 L normal saline bolus. Urinalysis consistent with urinary tract infection and patient given 1 g of Rocephin. In setting of these findings patient will be admitted to family medicine for further management of urinary tract infection and dehydration with altered mental status. Laboratory analysis consistent with dehydration. I have discussed case with Dr. Jennette Kettle family medicine.. Patient stable at time of transfer care.  Attending has seen and evaluated the patient and Dr. Hyacinth Meeker  is in agreement with plan.    Stacy Gardner, MD 09/04/15 6045  Eber Hong, MD 09/04/15 2232

## 2015-09-05 ENCOUNTER — Encounter (HOSPITAL_COMMUNITY): Payer: Self-pay | Admitting: *Deleted

## 2015-09-05 ENCOUNTER — Inpatient Hospital Stay (HOSPITAL_COMMUNITY): Payer: Medicare Other

## 2015-09-05 DIAGNOSIS — F05 Delirium due to known physiological condition: Secondary | ICD-10-CM | POA: Insufficient documentation

## 2015-09-05 LAB — BASIC METABOLIC PANEL
Anion gap: 11 (ref 5–15)
BUN: 8 mg/dL (ref 6–20)
CHLORIDE: 107 mmol/L (ref 101–111)
CO2: 22 mmol/L (ref 22–32)
CREATININE: 0.88 mg/dL (ref 0.44–1.00)
Calcium: 8.6 mg/dL — ABNORMAL LOW (ref 8.9–10.3)
GFR, EST NON AFRICAN AMERICAN: 58 mL/min — AB (ref >60)
Glucose, Bld: 91 mg/dL (ref 65–99)
Potassium: 3.7 mmol/L (ref 3.5–5.1)
SODIUM: 140 mmol/L (ref 135–145)

## 2015-09-05 LAB — CBC
HCT: 29.6 % — ABNORMAL LOW (ref 36.0–46.0)
Hemoglobin: 9.3 g/dL — ABNORMAL LOW (ref 12.0–15.0)
MCH: 26.3 pg (ref 26.0–34.0)
MCHC: 31.4 g/dL (ref 30.0–36.0)
MCV: 83.9 fL (ref 78.0–100.0)
PLATELETS: 324 10*3/uL (ref 150–400)
RBC: 3.53 MIL/uL — AB (ref 3.87–5.11)
RDW: 16.5 % — AB (ref 11.5–15.5)
WBC: 6.2 10*3/uL (ref 4.0–10.5)

## 2015-09-05 LAB — TSH: TSH: 2.662 u[IU]/mL (ref 0.350–4.500)

## 2015-09-05 MED ORDER — SODIUM CHLORIDE 0.9 % IV SOLN
INTRAVENOUS | Status: AC
  Administered 2015-09-05 (×2): via INTRAVENOUS

## 2015-09-05 MED ORDER — PANTOPRAZOLE SODIUM 40 MG PO TBEC
40.0000 mg | DELAYED_RELEASE_TABLET | Freq: Every day | ORAL | Status: DC
  Administered 2015-09-05 – 2015-09-07 (×3): 40 mg via ORAL
  Filled 2015-09-05 (×3): qty 1

## 2015-09-05 MED ORDER — POLYETHYLENE GLYCOL 3350 17 G PO PACK
17.0000 g | PACK | Freq: Every day | ORAL | Status: DC
  Administered 2015-09-05 – 2015-09-07 (×3): 17 g via ORAL
  Filled 2015-09-05 (×3): qty 1

## 2015-09-05 MED ORDER — ACETAMINOPHEN 325 MG PO TABS
650.0000 mg | ORAL_TABLET | Freq: Four times a day (QID) | ORAL | Status: DC | PRN
  Administered 2015-09-05 – 2015-09-07 (×5): 650 mg via ORAL
  Filled 2015-09-05 (×5): qty 2

## 2015-09-05 MED ORDER — CETYLPYRIDINIUM CHLORIDE 0.05 % MT LIQD
7.0000 mL | Freq: Two times a day (BID) | OROMUCOSAL | Status: DC
  Administered 2015-09-05 – 2015-09-07 (×4): 7 mL via OROMUCOSAL

## 2015-09-05 MED ORDER — DEXTROSE 5 % IV SOLN
1.0000 g | INTRAVENOUS | Status: DC
Start: 2015-09-05 — End: 2015-09-07
  Administered 2015-09-05 – 2015-09-06 (×2): 1 g via INTRAVENOUS
  Filled 2015-09-05 (×3): qty 10

## 2015-09-05 MED ORDER — SODIUM CHLORIDE 0.9 % IJ SOLN
3.0000 mL | Freq: Two times a day (BID) | INTRAMUSCULAR | Status: DC
  Administered 2015-09-05 – 2015-09-07 (×4): 3 mL via INTRAVENOUS

## 2015-09-05 MED ORDER — ENOXAPARIN SODIUM 40 MG/0.4ML ~~LOC~~ SOLN
40.0000 mg | Freq: Every day | SUBCUTANEOUS | Status: DC
  Administered 2015-09-05 – 2015-09-07 (×3): 40 mg via SUBCUTANEOUS
  Filled 2015-09-05 (×3): qty 0.4

## 2015-09-05 MED ORDER — DONEPEZIL HCL 5 MG PO TABS: 5.0000 mg | ORAL_TABLET | Freq: Every day | ORAL | Status: DC

## 2015-09-05 MED ORDER — BISACODYL 10 MG RE SUPP: 10.0000 mg | Freq: Every day | RECTAL | Status: DC | PRN

## 2015-09-05 MED ORDER — ACETAMINOPHEN 650 MG RE SUPP: 650.0000 mg | Freq: Four times a day (QID) | RECTAL | Status: DC | PRN

## 2015-09-05 MED ORDER — GABAPENTIN 100 MG PO CAPS
200.0000 mg | ORAL_CAPSULE | Freq: Three times a day (TID) | ORAL | Status: DC
  Administered 2015-09-05 – 2015-09-07 (×8): 200 mg via ORAL
  Filled 2015-09-05 (×8): qty 2

## 2015-09-05 MED ORDER — AMLODIPINE BESYLATE 2.5 MG PO TABS
2.5000 mg | ORAL_TABLET | Freq: Every day | ORAL | Status: DC
  Administered 2015-09-05 – 2015-09-06 (×2): 2.5 mg via ORAL
  Filled 2015-09-05 (×2): qty 1

## 2015-09-05 NOTE — Discharge Summary (Signed)
Family Medicine Teaching [facility] Discharge Summary  [name] Medical record number: [id number] Date of birth: Sep 06, 1928 Age: 81 y.o. Gender: female Date of Admission: 09/04/2015  Date of Discharge: 09/07/2015 Admitting Physician: Nestor RampSara L Neal, MD  Primary Care Provider: Uvaldo RisingFLETKE, KYLE, J, MD Consultants: none  Indication for Hospitalization: UTI and syncope  Discharge Diagnoses/Problem List:  Patient Active Problem List   Diagnosis Date Noted  . Dementia with behavioral disturbance   . Delirium due to general medical condition   . UTI (urinary tract infection) with pyuria 09/04/2015  . Altered mental status 09/04/2015  . Syncope 09/04/2015  . Lactic acidosis 09/04/2015  . Pain, intermittent 08/18/2015  . Right arm pain 07/28/2015  . Right ankle instability 05/19/2015  . Asystole (HCC)   . Near syncope   . Abnormal urine odor 04/24/2015  . PFO (patent foramen ovale) 03/23/2015  . Cerebral infarction due to embolism of right middle cerebral artery (HCC) 03/23/2015  . Paradoxical embolism (HCC) 03/23/2015  . Enterococcus UTI 03/07/2015  . Intertrigo 01/24/2015  . Skin lesion of face 01/24/2015  . DVT (deep venous thrombosis) (HCC) 12/27/2014  . Cough 12/27/2014  . Stroke with cerebral ischemia (HCC)   . HLD (hyperlipidemia)   . Essential hypertension   . Focal motor deficit 12/19/2014  . Low back pain radiating to left leg 11/01/2014  . Complete heart block (HCC) 07/14/2014  . Dementia   . Chronic kidney disease (CKD), stage III (moderate) 04/11/2014  . History of urinary anomaly 03/04/2014  . Recurrent UTI 02/28/2014     Disposition: home  Discharge Condition: improved  Discharge Exam:  Temp: [97.6 F (36.4 C)-98.3 F (36.8 C)] 98.3 F (36.8 C) (12/29 0550) Pulse Rate: [76-105] 82 (12/29 0550) Resp: [16-19] 18 (12/29 0550) BP: (137-166)/(64-84) 153/65 mmHg (12/29 0550) SpO2: [98 %-100 %] 100 % (12/29 0550) Physical  Exam: General: sleeping in bed sleeping, smiled back to me when I called her name, son at bedside Cardiovascular: RRR, no murmurs apperciated Respiratory: CTAB, no wheezing, normal WOB Abdomen: soft, non-distended, non-tender, +BS Extremities: no edema Neuro: sleepy, but able to smile back to me when called Brief Hospital Course:  Patient presented to the ED with worsening mental status. She has a history of recurrent UTIs, and her family reported that she was behaving as she normally does when she has a UTI. Patient also had an episode of syncope. This is not uncommon, however it took longer to arouse the patient after this episode, and there was fluid coming out of her mouth and nose after the episode. She was subsequently admitted for treatment of UTI and further work-up of syncope.   UTI Patient was afebrile and without leukocytosis in ED, however did have elevated LA to 3.68 that has trended down to 1.83. UA showed large leuks, many bacteria, and TNTC WBC. She was started on CTX on 12/26 with subsequently improvement in her labs and symptoms. Urine culture from 12/26 grew E. Coli and Klebsiella sensitive to cefazolin and other antibiotics. Accordingly, she was transitioned to oral Keflex on 12/29 and discharged home to complete a 7 days course of antibiotics at home.   AMS/Dementia: with waxing/waning of mental status. Patient is cared for by her son and daughter. Family feels as though they can adequately care for patient. They refused PT/OT eval. Initially, they refused Aricept although they have agreed to try it later on. Accordingly, she was discharged on 5 mg daily. Son and daughter Victoria Lewis(Victoria Lewis) thinks patient is  back to her baseline from mental status standpoint the day of discharge.  Syncope Patient has history of syncope, usually occurring while toileting. The episode of syncope prior to admission also happened while patient was toileting, so etiology was thought to be vasovagal. EKG was  normal. Patient's loop recorder was interrogated and showed no arrhythmias or other abnormalities. TSH was also WNL. However, she has positive orthotast. As a result home amlodipine was held and not resumed on discharge as her BP was within acceptable range.   Issues for Follow Up:  1. Patient started on Aricept 5 mg to slow decline of dementia. Would continue at 5 mg for one month (end date 10/06/15) and increase to 10 mg qd at that time, provided patient is tolerating med well.  2. Amlodipine was held given positive orthotast and history of syncope. Her BP was 153/65 on day of discharge. Can consider resuming if BP rises. 3. UTI: completed 3 days of rocephin. Discharged on 4 more days of Keflex.  Significant Procedures: none  Significant Labs and Imaging:   Recent Labs Lab 09/02/15 1200 09/04/15 2017 09/05/15 0722  WBC 5.5 6.3 6.2  HGB 10.0* 10.6* 9.3*  HCT 32.0* 33.7* 29.6*  PLT 366 321 324    Recent Labs Lab 09/02/15 1006 09/02/15 1200 09/04/15 2017 09/05/15 0722  NA 140 140 137 140  K 4.3 4.5 4.1 3.7  CL 104 106 107 107  CO2  --  26 14* 22  GLUCOSE 81 88 132* 91  BUN CREATININE 1.00 0.99 1.12* 0.88  CALCIUM  --  9.3 9.2 8.6*   EKG (12/26) - artifact in every lead, too difficult to interpret EKG (12/27) - NSR CT head w/o contrast - no acute intracranial abnormalities  Results/Tests Pending at Time of Discharge: none  Discharge Medications:    Medication List    STOP taking these medications        amLODipine 2.5 MG tablet  Commonly known as:  NORVASC      TAKE these medications        acetaminophen 500 MG tablet  Commonly known as:  TYLENOL  Take 500 mg by mouth every 6 (six) hours as needed for mild pain. Take one tablet (500 mg) by mouth four times a day     ALIGN 4 MG Caps  Take 4 mg by mouth daily.     BENEFIBER Powd  Take 5-10 mg by mouth See admin instructions. Take  daily, and if needed for constipation take another .      cephALEXin 500 MG capsule  Commonly known as:  KEFLEX  Take 1 capsule (500 mg total) by mouth 2 (two) times daily.     CRANBERRY PO  Take 240 mLs by mouth 3 (three) times daily. Cranberry concentrate solution     D-MANNOSE PO  Take 1 tablet by mouth 2 (two) times daily.     donepezil 5 MG tablet  Commonly known as:  ARICEPT  Take 1 tablet (5 mg total) by mouth at bedtime.     fluticasone 50 MCG/ACT nasal spray  Commonly known as:  FLONASE  Place 2 sprays into both nostrils daily.     gabapentin 100 MG capsule  Commonly known as:  NEURONTIN  Take 2 capsules (200 mg total) by mouth 3 (three) times daily. Take 1 capsule three times each day; may take 1 more if needed for breakthrough pain     nystatin 100000 UNIT/GM Powd  Apply  to affected area twice daily.     omeprazole 20 MG capsule  Commonly known as:  PRILOSEC  Take one tablet (20 mg) by mouth every morning        Discharge Instructions: Please refer to Patient Instructions section of EMR for full details.  Patient was counseled important signs and symptoms that should prompt return to medical care, changes in medications, dietary instructions, activity restrictions, and follow up appointments.   Follow-Up Appointments: Follow-up Information    Follow up with Uvaldo Rising, MD On 09/15/2015.   Specialty:  Family Medicine   Why:  at 10 am   Contact information:   [address] [place] [address] [phone]       Follow-up Information    Follow up with Uvaldo Rising, MD On 09/15/2015.   Specialty:  Family Medicine   Why:  at 10 am   Contact information:   [address] [place] [address] [phone]       Almon Hercules, MD 09/07/2015, 11:04 PM PGY-1, [facility]

## 2015-09-05 NOTE — Care Management Note (Addendum)
Case Management Note  Patient Details  Name: Victoria Lewis MRN: [id number] Date of Birth: Jul 26, 1928  Subjective/Objective:                  Date-12-27 Initial Assessment Spoke with patient at the bedside along with son Isaias Cowmanllan, and spouse.  Introduced self as Sports coachcase manager and explained role in discharge planning and how to be reached.  [place]. Verified patient anticipates to go home with spouse at time of discharge and will have full- time supervision by family -2 daughters son and spouse, private duty caregiver. Son states that patient is always with 2 caregivers unless its overnight.  Patient has DME walker  wheelchair toilet seat riser. Expressed potential need for no other DME.  Patient denied needing help with their medication.  Patient is driven by spouse to MD appointments.  Verified patient has PCP Dr Randolm IdolFletke. Patient states they currently receive HH services through no one.  Patient was provided choice and selected name for home health needs if they arise.  Plan: CM will continue to follow for discharge planning and [facility] resources.   Lawerance SabalDebbie Suheyb Raucci RN BSN CM [phone]   Action/Plan:  Explained to family that [facility] have same level of strength that she had prior to admission. Family is not receptive to medical advice, and is refusing HH. Family instructed that they can obtain HH through PCP after discharge if they change their minds.   Expected Discharge Date:                  Expected Discharge Plan:  Home w Home Health Services  In-House Referral:     Discharge planning Services  CM Consult  Post Acute Care Choice:    Choice offered to:     DME Arranged:    DME Agency:     HH Arranged:    HH Agency:     Status of Service:  In process, will continue to follow  Medicare Important Message Given:    Date Medicare IM Given:    Medicare IM give by:    Date Additional Medicare  IM Given:    Additional Medicare Important Message give by:     If discussed at Long Length of Stay Meetings, dates discussed:    Additional Comments:  Lawerance SabalDebbie Anthony Roland, RN 09/05/2015, 3:13 PM

## 2015-09-05 NOTE — Progress Notes (Signed)
Family Medicine Teaching Service Daily Progress Note Intern Pager: [phone]  [name] Medical record number: [id number] Date of birth: [birth year] Age: 81 y.o. Gender: female  Primary Care Provider: Uvaldo Rising, MD Consultants: None Code Status: FULL  Pt Overview and Major Events to Date:  12/27 admitted for UTI and syncope  Assessment and Plan: Victoria Lewis is a 81 y.o. female presenting with UTI and syncopal event . PMH is significant for recurrent UTI, Complete AV Block, HTN, CKD, h/o CVA, h/o DVT, HLD, h/o recurrent syncope and scoliosis.   Recurrent UTI: Recently seen in ED on 12/24 for UTI and sent home with Rx for Keflex. ED did not obtain urine culture from that visit. Presented on 12/27 with worsening mental status. UA with large leukocytes, many bacteria, and too numerous to count WBC. Patient afebrile, without leukocytosis but with lactic acid elevated to 3.68 on admission (was WNL on 12/24). Qsofa=1. Urine culture from 11/23 grew E coli and Klebsiella. Klebsiella was not sensitive to Keflex. In Oct 2016, patient had enterococcus UTI which was resistant to all antibiotics except for vancomycin. LA improved to 1.83 today (12/27). WBC still WNL at 6.2 (12/27).  - continue telemetry - continue Rocephin for now, consider switching to Unasyn or Zosyn if not improving   - f/u urine culture - too young to read  Syncopal Episode: Likely vasovagal given temporal association with restroom use, and past episodes of syncope related to toileting. Family did report that this episode was different with her having some fluid coming out of mouth/nose after episode. Arrhythmia cannot be ruled out, however patient does have a loop recorded which can be interrogated. Orthostatic hypotension is also a possibility given ongoing UTI. However, she was hemodynamically stable on arrival to ED. Patient with previous history of heart block. TSH WNL. EKG in ED with artifact in every  lead and difficult to interpret; repeat EKG (12/27) NSR.  - consider contacting patient's cardiologist for loop interrogation - cont telemetry - consider neurology consult for history concerning for questionable seizure vs syncopal episode - CT head today  AKI on CKD-3: Cr 1.12. B/l 0.8-1.0. Likely 2/2 to dehydration from recent poor PO intake. Cr improved to 0.88 (12/27) after IVF administration. - cont MIVFs at 100 cc/hr  - cont AM BMP  Hypertension: BPs elevated to 160s-170s/80s at admission and still elevated this AM at 158/85. - cont home amlodipine 2.5 mg; consider increasing if further BP control needed  - monitor BP closely  Constipation: Patient reports chronic constipation over the past few months in light of antibiotic use. Often use suppositories at home.  - Dulcolax suppository daily prn  - Miralax daily   Scoliosis: on scheduled tylenol and gabapentin at home. - cont home meds.  Hyperlipidemia: not medically treate at home. Lipid panel in 12/2014: CH 291, TG 178, HDL 44 LDL 211. Unclear the benefit to risk ratio in her age so will not treat at this time. - Consider beginning meds outpatient if benefit outweighs risk  History of heart block with BB: in 07/2014. EKG in ED with severe artifact in all leads. Repeat EKG (12/27) NSR.  - cont telemetry  History of CVA/PFO/DVT: in 12/2014. Treated with Eliquis in the past. Not currently medically treated.   FEN/GI: Heart Healthy Diet; Protonix; MIVF NS@100  cc/hr  Prophylaxis: Lovenox   Disposition: home pending medical improvement   Subjective:  Patient appears improved this AM. She is speaking and was able to answer all questions  appropriately. She reports that she does not feel well but cannot identify what exactly is making her feel unwell. Denies pain, including suprapubic pain.   Objective: Temp:  [97.9 F (36.6 C)-98.2 F (36.8 C)] 98.2 F (36.8 C) (12/27 0649) Pulse Rate:  [87-91] 91 (12/27 0649) Resp:   [16-23] 16 (12/27 0649) BP: (158-181)/(80-91) 158/85 mmHg (12/27 0649) SpO2:  [99 %-100 %] 100 % (12/27 0649) Weight:  [136 lb 7.4 oz (61.9 kg)] 136 lb 7.4 oz (61.9 kg) (12/26 2351) Physical Exam: General: sitting up in bed in NAD, son at bedside Cardiovascular: RRR, no murmurs apperciated Respiratory: CTAB, no wheezing, normal WOB Abdomen: soft, non-distended, non-tender, +BS Extremities: trace pitting edema to midshin in bilateral LE Neuro: answers questions appropriately, no focal deficits   Laboratory:  Recent Labs Lab 09/02/15 1006 09/02/15 1200 09/04/15 2017  WBC  --  5.5 6.3  HGB 12.2 10.0* 10.6*  HCT 36.0 32.0* 33.7*  PLT  --  366 321    Recent Labs Lab 09/02/15 1006 09/02/15 1200 09/04/15 2017  NA 140 140 137  K 4.3 4.5 4.1  CL 104 106 107  CO2  --  26 14*  BUN 18 14 13   CREATININE 1.00 0.99 1.12*  CALCIUM  --  9.3 9.2  GLUCOSE 81 88 132*    Imaging/Diagnostic Tests: No results found.   Victoria SaaAbigail Joseph Takisha Pelle, MD 09/05/2015, 7:31 AM PGY-1, [facility] FPTS Intern pager: [phone], text pages welcome

## 2015-09-06 DIAGNOSIS — F03918 Unspecified dementia, unspecified severity, with other behavioral disturbance: Secondary | ICD-10-CM | POA: Insufficient documentation

## 2015-09-06 DIAGNOSIS — F0391 Unspecified dementia with behavioral disturbance: Secondary | ICD-10-CM

## 2015-09-06 NOTE — Progress Notes (Signed)
Family Medicine Teaching Service Daily Progress Note Intern Pager: [phone]  [name] Medical record number: [id number] Date of birth: [birth year] Age: 81 y.o. Gender: female  Primary Care Provider: Uvaldo Rising, MD Consultants: None Code Status: FULL  Pt Overview and Major Events to Date:  12/27 admitted for UTI and syncope  Assessment and Plan: Victoria Lewis is a 81 y.o. female presenting with UTI and syncopal event . PMH is significant for recurrent UTI, Complete AV Block, HTN, CKD, h/o CVA, h/o DVT, HLD, h/o recurrent syncope and scoliosis.   Recurrent UTI: Recently seen in ED on 12/24 for UTI and sent home with Rx for Keflex. ED did not obtain urine culture from that visit. Presented on 12/27 with worsening mental status. UA with large leukocytes, many bacteria, and too numerous to count WBC. Patient afebrile, without leukocytosis but with lactic acid elevated to 3.68 on admission (was WNL on 12/24). Qsofa=1. Urine culture from 11/23 grew E coli and Klebsiella. Klebsiella was not sensitive to Keflex. In Oct 2016, patient had enterococcus UTI which was resistant to all antibiotics except for vancomycin. LA improved to 1.83 today (12/27). WBC still WNL at 6.2 (12/27). Urine cx grew - continue telemetry - continue Rocephin for now, consider switching to Unasyn or Zosyn if not improving   - Urine culture  >=100,000 COLONIES/mL GRAM NEGATIVE RODS, awaiting sensitivites  Dementia: History of dementia with waxing/waning of mental status. Patient is cared for by her son and daughter. Family feels as though they can adequately care for patient. Aricept was started yesterday but son and daughter are opposed to this medication. They have discussed it before with Dr. Randolm Idol.  - DC Aricept per patient's children request. Will reconsider when seeing Dr. Randolm Idol on outpatient setting  Syncopal Episode: Likely vasovagal given temporal association with restroom use, and past  episodes of syncope related to toileting. Family did report that this episode was different with her having some fluid coming out of mouth/nose after episode. Arrhythmia cannot be ruled out, however patient does have a loop recorded which can be interrogated. Orthostatic hypotension is also a possibility given ongoing UTI. However, she was hemodynamically stable on arrival to ED. Patient with previous history of heart block. TSH WNL. EKG in ED with artifact in every lead and difficult to interpret; repeat EKG (12/27) NSR. CT head unremarkable for acute intracranial abnormality. - cont telemetry - Obtain orthostatic vitals today  AKI on CKD-3: Resolved AKI - No IVF at this time  Hypertension: BPs elevated to 160s-170s/80s at admission and still elevated this AM at 165/80. - DC Amlodipine while in the hospital due to concern for orthostatic hypotension. May consider resuming if orthostatic vitals come back normal - monitor BP closely  Constipation: Patient reports chronic constipation over the past few months in light of antibiotic use. Often use suppositories at home.  - Dulcolax suppository daily prn  - Miralax daily   Scoliosis: on scheduled tylenol and gabapentin at home. - cont home meds.  Hyperlipidemia: not medically treated at home. Lipid panel in 12/2014: CH 291, TG 178, HDL 44 LDL 211. Unclear the benefit to risk ratio in her age so will not treat at this time. - Consider beginning meds outpatient if benefit outweighs risk  History of heart block with BB: in 07/2014. EKG in ED with severe artifact in all leads. Repeat EKG (12/27) NSR.  - cont telemetry  History of CVA/PFO/DVT: in 12/2014. Treated with Eliquis in the past. Not currently  medically treated.   FEN/GI: Heart Healthy Diet; Protonix  Prophylaxis: Lovenox   Disposition: home pending medical improvement   Subjective:  Patient was barely arousable this morning. Her son and daughter were in the room. Apparently patient  stayed up until 3 am so she is very sleepy on exam this morning. Opened her eyes when shoulders were shaken but then closed her eyes again. Is not answering questions this morning.   Objective: Temp:  [98.7 F (37.1 C)-99 F (37.2 C)] 99 F (37.2 C) (12/27 2116) Pulse Rate:  [85-94] 94 (12/27 2116) Resp:  [20] 20 (12/27 2116) BP: (146-165)/(69-80) 165/80 mmHg (12/27 2116) SpO2:  [98 %-100 %] 98 % (12/27 2116) Physical Exam: General: sitting up in bed sleeping, son and daughter at bedside Cardiovascular: RRR, no murmurs apperciated Respiratory: CTAB, no wheezing, normal WOB Abdomen: soft, non-distended, non-tender, +BS Extremities: no edema Neuro: not alert, very sleepy  Laboratory:  Recent Labs Lab 09/02/15 1200 09/04/15 2017 09/05/15 0722  WBC 5.5 6.3 6.2  HGB 10.0* 10.6* 9.3*  HCT 32.0* 33.7* 29.6*  PLT 366 321 324    Recent Labs Lab 09/02/15 1200 09/04/15 2017 09/05/15 0722  NA 140 137 140  K 4.5 4.1 3.7  CL 106 107 107  CO2 26 14* 22  BUN 14 13 8   CREATININE 0.99 1.12* 0.88  CALCIUM 9.3 9.2 8.6*  GLUCOSE 88 132* 91    Imaging/Diagnostic Tests: No results found.   Beaulah Dinninghristina M Gambino, MD 09/06/2015, 6:58 AM PGY-1, [place] [facility] FPTS Intern pager: [phone], text pages welcome

## 2015-09-07 ENCOUNTER — Encounter: Payer: Self-pay | Admitting: Internal Medicine

## 2015-09-07 LAB — URINE CULTURE: Culture: >=100000

## 2015-09-07 MED ORDER — DONEPEZIL HCL 5 MG PO TABS: 5.0000 mg | ORAL_TABLET | Freq: Every day | ORAL | Status: DC

## 2015-09-07 MED ORDER — CEPHALEXIN 500 MG PO CAPS: 500.0000 mg | ORAL_CAPSULE | Freq: Two times a day (BID) | ORAL | Status: DC

## 2015-09-07 MED ORDER — DONEPEZIL HCL 5 MG PO TABS
5.0000 mg | ORAL_TABLET | Freq: Every day | ORAL | Status: DC
Start: 2015-09-07 — End: 2015-09-07

## 2015-09-07 MED ORDER — CEPHALEXIN 500 MG PO CAPS
500.0000 mg | ORAL_CAPSULE | Freq: Two times a day (BID) | ORAL | Status: DC
  Administered 2015-09-07: 500 mg via ORAL
  Filled 2015-09-07: qty 1

## 2015-09-07 NOTE — Discharge Instructions (Signed)
It has been a pleasure taking care of you! You were admitted due to urinary tract infection. We have treated your with antibiotics.  We will be discharging you on more of this medication and other additional medications that you need to continue taking after you leave the hospital. We may have also made some adjustments to your other medications. Please, make sure to read the directions before you take them. The names and directions on how to take these medications are found on this discharge paper under medication section.  You also need a follow up with your primary care doctor. The address, date and time are found on the discharge paper under follow up section.  Take care,

## 2015-09-07 NOTE — Care Management Important Message (Signed)
Important Message  Patient Details  Name: Tera HelperBetty C Rotan MRN: [id number] Date of Birth: April 22, 1928   Medicare Important Message Given:  Yes    Lawerance Sabalebbie Tanielle Emigh, RN 09/07/2015, 1:30 PMImportant Message  Patient Details  Name: Tera HelperBetty C Hendricks MRN: [id number] Date of Birth: April 22, 1928   Medicare Important Message Given:  Yes    Lawerance Sabalebbie Sharron Simpson, RN 09/07/2015, 1:30 PM

## 2015-09-07 NOTE — Progress Notes (Signed)
Family Medicine Teaching Service Daily Progress Note Intern Pager: [phone]  [name] Medical record number: [id number] Date of birth: Dec 02, 1927 Age: 81 y.o. Gender: female  Primary Care Provider: Uvaldo Rising, MD Consultants: None Code Status: FULL  Pt Overview and Major Events to Date:  12/27 admitted for UTI and syncope  Assessment and Plan: Victoria Lewis is a 81 y.o. female presenting with UTI and syncopal event . PMH is significant for recurrent UTI, Complete AV Block, HTN, CKD, h/o CVA, h/o DVT, HLD, h/o recurrent syncope and scoliosis.   Recurrent UTI: Recently seen in ED on 12/24 for UTI and sent home with Rx for Keflex. ED did not obtain urine culture from that visit. Presented on 12/27 with worsening mental status. UA with large leukocytes, many bacteria, and too numerous to count WBC. Patient afebrile, without leukocytosis but with lactic acid elevated to 3.68 on admission (was WNL on 12/24). Qsofa=1. Urine culture from 11/26 grew E coli and Klebsiella. Both sensitive to Cefazolin.  - completed three days of Rocephin. - discharge on three days of Keflex    Dementia: History of dementia with waxing/waning of mental status. Patient is cared for by her son and daughter. Family feels as though they can adequately care for patient. Aricept was started yesterday but son and daughter are opposed to this medication. They have discussed it before with Dr. Randolm Idol.  - Start Aricept per patient's daughter Tobi Bastos) request.   Syncopal Episode: Likely vasovagal given temporal association with restroom use, and past episodes of syncope related to toileting. Family did report that this episode was different with her having some fluid coming out of mouth/nose after episode. Arrhythmia cannot be ruled out, however patient does have a loop recorded which can be interrogated. Orthostatic hypotension is also a possibility given ongoing UTI. However, she was hemodynamically  stable on arrival to ED. Patient with previous history of heart block. TSH WNL. EKG in ED with artifact in every lead and difficult to interpret; repeat EKG (12/27) NSR. CT head unremarkable for acute intracranial abnormality. Orthostatic positive. - cont telemetry - Hold of amlodipine given orthostatic labs. BP is also stable  AKI on CKD-3: Resolved AKI. Cr 0.88 - No IVF at this time  Hypertension: BPs elevated to 160s-170s/80s at admission and153/65 this AM . D/cd Amlodipine while in the hospital due to concern for orthostatic hypotension. Orthostatic vitals positive with pulse rate - monitor BP closely - Hold of amlodipine given orthostatic labs. BP is also stable  Constipation: has BM yesterday. Often use suppositories at home.  - Dulcolax suppository daily prn  - Miralax daily   Scoliosis: on scheduled tylenol and gabapentin at home. - cont home meds.  Hyperlipidemia: not medically treated at home. Lipid panel in 12/2014: CH 291, TG 178, HDL 44 LDL 211. Unclear the benefit to risk ratio in her age so will not treat at this time. - Consider beginning meds outpatient if benefit outweighs risk  History of heart block with BB: in 07/2014. EKG in ED with severe artifact in all leads. Repeat EKG (12/27) NSR.  - cont telemetry  History of CVA/PFO/DVT: in 12/2014. Treated with Eliquis in the past. Not currently medically treated.   FEN/GI: Heart Healthy Diet; Protonix  Prophylaxis: Lovenox   Disposition: home today on keflex for 4 more days for her UTI  Subjective:  Appears sleepy. She was able to smile back when I called her name and greeted. Son by bed side. Thinks she is  back to baseline. He says she is usually sleepy in the morning. Talked to Daughter over the phone who was here last night. She says patient was talking and at baseline last night. She says family had discussion about trial of Aricept and it is okay to start today. She has BM yesterday.  Objective: Temp:  [97.6 F  (36.4 C)-98.3 F (36.8 C)] 98.3 F (36.8 C) (12/29 0550) Pulse Rate:  [76-105] 82 (12/29 0550) Resp:  [16-19] 18 (12/29 0550) BP: (137-166)/(64-84) 153/65 mmHg (12/29 0550) SpO2:  [98 %-100 %] 100 % (12/29 0550) Physical Exam: General: sleeping in bed sleeping, smiled back to me when I called her name, son at bedside Cardiovascular: RRR, no murmurs apperciated Respiratory: CTAB, no wheezing, normal WOB Abdomen: soft, non-distended, non-tender, +BS Extremities: no edema Neuro: sleepy, but able to smile back to me when called  Laboratory:  Recent Labs Lab 09/02/15 1200 09/04/15 2017 09/05/15 0722  WBC 5.5 6.3 6.2  HGB 10.0* 10.6* 9.3*  HCT 32.0* 33.7* 29.6*  PLT 366 321 324    Recent Labs Lab 09/02/15 1200 09/04/15 2017 09/05/15 0722  NA 140 137 140  K 4.5 4.1 3.7  CL 106 107 107  CO2 26 14* 22  BUN 14 13 8   CREATININE 0.99 1.12* 0.88  CALCIUM 9.3 9.2 8.6*  GLUCOSE 88 132* 91    Imaging/Diagnostic Tests: No results found.  Almon Herculesaye T Markell Sciascia, MD 09/07/2015, 7:21 AM PGY-1, [facility] FPTS Intern pager: [phone], text pages welcome

## 2015-09-07 NOTE — Progress Notes (Signed)
Pt and family given discharge instructions, prescriptions, and care notes. Pt verbalized understanding AEB no further questions or concerns at this time. IV was discontinued, no redness, pain, or swelling noted at this time. Telemetry discontinued and Centralized Telemetry was notified. Pt left the floor via wheelchair with staff in stable condition. 

## 2015-09-08 LAB — CUP PACEART REMOTE DEVICE CHECK
Date Time Interrogation Session: 20161120013803
Date Time Interrogation Session: 20161220020657

## 2015-09-15 ENCOUNTER — Encounter: Payer: Self-pay | Admitting: Family Medicine

## 2015-09-15 ENCOUNTER — Ambulatory Visit (INDEPENDENT_AMBULATORY_CARE_PROVIDER_SITE_OTHER): Payer: Medicare Other | Admitting: Family Medicine

## 2015-09-15 VITALS — BP 101/85 | HR 96 | Temp 98.3°F

## 2015-09-15 DIAGNOSIS — N390 Urinary tract infection, site not specified: Secondary | ICD-10-CM

## 2015-09-15 DIAGNOSIS — I10 Essential (primary) hypertension: Secondary | ICD-10-CM

## 2015-09-15 DIAGNOSIS — T148 Other injury of unspecified body region: Secondary | ICD-10-CM

## 2015-09-15 DIAGNOSIS — F0390 Unspecified dementia without behavioral disturbance: Secondary | ICD-10-CM

## 2015-09-15 DIAGNOSIS — R55 Syncope and collapse: Secondary | ICD-10-CM | POA: Diagnosis not present

## 2015-09-15 DIAGNOSIS — T148XXA Other injury of unspecified body region, initial encounter: Secondary | ICD-10-CM

## 2015-09-15 NOTE — Assessment & Plan Note (Signed)
Trial of Aricept initiated while hospitalized. Patient did not tolerate the medication. -Discontinue Aricept

## 2015-09-15 NOTE — Assessment & Plan Note (Signed)
Stop amlodipine due to recent syncopal episode and low blood pressure today. -Monitor clinically

## 2015-09-15 NOTE — Assessment & Plan Note (Signed)
Patient presents for hospital follow-up for Escherichia coli and Klebsiella UTI. Patient is generally improving after completion of antibiotic regimen. -No further antibiotic therapy at this time -Continue to encourage by mouth fluid intake -Return precautions discussed

## 2015-09-15 NOTE — Assessment & Plan Note (Signed)
1 abrasion and 1 laceration of the left lateral leg. No evidence of cellulitis. -Continue daily dressing changes -Return precautions discussed

## 2015-09-15 NOTE — Patient Instructions (Signed)
It was nice to see you today.   Leg scrapes - continue to change dressing daily, watch for warmth or redness  Urinary Tract Infection - treated effectively, continue to push fluids, goal of 80 mL per day, if diarrhea does not stop please call me  Passing out - likely vasovagal, due to low blood pressure, continue to hold the Amlodipine

## 2015-09-15 NOTE — Assessment & Plan Note (Signed)
Follow-up for hospitalization of syncopal episode. Likely vasovagal in nature. No further episodes. Loop recorder unremarkable. -Stop amlodipine to prevent low BP -Encourage 70-80 mL's of fluid per day

## 2015-09-15 NOTE — Progress Notes (Signed)
   Subjective:    Patient ID: Victoria Lewis, female    DOB: 08-29-1928, 82 y.o.   MRN: [id number]  HPI 82 year old female present for hospital follow-up for UTI and syncope. She is accompanied by her daughter, son, and husband.  Hospitalization - admitted from 10/04/2014 through 10/05/2014 after syncopal episode while having a bowel movement at home, found to have Escherichia coli and Klebsiella UTI, treated with Rocephin while hospitalized and sent home to complete course of Keflex; I personally reviewed the discharge summary  UTI - symptoms have generally improved, no fevers, improved by mouth intake, she developed loose stools after starting the antibiotic  Syncope - likely vasovagal in nature, loop recorder was evaluated while hospitalized and no arrhythmia was noted, no further episodes since returning home  Dementia - patient started on Aricept during hospitalization, patient became very somnolent with this medication while at home, the family decided to discontinue and symptoms have improved  Skin abrasion - patient sustained 2 cuts to her left leg which resulted from injury from her wheelchair  Social-lives with husband, nonsmoker  Review of Systems  Constitutional: Negative for fever, chills and fatigue.  Respiratory: Negative for cough and shortness of breath.   Gastrointestinal: Positive for diarrhea. Negative for nausea and abdominal pain.       Objective:   Physical Exam Vitals: Reviewed Gen.: Pleasant female, no acute distress Cardiac: Regular rate and rhythm, S1 and S2 present, no murmurs, no heaves or thrills Respiratory: Clear to discussion bilaterally, normal effort Abdomen: Soft, nontender, bowel sounds present Skin: one abrasion and one small laceration noted over the left lateral leg, no evidence of cellulitis, no warmth     Assessment & Plan:  Recurrent UTI Patient presents for hospital follow-up for Escherichia coli and Klebsiella UTI. Patient is  generally improving after completion of antibiotic regimen. -No further antibiotic therapy at this time -Continue to encourage by mouth fluid intake -Return precautions discussed  Essential hypertension Stop amlodipine due to recent syncopal episode and low blood pressure today. -Monitor clinically  Dementia Trial of Aricept initiated while hospitalized. Patient did not tolerate the medication. -Discontinue Aricept  Syncope Follow-up for hospitalization of syncopal episode. Likely vasovagal in nature. No further episodes. Loop recorder unremarkable. -Stop amlodipine to prevent low BP -Encourage 70-80 mL's of fluid per day  Skin abrasion 1 abrasion and 1 laceration of the left lateral leg. No evidence of cellulitis. -Continue daily dressing changes -Return precautions discussed

## 2015-09-21 ENCOUNTER — Telehealth: Payer: Self-pay | Admitting: Family Medicine

## 2015-09-21 DIAGNOSIS — K5900 Constipation, unspecified: Secondary | ICD-10-CM

## 2015-09-21 NOTE — Telephone Encounter (Signed)
Pt daughter has a question: is it safe for pt to use suppository every day?

## 2015-09-21 NOTE — Telephone Encounter (Signed)
Will be at [phone] until 3. Afterwards  [phone] and ask for nancy [phone]--after 4

## 2015-09-21 NOTE — Telephone Encounter (Signed)
Returned call, Victoria Lewis was not home, left message to return call.

## 2015-09-22 DIAGNOSIS — K5900 Constipation, unspecified: Secondary | ICD-10-CM | POA: Insufficient documentation

## 2015-09-22 MED ORDER — DOCUSATE SODIUM 100 MG PO CAPS: 100.0000 mg | ORAL_CAPSULE | Freq: Two times a day (BID) | ORAL | Status: AC

## 2015-09-22 NOTE — Telephone Encounter (Signed)
Victoria Lewis also noted that mother has passed out a number of times over the past week, always rebounds and does well. Unclear etiology, no fevers. Will continue to monitor at this point. Return precautions discussed.

## 2015-09-22 NOTE — Telephone Encounter (Signed)
Returned patient call. Has had to use suppositories to produce BM. Only on Benefiber. Will start colace and told to avoid suppositories if possible.

## 2015-09-26 ENCOUNTER — Encounter: Payer: Self-pay | Admitting: Family Medicine

## 2015-09-26 ENCOUNTER — Ambulatory Visit (INDEPENDENT_AMBULATORY_CARE_PROVIDER_SITE_OTHER): Payer: Medicare Other | Admitting: Family Medicine

## 2015-09-26 VITALS — BP 122/68 | HR 105 | Temp 98.4°F

## 2015-09-26 DIAGNOSIS — R05 Cough: Secondary | ICD-10-CM

## 2015-09-26 DIAGNOSIS — R058 Other specified cough: Secondary | ICD-10-CM | POA: Insufficient documentation

## 2015-09-26 NOTE — Patient Instructions (Signed)

## 2015-09-26 NOTE — Assessment & Plan Note (Signed)
Symptoms and time course consistent with post viral cough Explained natural course and symptomatic treatment with family Clear lung exam and no fevers make pneumonia highly unlikely Will not get a chest x-ray at this time Discussed return precautions including fevers and production of purulent sputum

## 2015-09-26 NOTE — Progress Notes (Signed)
   Subjective:   GENIENE LIST is a 82 y.o. female  here for same day appt for cough.  Cough - patient started having cough, hoarse voice, runny nose, and congestion about one week ago. She continues to have cough, though other symptoms have resolved. Family denies any production of sputum or fevers. Overall she seems to be getting better, but they're wanted to make sure that she does not have pneumonia. They're giving her cough medicine occasionally which seems to make her feel better.  Review of Systems:  Per HPI. All other systems reviewed and are negative.   PMH, PSH, Medications, Allergies, and FmHx reviewed and updated in EMR.  Social History: never smoker  Objective:  BP 122/68 mmHg  Pulse 105  Temp(Src) 98.4 F (36.9 C) (Oral)  SpO2 96%  Gen:  82 y.o. female in NAD, sitting in wheelchair HEENT: NCAT, MMM, EOMI, PERRL, anicteric sclerae CV: RRR, no MRG Resp: Non-labored, CTAB, no wheezes noted Ext: WWP, no edema Neuro: Alert, mostly nonverbal    Assessment & Plan:     QUANISHA DREWRY is a 82 y.o. female here for cough  Post-viral cough syndrome Symptoms and time course consistent with post viral cough Explained natural course and symptomatic treatment with family Clear lung exam and no fevers make pneumonia highly unlikely Will not get a chest x-ray at this time Discussed return precautions including fevers and production of purulent sputum    Erasmo Downer, MD MPH PGY-2,  [facility] 09/26/2015  1:53 PM

## 2015-09-27 ENCOUNTER — Ambulatory Visit (INDEPENDENT_AMBULATORY_CARE_PROVIDER_SITE_OTHER): Payer: Medicare Other | Admitting: *Deleted

## 2015-09-27 DIAGNOSIS — R55 Syncope and collapse: Secondary | ICD-10-CM

## 2015-09-28 NOTE — Progress Notes (Signed)
Carelink Summary Report / Loop Recorder 

## 2015-10-02 ENCOUNTER — Encounter (HOSPITAL_COMMUNITY): Payer: Self-pay

## 2015-10-02 ENCOUNTER — Inpatient Hospital Stay (HOSPITAL_COMMUNITY)
Admission: EM | Admit: 2015-10-02 | Discharge: 2015-10-05 | DRG: 065 | Disposition: A | Discharge status: Hospice | Payer: Medicare Other | Attending: Family Medicine | Admitting: Family Medicine

## 2015-10-02 ENCOUNTER — Emergency Department (HOSPITAL_COMMUNITY): Payer: Medicare Other

## 2015-10-02 DIAGNOSIS — R410 Disorientation, unspecified: Secondary | ICD-10-CM

## 2015-10-02 DIAGNOSIS — Z7189 Other specified counseling: Secondary | ICD-10-CM | POA: Insufficient documentation

## 2015-10-02 DIAGNOSIS — N183 Chronic kidney disease, stage 3 (moderate): Secondary | ICD-10-CM | POA: Diagnosis present

## 2015-10-02 DIAGNOSIS — Z96643 Presence of artificial hip joint, bilateral: Secondary | ICD-10-CM | POA: Diagnosis present

## 2015-10-02 DIAGNOSIS — E785 Hyperlipidemia, unspecified: Secondary | ICD-10-CM | POA: Diagnosis present

## 2015-10-02 DIAGNOSIS — N390 Urinary tract infection, site not specified: Secondary | ICD-10-CM | POA: Diagnosis present

## 2015-10-02 DIAGNOSIS — Z515 Encounter for palliative care: Secondary | ICD-10-CM | POA: Diagnosis present

## 2015-10-02 DIAGNOSIS — M545 Low back pain: Secondary | ICD-10-CM | POA: Diagnosis present

## 2015-10-02 DIAGNOSIS — Z79899 Other long term (current) drug therapy: Secondary | ICD-10-CM | POA: Diagnosis not present

## 2015-10-02 DIAGNOSIS — M419 Scoliosis, unspecified: Secondary | ICD-10-CM | POA: Diagnosis present

## 2015-10-02 DIAGNOSIS — Z86718 Personal history of other venous thrombosis and embolism: Secondary | ICD-10-CM | POA: Insufficient documentation

## 2015-10-02 DIAGNOSIS — K219 Gastro-esophageal reflux disease without esophagitis: Secondary | ICD-10-CM | POA: Diagnosis present

## 2015-10-02 DIAGNOSIS — I739 Peripheral vascular disease, unspecified: Secondary | ICD-10-CM | POA: Diagnosis present

## 2015-10-02 DIAGNOSIS — G8929 Other chronic pain: Secondary | ICD-10-CM | POA: Diagnosis present

## 2015-10-02 DIAGNOSIS — R310 Gross hematuria: Secondary | ICD-10-CM | POA: Diagnosis present

## 2015-10-02 DIAGNOSIS — I442 Atrioventricular block, complete: Secondary | ICD-10-CM | POA: Diagnosis present

## 2015-10-02 DIAGNOSIS — Z888 Allergy status to other drugs, medicaments and biological substances status: Secondary | ICD-10-CM | POA: Diagnosis not present

## 2015-10-02 DIAGNOSIS — Z7982 Long term (current) use of aspirin: Secondary | ICD-10-CM | POA: Diagnosis not present

## 2015-10-02 DIAGNOSIS — R4182 Altered mental status, unspecified: Secondary | ICD-10-CM

## 2015-10-02 DIAGNOSIS — R8271 Bacteriuria: Secondary | ICD-10-CM | POA: Diagnosis present

## 2015-10-02 DIAGNOSIS — F0151 Vascular dementia with behavioral disturbance: Secondary | ICD-10-CM | POA: Diagnosis present

## 2015-10-02 DIAGNOSIS — I63531 Cerebral infarction due to unspecified occlusion or stenosis of right posterior cerebral artery: Secondary | ICD-10-CM | POA: Diagnosis present

## 2015-10-02 DIAGNOSIS — I63431 Cerebral infarction due to embolism of right posterior cerebral artery: Secondary | ICD-10-CM | POA: Diagnosis not present

## 2015-10-02 DIAGNOSIS — I63522 Cerebral infarction due to unspecified occlusion or stenosis of left anterior cerebral artery: Secondary | ICD-10-CM | POA: Diagnosis present

## 2015-10-02 DIAGNOSIS — M810 Age-related osteoporosis without current pathological fracture: Secondary | ICD-10-CM | POA: Diagnosis present

## 2015-10-02 DIAGNOSIS — R414 Neurologic neglect syndrome: Secondary | ICD-10-CM | POA: Diagnosis present

## 2015-10-02 DIAGNOSIS — Q211 Atrial septal defect: Secondary | ICD-10-CM

## 2015-10-02 DIAGNOSIS — I639 Cerebral infarction, unspecified: Secondary | ICD-10-CM | POA: Diagnosis present

## 2015-10-02 DIAGNOSIS — I6340 Cerebral infarction due to embolism of unspecified cerebral artery: Secondary | ICD-10-CM | POA: Diagnosis not present

## 2015-10-02 DIAGNOSIS — F0390 Unspecified dementia without behavioral disturbance: Secondary | ICD-10-CM

## 2015-10-02 DIAGNOSIS — W19XXXD Unspecified fall, subsequent encounter: Secondary | ICD-10-CM | POA: Diagnosis not present

## 2015-10-02 DIAGNOSIS — Z66 Do not resuscitate: Secondary | ICD-10-CM | POA: Diagnosis present

## 2015-10-02 DIAGNOSIS — I129 Hypertensive chronic kidney disease with stage 1 through stage 4 chronic kidney disease, or unspecified chronic kidney disease: Secondary | ICD-10-CM | POA: Diagnosis present

## 2015-10-02 DIAGNOSIS — W19XXXA Unspecified fall, initial encounter: Secondary | ICD-10-CM | POA: Insufficient documentation

## 2015-10-02 LAB — APTT: APTT: 30 s (ref 24–37)

## 2015-10-02 LAB — URINALYSIS, ROUTINE W REFLEX MICROSCOPIC
Bilirubin Urine: NEGATIVE
Glucose, UA: NEGATIVE mg/dL
Ketones, ur: NEGATIVE mg/dL
NITRITE: POSITIVE — AB
PROTEIN: NEGATIVE mg/dL
Specific Gravity, Urine: 1.005 (ref 1.005–1.030)
pH: 6 (ref 5.0–8.0)

## 2015-10-02 LAB — I-STAT CG4 LACTIC ACID, ED: Lactic Acid, Venous: 1.67 mmol/L (ref 0.5–2.0)

## 2015-10-02 LAB — COMPREHENSIVE METABOLIC PANEL
ALBUMIN: 3.7 g/dL (ref 3.5–5.0)
ALT: 11 U/L — ABNORMAL LOW (ref 14–54)
ANION GAP: 13 (ref 5–15)
AST: 22 U/L (ref 15–41)
Alkaline Phosphatase: 98 U/L (ref 38–126)
BUN: 13 mg/dL (ref 6–20)
CHLORIDE: 101 mmol/L (ref 101–111)
CO2: 22 mmol/L (ref 22–32)
Calcium: 9.5 mg/dL (ref 8.9–10.3)
Creatinine, Ser: 0.91 mg/dL (ref 0.44–1.00)
GFR calc Af Amer: >60 mL/min (ref >60)
GFR calc non Af Amer: 56 mL/min — ABNORMAL LOW (ref >60)
GLUCOSE: 92 mg/dL (ref 65–99)
POTASSIUM: 4.3 mmol/L (ref 3.5–5.1)
SODIUM: 136 mmol/L (ref 135–145)
Total Bilirubin: 0.3 mg/dL (ref 0.3–1.2)
Total Protein: 6.6 g/dL (ref 6.5–8.1)

## 2015-10-02 LAB — CBC WITH DIFFERENTIAL/PLATELET
BASOS PCT: 1 %
Basophils Absolute: 0.1 10*3/uL (ref 0.0–0.1)
EOS ABS: 0.1 10*3/uL (ref 0.0–0.7)
EOS PCT: 1 %
HCT: 32.2 % — ABNORMAL LOW (ref 36.0–46.0)
HEMOGLOBIN: 10.3 g/dL — AB (ref 12.0–15.0)
LYMPHS ABS: 1.3 10*3/uL (ref 0.7–4.0)
Lymphocytes Relative: 12 %
MCH: 25.7 pg — AB (ref 26.0–34.0)
MCHC: 32 g/dL (ref 30.0–36.0)
MCV: 80.3 fL (ref 78.0–100.0)
Monocytes Absolute: 0.6 10*3/uL (ref 0.1–1.0)
Monocytes Relative: 6 %
NEUTROS PCT: 80 %
Neutro Abs: 8.3 10*3/uL — ABNORMAL HIGH (ref 1.7–7.7)
PLATELETS: 400 10*3/uL (ref 150–400)
RBC: 4.01 MIL/uL (ref 3.87–5.11)
RDW: 15.8 % — ABNORMAL HIGH (ref 11.5–15.5)
WBC: 10.3 10*3/uL (ref 4.0–10.5)

## 2015-10-02 LAB — I-STAT CHEM 8, ED
BUN: 17 mg/dL (ref 6–20)
CALCIUM ION: 1.16 mmol/L (ref 1.13–1.30)
CHLORIDE: 101 mmol/L (ref 101–111)
Creatinine, Ser: 0.9 mg/dL (ref 0.44–1.00)
Glucose, Bld: 88 mg/dL (ref 65–99)
HEMATOCRIT: 36 % (ref 36.0–46.0)
Hemoglobin: 12.2 g/dL (ref 12.0–15.0)
Potassium: 4.3 mmol/L (ref 3.5–5.1)
SODIUM: 135 mmol/L (ref 135–145)
TCO2: 24 mmol/L (ref 0–100)

## 2015-10-02 LAB — ETHANOL

## 2015-10-02 LAB — RAPID URINE DRUG SCREEN, HOSP PERFORMED
Amphetamines: NOT DETECTED
BARBITURATES: NOT DETECTED
Benzodiazepines: NOT DETECTED
Cocaine: NOT DETECTED
OPIATES: NOT DETECTED
TETRAHYDROCANNABINOL: NOT DETECTED

## 2015-10-02 LAB — CBG MONITORING, ED: Glucose-Capillary: 84 mg/dL (ref 65–99)

## 2015-10-02 LAB — PROTIME-INR
INR: 1.01 (ref 0.00–1.49)
Prothrombin Time: 13.5 seconds (ref 11.6–15.2)

## 2015-10-02 LAB — URINE MICROSCOPIC-ADD ON

## 2015-10-02 LAB — TROPONIN I: Troponin I: <0.03 ng/mL (ref <0.031)

## 2015-10-02 MED ORDER — GABAPENTIN 100 MG PO CAPS
200.0000 mg | ORAL_CAPSULE | Freq: Three times a day (TID) | ORAL | Status: DC
  Administered 2015-10-03 – 2015-10-05 (×8): 200 mg via ORAL
  Filled 2015-10-02 (×7): qty 2

## 2015-10-02 MED ORDER — GABAPENTIN 100 MG PO CAPS
100.0000 mg | ORAL_CAPSULE | Freq: Three times a day (TID) | ORAL | Status: DC
  Administered 2015-10-02: 100 mg via ORAL
  Filled 2015-10-02: qty 1

## 2015-10-02 MED ORDER — ASPIRIN 300 MG RE SUPP
300.0000 mg | Freq: Every day | RECTAL | Status: DC
  Administered 2015-10-02: 300 mg via RECTAL
  Filled 2015-10-02: qty 1

## 2015-10-02 MED ORDER — HEPARIN SODIUM (PORCINE) 5000 UNIT/ML IJ SOLN
5000.0000 [IU] | Freq: Three times a day (TID) | INTRAMUSCULAR | Status: DC
  Administered 2015-10-02 – 2015-10-05 (×6): 5000 [IU] via SUBCUTANEOUS
  Filled 2015-10-02 (×6): qty 1

## 2015-10-02 MED ORDER — GABAPENTIN 100 MG PO CAPS: 100.0000 mg | ORAL_CAPSULE | Freq: Three times a day (TID) | ORAL | Status: DC | PRN

## 2015-10-02 MED ORDER — STROKE: EARLY STAGES OF RECOVERY BOOK
Freq: Once | Status: AC
  Administered 2015-10-02: 17:00:00

## 2015-10-02 MED ORDER — FLUTICASONE PROPIONATE 50 MCG/ACT NA SUSP: 2.0000 | Freq: Every evening | NASAL | Status: DC | PRN

## 2015-10-02 MED ORDER — GABAPENTIN 100 MG PO CAPS
100.0000 mg | ORAL_CAPSULE | Freq: Every day | ORAL | Status: DC
  Administered 2015-10-02 – 2015-10-05 (×3): 100 mg via ORAL
  Filled 2015-10-02 (×3): qty 1

## 2015-10-02 MED ORDER — SODIUM CHLORIDE 0.9 % IV SOLN
100.0000 mL/h | INTRAVENOUS | Status: DC
  Administered 2015-10-02 – 2015-10-04 (×4): 100 mL/h via INTRAVENOUS

## 2015-10-02 MED ORDER — ACETAMINOPHEN 500 MG PO TABS
500.0000 mg | ORAL_TABLET | Freq: Three times a day (TID) | ORAL | Status: DC
  Administered 2015-10-02 – 2015-10-05 (×11): 500 mg via ORAL
  Filled 2015-10-02 (×11): qty 1

## 2015-10-02 MED ORDER — SODIUM CHLORIDE 0.9 % IV BOLUS (SEPSIS)
500.0000 mL | Freq: Once | INTRAVENOUS | Status: AC
  Administered 2015-10-02: 500 mL via INTRAVENOUS

## 2015-10-02 MED ORDER — GABAPENTIN 100 MG PO CAPS
100.0000 mg | ORAL_CAPSULE | Freq: Once | ORAL | Status: AC
  Administered 2015-10-02: 100 mg via ORAL
  Filled 2015-10-02: qty 1

## 2015-10-02 MED ORDER — GABAPENTIN 100 MG PO CAPS: 200.0000 mg | ORAL_CAPSULE | Freq: Three times a day (TID) | ORAL | Status: DC

## 2015-10-02 NOTE — ED Provider Notes (Signed)
CSN: [id number]     Arrival date & time 10/02/15  1203 History   First MD Initiated Contact with Patient 10/02/15 1205     Chief Complaint  Patient presents with  . Altered Mental Status     (Consider location/radiation/quality/duration/timing/severity/associated sxs/prior Treatment) Patient is a 82 y.o. female presenting with altered mental status. The history is provided by the EMS personnel and a relative. The history is limited by the condition of the patient.  Altered Mental Status Presenting symptoms: partial responsiveness   Presenting symptoms comment:  With right sided weakness and right sided gaze preference Severity:  Severe Most recent episode:  Today Episode history:  Unable to specify Duration: last seen normal 8am. Timing:  Constant Progression:  Unchanged Chronicity:  New Context: recent infection (reportedly recurrent UTIs)   Context: not a nursing home resident and not a recent illness   Associated symptoms: eye deviation   Associated symptoms: no abdominal pain, no difficulty breathing, no fever, no nausea, no rash and no vomiting     Past Medical History  Diagnosis Date  . Hypertension   . GERD (gastroesophageal reflux disease)   . H/O hiatal hernia   . Arthritis   . Syncope 07/13/2014  . Bladder infection, chronic     [name] 08/30/2014  . AV block, complete (HCC)   . Chest pain   . Decreased dorsalis pedis pulse   . Hammer toe   . Hyperlipidemia   . Onychogryposis of toenail   . Osteoporosis   . Scoliosis   . Status post placement of implantable loop recorder   . Syncope   . Uterine disorder   . Diaphragmatic hernia   . Hx of epistaxis   . Chest pain   . Iron deficiency anemia   . Chronic back pain   . Dementia   . Chronic kidney disease (CKD), stage III (moderate)   . Recurrent UTI (urinary tract infection)   . UTI (urinary tract infection) 04/2015   Past Surgical History  Procedure Laterality Date  . Joint replacement    . Cataract  extraction, bilateral Bilateral   . Total hip arthroplasty Right 10/11/2013    Procedure: TOTAL HIP ARTHROPLASTY;  Surgeon: Nestor Lewandowsky, MD;  Location: [facility] OR;  Service: Orthopedics;  Laterality: Right;  . Loop recorder implant    . Tubal ligation    . Total hip arthroplasty Left 05/2006    [name] 01/22/2011   Family History  Problem Relation Age of Onset  . Stroke Mother 5    died of stroke  . Hypertension Mother   . Heart disease Mother    Social History  Substance Use Topics  . Smoking status: Never Smoker   . Smokeless tobacco: Never Used  . Alcohol Use: No   OB History    No data available     Review of Systems  Constitutional: Negative for fever.  HENT: Negative.   Eyes: Negative for redness.  Respiratory: Negative for cough.   Cardiovascular: Negative for chest pain.  Gastrointestinal: Positive for constipation. Negative for nausea, vomiting, abdominal pain and diarrhea.  Genitourinary: Negative for hematuria and decreased urine volume.  Musculoskeletal: Negative.   Skin: Positive for wound (skin tear to left lateral lower leg). Negative for pallor and rash.  Neurological: Positive for facial asymmetry. Negative for syncope.      Allergies  Donepezil  Home Medications   Prior to Admission medications   Medication Sig Start Date End Date Taking? Authorizing Provider  acetaminophen (TYLENOL)  500 MG tablet Take 500 mg by mouth 4 (four) times daily - after meals and at bedtime. Take one tablet (500 mg) by mouth four times a day   Yes Historical Provider, MD  CRANBERRY PO Take 240 mLs by mouth 3 (three) times daily. Cranberry concentrate solution   Yes Historical Provider, MD  D-MANNOSE PO Take 1 tablet by mouth 2 (two) times daily.   Yes Historical Provider, MD  docusate sodium (COLACE) 100 MG capsule Take 1 capsule (100 mg total) by mouth 2 (two) times daily. 09/22/15  Yes Uvaldo Rising, MD  fluticasone (FLONASE) 50 MCG/ACT nasal spray Place 2 sprays into both  nostrils daily. Patient taking differently: Place 2 sprays into both nostrils at bedtime as needed for allergies.  12/05/14  Yes Uvaldo Rising, MD  gabapentin (NEURONTIN) 100 MG capsule Take 2 capsules (200 mg total) by mouth 3 (three) times daily. Take 1 capsule three times each day; may take 1 more if needed for breakthrough pain Patient taking differently: Take 200 mg by mouth 3 (three) times daily with meals. Take 200 mg three times daily with meals, 100 mg at bedtime 08/18/15  Yes Uvaldo Rising, MD  Menthol, Topical Analgesic, (BLUE-EMU MAXIMUM STRENGTH EX) Apply 1 application topically daily as needed (for arthritis pain).   Yes Historical Provider, MD  nystatin (MYCOSTATIN/NYSTOP) 100000 UNIT/GM POWD Apply to affected area twice daily. Patient taking differently: Apply 1 g topically 2 (two) times daily.  02/28/15  Yes Uvaldo Rising, MD  omeprazole (PRILOSEC) 20 MG capsule Take one tablet (20 mg) by mouth every morning   Yes Historical Provider, MD  Probiotic Product (ALIGN) 4 MG CAPS Take 4 mg by mouth daily.   Yes Historical Provider, MD  Wheat Dextrin (BENEFIBER) POWD Take 5-10 mg by mouth See admin instructions. Take 5mg  daily, and if needed for constipation take another 5mg .   Yes Historical Provider, MD   BP 147/91 mmHg  Pulse 128  Temp(Src) 98.2 F (36.8 C) (Oral)  Resp 18  Wt 59.3 kg  SpO2 100% Physical Exam  Constitutional: She appears cachectic. She is cooperative. She appears ill.  Chronically ill appearing elderly woman  HENT:  Head: Normocephalic and atraumatic.  Mouth/Throat: No oropharyngeal exudate.  Eyes: Conjunctivae are normal. Pupils are equal, round, and reactive to light.  Eyes deviated to right bilaterally  Neck: Normal range of motion. Neck supple.  Cardiovascular: Normal rate, regular rhythm, normal heart sounds and intact distal pulses.   Pulmonary/Chest: Effort normal. No respiratory distress. She has no wheezes. She has no rales.  Abdominal: Soft. She  exhibits no distension. There is no tenderness.  Musculoskeletal: She exhibits no edema or tenderness.  Neurological: She is disoriented. A cranial nerve deficit (bilateral eyes deviated to right but will cross midline with prompting.) is present. GCS eye subscore is 4. GCS verbal subscore is 1. GCS motor subscore is 6.  Decreased strength bilateral right ext with increased tone and rigidity in RUE and bilateral LE  Skin: Skin is warm and dry. No rash noted. No erythema.  Nursing note and vitals reviewed.   ED Course  Procedures (including critical care time) Labs Review Labs Reviewed  CBC WITH DIFFERENTIAL/PLATELET - Abnormal; Notable for the following:    Hemoglobin 10.3 (*)    HCT 32.2 (*)    MCH 25.7 (*)    RDW 15.8 (*)    Neutro Abs 8.3 (*)    All other components within normal limits  COMPREHENSIVE METABOLIC  PANEL - Abnormal; Notable for the following:    ALT 11 (*)    GFR calc non Af Amer 56 (*)    All other components within normal limits  URINALYSIS, ROUTINE W REFLEX MICROSCOPIC (NOT AT [facility]) - Abnormal; Notable for the following:    Color, Urine STRAW (*)    APPearance TURBID (*)    Hgb urine dipstick MODERATE (*)    Nitrite POSITIVE (*)    Leukocytes, UA LARGE (*)    All other components within normal limits  URINE MICROSCOPIC-ADD ON - Abnormal; Notable for the following:    Squamous Epithelial / LPF 0-5 (*)    Bacteria, UA MANY (*)    All other components within normal limits  CULTURE, BLOOD (ROUTINE X 2)  CULTURE, BLOOD (ROUTINE X 2)  URINE CULTURE  GRAM STAIN  PROTIME-INR  URINE RAPID DRUG SCREEN, HOSP PERFORMED  TROPONIN I  APTT  ETHANOL  URINALYSIS, ROUTINE W REFLEX MICROSCOPIC (NOT AT [facility])  HEMOGLOBIN A1C  LIPID PANEL  I-STAT CHEM 8, ED  I-STAT CG4 LACTIC ACID, ED  CBG MONITORING, ED    Imaging Review Ct Head Wo Contrast  10/02/2015  ADDENDUM REPORT: 10/02/2015 13:00 ADDENDUM: The original report was by Dr. Gaylyn Rong. The following  addendum is by Dr. Gaylyn Rong: Critical Value/emergent results were called by telephone at the time of interpretation on 10/02/2015 at 12:45 pm to Dr. Mosie Epstein , who verbally acknowledged these results. Electronically Signed   By: Gaylyn Rong M.D.   On: 10/02/2015 13:00  10/02/2015  CLINICAL DATA:  Right-sided gaze. Right arm weakness. History of dementia. Newly nonverbal. EXAM: CT HEAD WITHOUT CONTRAST TECHNIQUE: Contiguous axial images were obtained from the base of the skull through the vertex without intravenous contrast. COMPARISON:  09/05/2015 FINDINGS: Stable hypodensities in the thalami and left lentiform nucleus with bilateral globus pallidus calcifications and confluent periventricular white matter hypodensities similar to prior, compatible with chronic microvascular white matter disease and chronic lacunar infarcts. There is loss of gray-white differentiation in the right occipital lobe suggesting a PCA infarct. This is new compared to the prior exam. No additional new significant finding is observed. No intracranial hemorrhage or mass lesion. Chronic bilateral maxillary sinusitis noted with some frothy fluid in the right maxillary sinus. Chronic ethmoid sinusitis noted with fluid level in the right sphenoid sinus and frothy fluid in the left sphenoid sinus. Chronic bifrontal sinusitis. There is atherosclerotic calcification of the cavernous carotid arteries bilaterally. IMPRESSION: 1. Loss of gray-white differentiation in the right occipital lobe suspicious for an acute/subacute right PCA distribution infarct. This was not present on 09/05/2015. 2. Advanced chronic ischemic microvascular white matter disease with scattered lacunar infarcts in the left lentiform nucleus and thalami. 3. Acute on chronic paranasal sinusitis as detailed above. Electronically Signed: By: Gaylyn Rong M.D. On: 10/02/2015 12:43   Mr Maxine Glenn Head Wo Contrast  10/02/2015  CLINICAL DATA:  The patient has been  nonverbal several days. Advanced dementia. The patient has been cleaning and her chair to the right. EXAM: MRI HEAD WITHOUT CONTRAST MRA HEAD WITHOUT CONTRAST TECHNIQUE: Multiplanar, multiecho pulse sequences of the brain and surrounding structures were obtained without intravenous contrast. Angiographic images of the head were obtained using MRA technique without contrast. COMPARISON:  CT head without contrast 10/02/2015. MRI brain 12/19/2014. FINDINGS: MRI HEAD FINDINGS The diffusion-weighted images demonstrate an acute/subacute nonhemorrhagic infarct involving the posterior right temporal lobe and medial a occipital lobe in a PCA distribution. Do there is also focal restricted  diffusion involving the posterior inferior right ACA territory adjacent to the left lateral ventricle. Atrophy and advanced white matter disease is present bilaterally. There are remote infarcts involving the basal ganglia bilaterally. White matter changes extend into the brainstem. Other remote lacunar infarcts are again seen in the cerebellum. Flow is present in the major intracranial arteries. The patient is status post bilateral lens replacements. The globes and orbits are otherwise intact. Fluid levels are present in the maxillary sinuses bilaterally. There is scattered opacification of ethmoid air cells. Mucosal thickening is present in the frontal sinuses bilaterally. There is fluid in both sphenoid sinuses. The mastoid air cells are clear. Exaggerated cervical lordosis is present. There is slight anterolisthesis at C4-5 in retrolisthesis at C5-6. MRA HEAD FINDINGS The internal carotid arteries demonstrates mild irregularity throughout the cavernous segments bilaterally without a significant stenosis relative to the more distal vessel. 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 There is moderate stenosis in the left A1 segment. There is a high-grade stenosis or occlusion of the left A2 segment. Moderate stenosis is present in the right  A2 approximately 9 mm from the anterior communicating artery. There is mild narrowing of the distal M1 segments bilaterally. Moderate small vessel attenuation is present bilaterally with high-grade distal stenoses on the right. The The left vertebral artery is the dominant vessel. The left PICA is visualized and normal. The right PICA is not visualized. The basilar artery is within normal limits. Both posterior cerebral arteries originate from the basilar tip. There is a high-grade focal stenosis with signal loss in the proximal left P2 segment and a moderate stenosis on the right. A high-grade distal right P2 stenosis is present without visualization of the medial branch. This corresponds with the area of acute infarction. A high-grade stenosis is present at the left PCA bifurcation as well. Both distal branch vessels are visualized. IMPRESSION: 1. Acute/subacute nonhemorrhagic infarct involving the right posterior temporal lobe and inferior right occipital lobe. 2. High-grade stenosis or occlusion of the distal right P2 segment with nonvisualization of the medial PCA branch corresponding to the acute infarct. 3. Tandem high-grade stenoses of the left posterior cerebral artery. 4. Acute/subacute nonhemorrhagic infarct in the left ACA territory. 5. High-grade stenosis or occlusion of the left ACA within the left A2 segment. 6. Extensive medium and distal small vessel disease. 7. Advanced atrophy and diffuse white matter disease corresponding with the extensive small vessel disease. Electronically Signed   By: Marin Roberts M.D.   On: 10/02/2015 14:45   Mr Brain Wo Contrast  10/02/2015  CLINICAL DATA:  The patient has been nonverbal several days. Advanced dementia. The patient has been cleaning and her chair to the right. EXAM: MRI HEAD WITHOUT CONTRAST MRA HEAD WITHOUT CONTRAST TECHNIQUE: Multiplanar, multiecho pulse sequences of the brain and surrounding structures were obtained without intravenous  contrast. Angiographic images of the head were obtained using MRA technique without contrast. COMPARISON:  CT head without contrast 10/02/2015. MRI brain 12/19/2014. FINDINGS: MRI HEAD FINDINGS The diffusion-weighted images demonstrate an acute/subacute nonhemorrhagic infarct involving the posterior right temporal lobe and medial a occipital lobe in a PCA distribution. Do there is also focal restricted diffusion involving the posterior inferior right ACA territory adjacent to the left lateral ventricle. Atrophy and advanced white matter disease is present bilaterally. There are remote infarcts involving the basal ganglia bilaterally. White matter changes extend into the brainstem. Other remote lacunar infarcts are again seen in the cerebellum. Flow is present in the major intracranial arteries.  The patient is status post bilateral lens replacements. The globes and orbits are otherwise intact. Fluid levels are present in the maxillary sinuses bilaterally. There is scattered opacification of ethmoid air cells. Mucosal thickening is present in the frontal sinuses bilaterally. There is fluid in both sphenoid sinuses. The mastoid air cells are clear. Exaggerated cervical lordosis is present. There is slight anterolisthesis at C4-5 in retrolisthesis at C5-6. MRA HEAD FINDINGS The internal carotid arteries demonstrates mild irregularity throughout the cavernous segments bilaterally without a significant stenosis relative to the more distal vessel. 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 There is moderate stenosis in the left A1 segment. There is a high-grade stenosis or occlusion of the left A2 segment. Moderate stenosis is present in the right A2 approximately 9 mm from the anterior communicating artery. There is mild narrowing of the distal M1 segments bilaterally. Moderate small vessel attenuation is present bilaterally with high-grade distal stenoses on the right. The The left vertebral artery is the dominant vessel.  The left PICA is visualized and normal. The right PICA is not visualized. The basilar artery is within normal limits. Both posterior cerebral arteries originate from the basilar tip. There is a high-grade focal stenosis with signal loss in the proximal left P2 segment and a moderate stenosis on the right. A high-grade distal right P2 stenosis is present without visualization of the medial branch. This corresponds with the area of acute infarction. A high-grade stenosis is present at the left PCA bifurcation as well. Both distal branch vessels are visualized. IMPRESSION: 1. Acute/subacute nonhemorrhagic infarct involving the right posterior temporal lobe and inferior right occipital lobe. 2. High-grade stenosis or occlusion of the distal right P2 segment with nonvisualization of the medial PCA branch corresponding to the acute infarct. 3. Tandem high-grade stenoses of the left posterior cerebral artery. 4. Acute/subacute nonhemorrhagic infarct in the left ACA territory. 5. High-grade stenosis or occlusion of the left ACA within the left A2 segment. 6. Extensive medium and distal small vessel disease. 7. Advanced atrophy and diffuse white matter disease corresponding with the extensive small vessel disease. Electronically Signed   By: Marin Roberts M.D.   On: 10/02/2015 14:45   Dg Chest Port 1 View  10/02/2015  CLINICAL DATA:  Altered mental status.  Hypertension. EXAM: PORTABLE CHEST 1 VIEW COMPARISON:  07/16/2015 FINDINGS: Cardiomediastinal silhouette is normal. Mediastinal contours appear intact. There is chronic elevation of the right hemidiaphragm. There is no evidence of focal airspace consolidation, pleural effusion or pneumothorax. Osseous structures are without acute abnormality. Soft tissues are grossly normal. IMPRESSION: No active disease. Electronically Signed   By: Ted Mcalpine M.D.   On: 10/02/2015 13:06   I have personally reviewed and evaluated these images and lab results as part  of my medical decision-making.   EKG Interpretation   Date/Time:  Monday October 02 2015 13:17:48 EST Ventricular Rate:  119 PR Interval:    QRS Duration: 132 QT Interval:  307 QTC Calculation: 432 R Axis:   135 Text Interpretation:  AV block, complete (third degree) IVCD, consider  atypical RBBB Probable lateral infarct, old Anteroseptal infarct, age  indeterminate Artifact in lead(s) I II III aVR aVL aVF V1 V2 V3 V4 V5 V6  regular rhythm but with substantial artefact. Abnormal ekg Confirmed by  Gerhard Munch  MD [phone]) on 10/02/2015 2:14:05 PM      MDM   Final diagnoses:  Altered mental status, unspecified altered mental status type  UTI (lower urinary tract infection)  Patient is a 82 year old female presents with concerns of sudden onset of altered mental status and right-sided weakness with right-sided gaze preference. Last seen normal around 8 AM when she had a difficult bowel movement at which point her symptoms started. Upon arrival, VSS, pt nonverbal with right sided gaze and right sided weakness. Code stroke initiated and neurology consuled. CT of the brain shows right occipital lobe infarct  in right PCA distribution. Patient is not a TPA candidate per neurology. Patient also appears to have a UTI but no report of fever or dysuria at this time. No leukocytosis or fever. Patient will be admitted to family medicine for further management and evaluation with neurology consulted for stroke  Marijean Niemann, MD 10/02/15 Alexis Goodell, MD 10/07/15 [phone]

## 2015-10-02 NOTE — Progress Notes (Signed)
SLP Cancellation Note  Patient Details Name: Victoria Lewis MRN: [id number] DOB: [birth year]   Cancelled treatment:       Reason Eval/Treat Not Completed: Other (comment) Discussed pt with MD, who reports that swallow evaluation is no longer warranted as family has chosen to accept the risks of aspiration and allow her to continue to eat baseline diet, as they have chosen to do in the past. SLP to sign off - please re-order if we can be of assistance.   Maxcine Ham, M.A. CCC-SLP [phone]  Maxcine Ham 10/02/2015, 4:48 PM

## 2015-10-02 NOTE — ED Notes (Signed)
[facility]- pt here for altered mental status, pt has a hx of dementia but is normally verbal. Pt following some commands but non-verbal at this time. Unknown LSN. Hypertensive with EMS.

## 2015-10-02 NOTE — Code Documentation (Signed)
82yo female arriving to [facility] via [facility] at 1203.  Patient was LKW at 0800 when she went to the BR.  Per family she was staring to the right, not responding and slumped to the right.  EMS reports patient was in the car on their arrival to the scene.  Patient transported to [facility] where a code stroke was activated.  Patient to CT.  Stroke team to the bedside.  NIHSS 17, see documentation for details and code stroke times.  Patient starting to respond, did state her name, follows commands intermittently.  Patient continues to have right gaze preference.  Patient is outside the window for treatment with tPA.  Family at the bedside and reports that patient has dementia and requires assistance with ADLs, only ambulates a few steps at times.  Patient is not a candidate for endovascular intervention.  Patient to be admitted for work-up.  Bedside handoff with ED RN Nehemiah Settle.

## 2015-10-02 NOTE — Progress Notes (Signed)
Patient arrived to 42M06. Daughter at bedside. Patient alert and denies any pain. Patient place on tele box 5M06. Bed alarm on and call bell within reach. Will continue to monitor.

## 2015-10-02 NOTE — H&P (Signed)
Family Medicine Teaching [facility] Admission History and Physical Service Pager: [phone]  [name] Medical record number: [id number] Date of birth: 10-Aug-1928 Age: 82 y.o. Gender: female  Primary Care Provider: Uvaldo Rising, MD Consultants: neurology Code Status: FULL (discussed on admission)  Chief Complaint: possible stroke  Assessment and Plan: Victoria Lewis is a 82 y.o. female presenting with left sided neglect . PMH is significant for recurrent h/o CVA Right MCA, DVT, PFO, UTI, CKD stage III, dementia, complete heart block.  # L sided neglect: CT head suggestive of acute/subacute right PCA infarct.  MRI/MRA:  Acute/subacute nonhemorrhagic infarct involving the right posterior temporal lobe and inferior right occipital lobe. High-grade stenosis or occlusion of the distal right P2 segment with nonvisualization of the medial PCA branch corresponding to the acute infarct. Tandem high-grade stenoses of the left posterior cerebral artery. Acute/subacute nonhemorrhagic infarct in the left ACA territory. High-grade stenosis or occlusion of the left ACA within the left A2 segment. Extensive medium and distal small vessel disease. Advanced atrophy and diffuse white matter disease corresponding with the extensive small vessel disease. Last known well was before 8am today.  Patient with h/o stroke in 12/2014 secondary to DVT/PFO.  She was treated with 6 months of Eliquis.  Lipid panel total 291, LDL 211.  12/2014 A1c 5.7. Family elected to hold statin in the past given degree of vascular dementia - Admit to FPTS under Dr McDiarmid - Telemetry - Vitals per floor protocol - c/s neurology, appreciate recs - EEG - ASA 325 per rectum - Neuro checks q2 - Advised family PT/OT evaluations would be appropriate for her. The family declined at this time. Risk and benefits were discussed. - NPO until speech evaluation. If she fails, will get SLP to perform swallow  evaluation - Permissive HTN to 220/120 - Continue gabapentin, tylenol once swallow evaluation completed  # Asymptomatic bacteruria: UA with large leuks, positive nitrites, mod hgb, many bacteria.  Last UTI 12/16 with E coli and Klebsiella oxytoca sensitive to cephalosporins and nitrofurantoin.  Afebrile and no leukocytosis so low suspicion for sepsis. - Urine culture sent - Blood cultures sent - Consider starting Rocephin if she starts having temperatures > 99.  # CKD stage III: Cr 0.91 on admission - Avoid nephrotoxic agents  # Complete heart block: has [employer] loop recorder placed 10/2013.  EKG with moderate artifacts but seemingly sinus without ischemia. - Cardiac monitoring  # Dementia: dependent of ADLs  - At increased risk of delirium while hospitalized - Will continue to monitor.    FEN/GI: NPO until speech eval Prophylaxis: scds  Disposition: Admit to FPTS under Dr McDiarmid  History of Present Illness:  Victoria Lewis is a 82 y.o. female presenting with left sided neglect and asymptomatic bacturia. She was normal this morning when she woke up around 7am. She was taken to the toilet around 8am and then passed out. Her daughter found her slumped over and gazing to the right. This often occurs when she has bowel movements, but this morning it occurred before she had a bowel movement. They tried to cool her down with cool rags. She then had two very large bowel movements. She ate normally this am. Around 10am, she continued not to act right. They have noted strong smelling urine today. They were going to bring her to the office for evaluation but she was so weak, that they elected to have the ambulance bring her to the ED. Her temperature was 98.80F this  am, which is a little higher than her baseline. She has not had any vomiting, diarrhea, hematuria.  She has a Comptroller that helps from 8-12 daily.  She is non-mobile and spends most of the day sitting up in a chair. She has not been  talking much since last month. This appears to be her new baseline. Patient at baseline is weak/ deconditioned. She is completely dependent on her caretakers for ADLs.  HCPOA: Celest Reitz [phone], Brother [id number] Gabryel Talamo [phone]  Review Of Systems: Per HPI with the following additions: none Otherwise the remainder of the systems were negative.  Patient Active Problem List   Diagnosis Date Noted  . UTI (lower urinary tract infection) 10/02/2015  . Left-sided neglect 10/02/2015  . Post-viral cough syndrome 09/26/2015  . Constipation 09/22/2015  . Skin abrasion 09/15/2015  . Dementia with behavioral disturbance   . Delirium due to general medical condition   . UTI (urinary tract infection) with pyuria 09/04/2015  . Altered mental status 09/04/2015  . Syncope 09/04/2015  . Lactic acidosis 09/04/2015  . Pain, intermittent 08/18/2015  . Right arm pain 07/28/2015  . Right ankle instability 05/19/2015  . Asystole (HCC)   . Near syncope   . Abnormal urine odor 04/24/2015  . PFO (patent foramen ovale) 03/23/2015  . Cerebral infarction due to embolism of right middle cerebral artery (HCC) 03/23/2015  . Paradoxical embolism (HCC) 03/23/2015  . Enterococcus UTI 03/07/2015  . Intertrigo 01/24/2015  . Skin lesion of face 01/24/2015  . DVT (deep venous thrombosis) (HCC) 12/27/2014  . Cough 12/27/2014  . Stroke with cerebral ischemia (HCC)   . HLD (hyperlipidemia)   . Essential hypertension   . Focal motor deficit 12/19/2014  . Low back pain radiating to left leg 11/01/2014  . Complete heart block (HCC) 07/14/2014  . Dementia   . Chronic kidney disease (CKD), stage III (moderate) 04/11/2014  . History of urinary anomaly 03/04/2014  . Recurrent UTI 02/28/2014    Past Medical History: Past Medical History  Diagnosis Date  . Hypertension   . GERD (gastroesophageal reflux disease)   . H/O hiatal hernia   . Arthritis   . Syncope 07/13/2014  . Bladder  infection, chronic     [name] 08/30/2014  . AV block, complete (HCC)   . Chest pain   . Decreased dorsalis pedis pulse   . Hammer toe   . Hyperlipidemia   . Onychogryposis of toenail   . Osteoporosis   . Scoliosis   . Status post placement of implantable loop recorder   . Syncope   . Uterine disorder   . Diaphragmatic hernia   . Hx of epistaxis   . Chest pain   . Iron deficiency anemia   . Chronic back pain   . Dementia   . Chronic kidney disease (CKD), stage III (moderate)   . Recurrent UTI (urinary tract infection)   . UTI (urinary tract infection) 04/2015    Past Surgical History: Past Surgical History  Procedure Laterality Date  . Joint replacement    . Cataract extraction, bilateral Bilateral   . Total hip arthroplasty Right 10/11/2013    Procedure: TOTAL HIP ARTHROPLASTY;  Surgeon: Nestor Lewandowsky, MD;  Location: [facility] OR;  Service: Orthopedics;  Laterality: Right;  . Loop recorder implant    . Tubal ligation    . Total hip arthroplasty Left 05/2006    [name] 01/22/2011    Social History: Social History  Substance Use Topics  . Smoking  status: Never Smoker   . Smokeless tobacco: Never Used  . Alcohol Use: No    Family History: Family History  Problem Relation Age of Onset  . Stroke Mother 71    died of stroke  . Hypertension Mother   . Heart disease Mother     Allergies and Medications: Allergies  Allergen Reactions  . Donepezil Other (See Comments)    Lethargic, couldn't eat or talk, passed out all the time   No current facility-administered medications on file prior to encounter.   Current Outpatient Prescriptions on File Prior to Encounter  Medication Sig Dispense Refill  . acetaminophen (TYLENOL) 500 MG tablet Take 500 mg by mouth 4 (four) times daily - after meals and at bedtime. Take one tablet (500 mg) by mouth four times a day    . CRANBERRY PO Take 240 mLs by mouth 3 (three) times daily. Cranberry concentrate solution    . D-MANNOSE PO Take 1  tablet by mouth 2 (two) times daily.    Marland Kitchen docusate sodium (COLACE) 100 MG capsule Take 1 capsule (100 mg total) by mouth 2 (two) times daily. 60 capsule 2  . fluticasone (FLONASE) 50 MCG/ACT nasal spray Place 2 sprays into both nostrils daily. (Patient taking differently: Place 2 sprays into both nostrils at bedtime as needed for allergies. ) 16 g 6  . gabapentin (NEURONTIN) 100 MG capsule Take 2 capsules (200 mg total) by mouth 3 (three) times daily. Take 1 capsule three times each day; may take 1 more if needed for breakthrough pain 180 capsule 2  . nystatin (MYCOSTATIN/NYSTOP) 100000 UNIT/GM POWD Apply to affected area twice daily. (Patient taking differently: Apply 1 g topically 2 (two) times daily. ) 60 g 1  . omeprazole (PRILOSEC) 20 MG capsule Take one tablet (20 mg) by mouth every morning    . Probiotic Product (ALIGN) 4 MG CAPS Take 4 mg by mouth daily.    . Wheat Dextrin (BENEFIBER) POWD Take 5-10 mg by mouth See admin instructions. Take  daily, and if needed for constipation take another .      Objective: BP 169/92 mmHg  Pulse 124  Temp(Src) 97.4 F (36.3 C) (Rectal)  Resp 21  SpO2 100% Exam: General: Frail, elderly woman laying in bed, mostly looking to the right but occasionally looking straight ahead, in NAD Eyes: Pinpoint pupils that are not reactive to light, able to look to the right and straight ahead but does not look to the left. ENTM: MMM Neck: Supple, no lymphadenopathy Cardiovascular: Tachycardic, regular rhythm, no murmurs, no carotid bruits, 1+ DP pulses bilaterally Respiratory: CTAB, no wheezes, normal work of breathing Abdomen: +BS, soft, non-tender, non-distended MSK: Contracture of right foot, no lower extremity edema Skin: No rashes Neuro: Awake, alert, will not look to the left, 5/5 grip strength bilaterally, able to wiggle her toes Psych: Appropriate  Labs and Imaging: CBC BMET   Recent Labs Lab 10/02/15 1307  WBC 10.3  HGB 10.3*  HCT 32.2*   PLT 400    Recent Labs Lab 10/02/15 1307  NA 136  K 4.3  CL 101  CO2 22  BUN 13  CREATININE 0.91  GLUCOSE 92  CALCIUM 9.5     Ct Head Wo Contrast  10/02/2015  ADDENDUM REPORT: 10/02/2015 13:00 ADDENDUM: The original report was by Dr. Gaylyn Rong. The following addendum is by Dr. Gaylyn Rong: Critical Value/emergent results were called by telephone at the time of interpretation on 10/02/2015 at 12:45 pm to Dr. Mosie Epstein ,  who verbally acknowledged these results. Electronically Signed   By: Gaylyn Rong M.D.   On: 10/02/2015 13:00  10/02/2015  CLINICAL DATA:  Right-sided gaze. Right arm weakness. History of dementia. Newly nonverbal. EXAM: CT HEAD WITHOUT CONTRAST TECHNIQUE: Contiguous axial images were obtained from the base of the skull through the vertex without intravenous contrast. COMPARISON:  09/05/2015 FINDINGS: Stable hypodensities in the thalami and left lentiform nucleus with bilateral globus pallidus calcifications and confluent periventricular white matter hypodensities similar to prior, compatible with chronic microvascular white matter disease and chronic lacunar infarcts. There is loss of gray-white differentiation in the right occipital lobe suggesting a PCA infarct. This is new compared to the prior exam. No additional new significant finding is observed. No intracranial hemorrhage or mass lesion. Chronic bilateral maxillary sinusitis noted with some frothy fluid in the right maxillary sinus. Chronic ethmoid sinusitis noted with fluid level in the right sphenoid sinus and frothy fluid in the left sphenoid sinus. Chronic bifrontal sinusitis. There is atherosclerotic calcification of the cavernous carotid arteries bilaterally. IMPRESSION: 1. Loss of gray-white differentiation in the right occipital lobe suspicious for an acute/subacute right PCA distribution infarct. This was not present on 09/05/2015. 2. Advanced chronic ischemic microvascular white matter disease  with scattered lacunar infarcts in the left lentiform nucleus and thalami. 3. Acute on chronic paranasal sinusitis as detailed above. Electronically Signed: By: Gaylyn Rong M.D. On: 10/02/2015 12:43   Mr Brain Wo Contrast  10/02/2015  CLINICAL DATA:  The patient has been nonverbal several days. Advanced dementia. The patient has been cleaning and her chair to the right. EXAM: MRI HEAD WITHOUT CONTRAST MRA HEAD WITHOUT CONTRAST TECHNIQUE: Multiplanar, multiecho pulse sequences of the brain and surrounding structures were obtained without intravenous contrast. Angiographic images of the head were obtained using MRA technique without contrast. COMPARISON:  CT head without contrast 10/02/2015. MRI brain 12/19/2014. FINDINGS: MRI HEAD FINDINGS The diffusion-weighted images demonstrate an acute/subacute nonhemorrhagic infarct involving the posterior right temporal lobe and medial a occipital lobe in a PCA distribution. Do there is also focal restricted diffusion involving the posterior inferior right ACA territory adjacent to the left lateral ventricle. Atrophy and advanced white matter disease is present bilaterally. There are remote infarcts involving the basal ganglia bilaterally. White matter changes extend into the brainstem. Other remote lacunar infarcts are again seen in the cerebellum. Flow is present in the major intracranial arteries. The patient is status post bilateral lens replacements. The globes and orbits are otherwise intact. Fluid levels are present in the maxillary sinuses bilaterally. There is scattered opacification of ethmoid air cells. Mucosal thickening is present in the frontal sinuses bilaterally. There is fluid in both sphenoid sinuses. The mastoid air cells are clear. Exaggerated cervical lordosis is present. There is slight anterolisthesis at C4-5 in retrolisthesis at C5-6. MRA HEAD FINDINGS The internal carotid arteries demonstrates mild irregularity throughout the cavernous  segments bilaterally without a significant stenosis relative to the more distal vessel. 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 There is moderate stenosis in the left A1 segment. There is a high-grade stenosis or occlusion of the left A2 segment. Moderate stenosis is present in the right A2 approximately 9 mm from the anterior communicating artery. There is mild narrowing of the distal M1 segments bilaterally. Moderate small vessel attenuation is present bilaterally with high-grade distal stenoses on the right. The The left vertebral artery is the dominant vessel. The left PICA is visualized and normal. The right PICA is not visualized. The basilar  artery is within normal limits. Both posterior cerebral arteries originate from the basilar tip. There is a high-grade focal stenosis with signal loss in the proximal left P2 segment and a moderate stenosis on the right. A high-grade distal right P2 stenosis is present without visualization of the medial branch. This corresponds with the area of acute infarction. A high-grade stenosis is present at the left PCA bifurcation as well. Both distal branch vessels are visualized. IMPRESSION: 1. Acute/subacute nonhemorrhagic infarct involving the right posterior temporal lobe and inferior right occipital lobe. 2. High-grade stenosis or occlusion of the distal right P2 segment with nonvisualization of the medial PCA branch corresponding to the acute infarct. 3. Tandem high-grade stenoses of the left posterior cerebral artery. 4. Acute/subacute nonhemorrhagic infarct in the left ACA territory. 5. High-grade stenosis or occlusion of the left ACA within the left A2 segment. 6. Extensive medium and distal small vessel disease. 7. Advanced atrophy and diffuse white matter disease corresponding with the extensive small vessel disease. Electronically Signed   By: Marin Roberts M.D.   On: 10/02/2015 14:45   Dg Chest Port 1 View  10/02/2015  CLINICAL DATA:  Altered mental  status.  Hypertension. EXAM: PORTABLE CHEST 1 VIEW COMPARISON:  07/16/2015 FINDINGS: Cardiomediastinal silhouette is normal. Mediastinal contours appear intact. There is chronic elevation of the right hemidiaphragm. There is no evidence of focal airspace consolidation, pleural effusion or pneumothorax. Osseous structures are without acute abnormality. Soft tissues are grossly normal. IMPRESSION: No active disease. Electronically Signed   By: Ted Mcalpine M.D.   On: 10/02/2015 13:06   Campbell Stall, MD 10/02/2015, 3:46 PM PGY-1, [facility] Family Medicine FPTS Intern pager: [phone], text pages welcome

## 2015-10-02 NOTE — ED Notes (Signed)
[employer] reports no cardiac events since January 8th.

## 2015-10-02 NOTE — Consult Note (Signed)
Requesting Physician:  ER physician     Reason fo consultation:  Stroke code  HPI:                                                                                                                                         Victoria Lewis is an 82 y.o. female patient who was brought in by the EMS at unknown. Lives with her husband at home . Patient unable to provide any history. Her son and daughter-in-law are at bedside who provided the history. At baseline, she has advanced dementia and completely dependent for her ADLs. She is reportedly not speaking well for several days per family, nonambulatory, family reported stiffness in all limbs. Need help with ADLs.  This morning around 8:00 when she is sitting on a poor chair, family noticed that she slumped over to the right side, and since then appear to be looking only to the right and neck lifting the left side. There are not clear if she had any new weakness in arms and legs given her poor baseline function.  History is obtained from: Son and daughter-in-law   Date last known well:  10/02/2015 Time last known well:    8 AM tPA Given: No: Outside the IV TPA time window when seen in the ER, at 12:30 pm. (Stroke code paged at 12.26 pm).    Stroke Risk Factors - hypertension  Past Medical History  Diagnosis Date  . Hypertension   . GERD (gastroesophageal reflux disease)   . H/O hiatal hernia   . Arthritis   . Syncope 07/13/2014  . Bladder infection, chronic     [name] 08/30/2014  . AV block, complete (HCC)   . Chest pain   . Decreased dorsalis pedis pulse   . Hammer toe   . Hyperlipidemia   . Onychogryposis of toenail   . Osteoporosis   . Scoliosis   . Status post placement of implantable loop recorder   . Syncope   . Uterine disorder   . Diaphragmatic hernia   . Hx of epistaxis   . Chest pain   . Iron deficiency anemia   . Chronic back pain   . Dementia   . Chronic kidney disease (CKD), stage III (moderate)   . Recurrent UTI  (urinary tract infection)   . UTI (urinary tract infection) 04/2015    Past Surgical History  Procedure Laterality Date  . Joint replacement    . Cataract extraction, bilateral Bilateral   . Total hip arthroplasty Right 10/11/2013    Procedure: TOTAL HIP ARTHROPLASTY;  Surgeon: Nestor Lewandowsky, MD;  Location: [facility] OR;  Service: Orthopedics;  Laterality: Right;  . Loop recorder implant    . Tubal ligation    . Total hip arthroplasty Left 05/2006    [name] 01/22/2011    Family History  Problem Relation Age of Onset  . Stroke Mother 74  died of stroke  . Hypertension Mother   . Heart disease Mother     Social History:  reports that she has never smoked. She has never used smokeless tobacco. She reports that she does not drink alcohol or use illicit drugs.  Allergies: No Known Allergies  Medications:                                                                                                                         Current facility-administered medications:  .  sodium chloride 0.9 % bolus 500 mL, 500 mL, Intravenous, Once **FOLLOWED BY** 0.9 %  sodium chloride infusion, 100 mL/hr, Intravenous, Continuous, Gerhard Munch, MD .  aspirin suppository 300 mg, 300 mg, Rectal, Daily, Paymon Rosensteel Daniel Nones, MD  Current outpatient prescriptions:  .  acetaminophen (TYLENOL) 500 MG tablet, Take 500 mg by mouth every 6 (six) hours as needed for mild pain. Take one tablet (500 mg) by mouth four times a day, Disp: , Rfl:  .  CRANBERRY PO, Take 240 mLs by mouth 3 (three) times daily. Cranberry concentrate solution, Disp: , Rfl:  .  D-MANNOSE PO, Take 1 tablet by mouth 2 (two) times daily., Disp: , Rfl:  .  docusate sodium (COLACE) 100 MG capsule, Take 1 capsule (100 mg total) by mouth 2 (two) times daily., Disp: 60 capsule, Rfl: 2 .  fluticasone (FLONASE) 50 MCG/ACT nasal spray, Place 2 sprays into both nostrils daily. (Patient taking differently: Place 2 sprays into both nostrils daily as  needed for allergies. ), Disp: 16 g, Rfl: 6 .  gabapentin (NEURONTIN) 100 MG capsule, Take 2 capsules (200 mg total) by mouth 3 (three) times daily. Take 1 capsule three times each day; may take 1 more if needed for breakthrough pain, Disp: 180 capsule, Rfl: 2 .  nystatin (MYCOSTATIN/NYSTOP) 100000 UNIT/GM POWD, Apply to affected area twice daily. (Patient taking differently: Apply 1 g topically 2 (two) times daily as needed (Fungal infection). ), Disp: 60 g, Rfl: 1 .  omeprazole (PRILOSEC) 20 MG capsule, Take one tablet (20 mg) by mouth every morning, Disp: , Rfl:  .  Probiotic Product (ALIGN) 4 MG CAPS, Take 4 mg by mouth daily., Disp: , Rfl:  .  Wheat Dextrin (BENEFIBER) POWD, Take 5-10 mg by mouth See admin instructions. Take  daily, and if needed for constipation take another ., Disp: , Rfl:    ROS:  Unable to obtain review of systems due to advanced dementia and mental status changes   Neurologic Examination:                                                                                                      Blood pressure 184/111, pulse 118, temperature 97.4 F (36.3 C), temperature source Rectal, resp. rate 20, SpO2 100 %.  Evaluation of higher integrative functions including: Level of alertness: Slightly Drowsy, with spontaneous eye opening right gaze preference, neglecting the left side  Orientation  to time, place and person - Unable to assess  Recent and remote memory - Unable to assess  Attention span and concentration  - Unable to assess Speech:  nonverbal . Family reported the patient has been nonverbal for several weeks at baseline  Test the following cranial nerves:  does not blink to confrontation on the left side , appears to neglect left, right gaze preference . No significant facial asymmetry noted pupils equal and reactive time midline  .  Motor examination:  increased tone in all 4 extremities noted, worse on the right side . Able to sustain antigravity strength in bilateral upper extremities with no drift , withdraws to stimulus and lower extremities bilaterally , nearly symmetric. Limited evaluation due to poor cooperation.   Examination of sensation :Response to tactile stimulus in all 4 extremities nearly symmetrically  deep tendon reflexes: 1+, symmetric in  bilateral upper extremities and absent at knees and ankles, normal plantars bilaterally Test coordination:  unable to assess finger nose testing,  no abnormal involuntary movements or tremors noted.  Gait: Unable to assess   Lab Results: Basic Metabolic Panel:  Recent Labs Lab 10/02/15 1258  NA 135  K 4.3  CL 101  GLUCOSE 88  BUN 17  CREATININE 0.90    Liver Function Tests: No results for input(s): AST, ALT, ALKPHOS, BILITOT, PROT, ALBUMIN in the last 168 hours. No results for input(s): LIPASE, AMYLASE in the last 168 hours. No results for input(s): AMMONIA in the last 168 hours.  CBC:  Recent Labs Lab 10/02/15 1258 10/02/15 1307  WBC  --  10.3  NEUTROABS  --  8.3*  HGB 12.2 10.3*  HCT 36.0 32.2*  MCV  --  80.3  PLT  --  400    Cardiac Enzymes: No results for input(s): CKTOTAL, CKMB, CKMBINDEX, TROPONINI in the last 168 hours.  Lipid Panel: No results for input(s): CHOL, TRIG, HDL, CHOLHDL, VLDL, LDLCALC in the last 168 hours.  CBG:  Recent Labs Lab 10/02/15 1246  GLUCAP 84    Microbiology: Results for orders placed or performed during the hospital encounter of 09/04/15  Urine culture     Status: None   Collection Time: 09/04/15  8:49 PM  Result Value Ref Range Status   Specimen Description URINE, CATHETERIZED  Final   Special Requests NONE  Final   Culture   Final    >=100,000 COLONIES/mL ESCHERICHIA COLI >=100,000 COLONIES/mL KLEBSIELLA OXYTOCA    Report Status 09/07/2015 FINAL  Final   Organism ID, Bacteria ESCHERICHIA  COLI  Final   Organism ID, Bacteria KLEBSIELLA OXYTOCA  Final      Susceptibility   Escherichia coli - MIC*    AMPICILLIN <=2 SENSITIVE Sensitive     CEFAZOLIN <=4 SENSITIVE Sensitive     CEFTRIAXONE <=1 SENSITIVE Sensitive     CIPROFLOXACIN >=4 RESISTANT Resistant     GENTAMICIN <=1 SENSITIVE Sensitive     IMIPENEM <=0.25 SENSITIVE Sensitive     NITROFURANTOIN <=16 SENSITIVE Sensitive     TRIMETH/SULFA >=320 RESISTANT Resistant     AMPICILLIN/SULBACTAM <=2 SENSITIVE Sensitive     PIP/TAZO <=4 SENSITIVE Sensitive     * >=100,000 COLONIES/mL ESCHERICHIA COLI   Klebsiella oxytoca - MIC*    AMPICILLIN 16 RESISTANT Resistant     CEFAZOLIN 8 SENSITIVE Sensitive     CEFTRIAXONE <=1 SENSITIVE Sensitive     CIPROFLOXACIN <=0.25 SENSITIVE Sensitive     GENTAMICIN <=1 SENSITIVE Sensitive     IMIPENEM <=0.25 SENSITIVE Sensitive     NITROFURANTOIN <=16 SENSITIVE Sensitive     TRIMETH/SULFA <=20 SENSITIVE Sensitive     AMPICILLIN/SULBACTAM 4 SENSITIVE Sensitive     PIP/TAZO <=4 SENSITIVE Sensitive     * >=100,000 COLONIES/mL KLEBSIELLA OXYTOCA     Imaging: Ct Head Wo Contrast  10/02/2015  ADDENDUM REPORT: 10/02/2015 13:00 ADDENDUM: The original report was by Dr. Gaylyn Rong. The following addendum is by Dr. Gaylyn Rong: Critical Value/emergent results were called by telephone at the time of interpretation on 10/02/2015 at 12:45 pm to Dr. Mosie Epstein , who verbally acknowledged these results. Electronically Signed   By: Gaylyn Rong M.D.   On: 10/02/2015 13:00  10/02/2015  CLINICAL DATA:  Right-sided gaze. Right arm weakness. History of dementia. Newly nonverbal. EXAM: CT HEAD WITHOUT CONTRAST TECHNIQUE: Contiguous axial images were obtained from the base of the skull through the vertex without intravenous contrast. COMPARISON:  09/05/2015 FINDINGS: Stable hypodensities in the thalami and left lentiform nucleus with bilateral globus pallidus calcifications and confluent  periventricular white matter hypodensities similar to prior, compatible with chronic microvascular white matter disease and chronic lacunar infarcts. There is loss of gray-white differentiation in the right occipital lobe suggesting a PCA infarct. This is new compared to the prior exam. No additional new significant finding is observed. No intracranial hemorrhage or mass lesion. Chronic bilateral maxillary sinusitis noted with some frothy fluid in the right maxillary sinus. Chronic ethmoid sinusitis noted with fluid level in the right sphenoid sinus and frothy fluid in the left sphenoid sinus. Chronic bifrontal sinusitis. There is atherosclerotic calcification of the cavernous carotid arteries bilaterally. IMPRESSION: 1. Loss of gray-white differentiation in the right occipital lobe suspicious for an acute/subacute right PCA distribution infarct. This was not present on 09/05/2015. 2. Advanced chronic ischemic microvascular white matter disease with scattered lacunar infarcts in the left lentiform nucleus and thalami. 3. Acute on chronic paranasal sinusitis as detailed above. Electronically Signed: By: Gaylyn Rong M.D. On: 10/02/2015 12:43   Dg Chest Port 1 View  10/02/2015  CLINICAL DATA:  Altered mental status.  Hypertension. EXAM: PORTABLE CHEST 1 VIEW COMPARISON:  07/16/2015 FINDINGS: Cardiomediastinal silhouette is normal. Mediastinal contours appear intact. There is chronic elevation of the right hemidiaphragm. There is no evidence of focal airspace consolidation, pleural effusion or pneumothorax. Osseous structures are without acute abnormality. Soft tissues are grossly normal. IMPRESSION: No active disease. Electronically Signed   By: Ted Mcalpine M.D.   On: 10/02/2015 13:06    Assessment and plan:   Victoria Lewis is an 82 y.o.  female patient who presented with right gaze preference and appears to be neglecting left side. Otherwise no other definite focality noted with limited  neurological examination. CT of the brain showed hypodensity in the right occipital lobe suggestive of an acute to early subacute right PCA infarct. Per family, they noticed the change with right gaze preference around 8 AM this morning, and hence she was outside of any acute IV TPA window at the time of evaluation the ER. NIHSS 17, see documentation for details and code stroke times. She has history of advanced dementia, poor baseline functioning, dependent completely for her ADLs, has been nonambulatory and has been speaking well for several days per family. Based on increased tone in the limbs, suspect that she has advanced Parkinson's disease as well. Recommend MRI of the brain as well as MRA of the head for furtherstroke workup. She has a loop recorder, and hence need to evaluate MRI safety with this device. TTE to r/o cardiac thrombus, and evaluate EF.   We'll start aspirin 325 mg rectal daily. She'll be admitted to the hospitalist service. Defer further metabolic workup and supportive care to the hospitalist team.  Recommend checking for UTI , and rule out any systemic infections with blood culture, chest x-ray.  Also recommend an EEG to rule out any underlying epileptiform disorder contributing to worsening of her mental status.  Discussed the neurological assessment, with a possible tear for right PCA infarct, the reason for not giving IV TPA as she is outside of the time window with the patient's family and answered several of their questions to their satisfaction. Discussed with ER physician.  Stroke service will continue to follow-up starting tomorrow morning.

## 2015-10-03 ENCOUNTER — Encounter (HOSPITAL_COMMUNITY): Payer: Medicare Other

## 2015-10-03 DIAGNOSIS — I10 Essential (primary) hypertension: Secondary | ICD-10-CM

## 2015-10-03 DIAGNOSIS — Z86718 Personal history of other venous thrombosis and embolism: Secondary | ICD-10-CM

## 2015-10-03 DIAGNOSIS — R414 Neurologic neglect syndrome: Secondary | ICD-10-CM

## 2015-10-03 DIAGNOSIS — I6340 Cerebral infarction due to embolism of unspecified cerebral artery: Secondary | ICD-10-CM

## 2015-10-03 DIAGNOSIS — N390 Urinary tract infection, site not specified: Secondary | ICD-10-CM

## 2015-10-03 LAB — LIPID PANEL
CHOLESTEROL: 260 mg/dL — AB (ref 0–200)
HDL: 37 mg/dL — AB (ref >40)
LDL Cholesterol: 187 mg/dL — ABNORMAL HIGH (ref 0–99)
TRIGLYCERIDES: 180 mg/dL — AB (ref <150)
Total CHOL/HDL Ratio: 7 RATIO
VLDL: 36 mg/dL (ref 0–40)

## 2015-10-03 MED ORDER — ASPIRIN 325 MG PO TABS
325.0000 mg | ORAL_TABLET | Freq: Every day | ORAL | Status: DC
  Administered 2015-10-03 – 2015-10-05 (×3): 325 mg via ORAL
  Filled 2015-10-03 (×3): qty 1

## 2015-10-03 NOTE — Progress Notes (Signed)
STROKE TEAM PROGRESS NOTE   HISTORY OF PRESENT ILLNESS Victoria Lewis is an 82 y.o. female patient who was brought in by the EMS. Lives with her husband at home. Patient unable to provide any history. Her son and daughter-in-law are at bedside who provided the history. At baseline, she has advanced dementia and completely dependent for her ADLs. She is reportedly not speaking well for several days per family, nonambulatory, family reported stiffness in all limbs. Need help with ADLs. This morning 10/02/2015 around 8:00 (LKW) when she is sitting on a chair, family noticed that she slumped over to the right side, and since then appear to be looking only to the right and neck lifting the left side. There are not clear if she had any new weakness in arms and legs given her poor baseline function. History is obtained from: Son and daughter-in-law. Patient was not administered TPA secondary to delay in arrival. She was admitted for further evaluation and treatment.   SUBJECTIVE (INTERVAL HISTORY) Her daughter Victoria Lewis is at the bedside Victoria Lewis lives in [place]) .  Overall she feels her condition is rapidly worsening. Daughter has noticed significant decline in patient's mental status since Christmas. We discussed potential hospice involvement. Family is committed to keeping patient on home. The patient has had multiple syncopal episodes in the past and has a loop recorder inserted previously   OBJECTIVE Temp:  [97.3 F (36.3 C)-98.9 F (37.2 C)] 98.2 F (36.8 C) (01/24 0957) Pulse Rate:  [88-128] 88 (01/24 0957) Cardiac Rhythm:  [-] Normal sinus rhythm (01/24 0747) Resp:  [17-23] 18 (01/24 0957) BP: (147-192)/(56-115) 150/56 mmHg (01/24 0957) SpO2:  [96 %-100 %] 99 % (01/24 0957) Weight:  [59.3 kg (130 lb 11.7 oz)] 59.3 kg (130 lb 11.7 oz) (01/23 1611)  CBC:   Recent Labs Lab 10/02/15 1258 10/02/15 1307  WBC  --  10.3  NEUTROABS  --  8.3*  HGB 12.2 10.3*  HCT 36.0 32.2*  MCV  --  80.3   PLT  --  400    Basic Metabolic Panel:   Recent Labs Lab 10/02/15 1258 10/02/15 1307  NA 135 136  K 4.3 4.3  CL 101 101  CO2  --  22  GLUCOSE 88 92  BUN 17 13  CREATININE 0.90 0.91  CALCIUM  --  9.5    Lipid Panel:     Component Value Date/Time   CHOL 260* 10/03/2015 0616   TRIG 180* 10/03/2015 0616   HDL 37* 10/03/2015 0616   CHOLHDL 7.0 10/03/2015 0616   VLDL 36 10/03/2015 0616   LDLCALC 187* 10/03/2015 0616   HgbA1c:  Lab Results  Component Value Date   HGBA1C 5.7* 12/19/2014   Urine Drug Screen:     Component Value Date/Time   LABOPIA NONE DETECTED 10/02/2015 1224   COCAINSCRNUR NONE DETECTED 10/02/2015 1224   LABBENZ NONE DETECTED 10/02/2015 1224   AMPHETMU NONE DETECTED 10/02/2015 1224   THCU NONE DETECTED 10/02/2015 1224   LABBARB NONE DETECTED 10/02/2015 1224      IMAGING  Ct Head Wo Contrast 10/02/2015   1. Loss of gray-white differentiation in the right occipital lobe suspicious for an acute/subacute right PCA distribution infarct. This was not present on 09/05/2015. 2. Advanced chronic ischemic microvascular white matter disease with scattered lacunar infarcts in the left lentiform nucleus and thalami. 3. Acute on chronic paranasal sinusitis   Mr Brain Wo Contrast 10/02/2015  1. Acute/subacute nonhemorrhagic infarct involving the right posterior temporal lobe and  inferior right occipital lobe. 4. Acute/subacute nonhemorrhagic infarct in the left ACA territory. 6. Extensive medium and distal small vessel disease. 7. Advanced atrophy and diffuse white matter disease corresponding with the extensive small vessel disease.   Mr Maxine Glenn Head Wo Contrast 10/02/2015   2. High-grade stenosis or occlusion of the distal right P2 segment with nonvisualization of the medial PCA branch corresponding to the acute infarct. 3. Tandem high-grade stenoses of the left posterior cerebral artery. 5. High-grade stenosis or occlusion of the left ACA within the left A2 segment.    Dg Chest Port 1 View 10/02/2015   No active disease.    PHYSICAL EXAM  pleasant elderly Caucasian lady currently not in distress sitting up comfortably in bed. . Afebrile. Head is nontraumatic. Neck is supple without bruit.    Cardiac exam no murmur or gallop. Lungs are clear to auscultation. Distal pulses are well felt Neurological Exam :  Awake alert interactive. Speaks only a few words and occasional sentences. Diminished attention, registration, recall. Poor insight. Unable to recognize her daughter. Follows only simple midline commands. Blinks to threat on the right but not so consistently on the left. Pupils irregular equal reactive. Fundi were not visualized. Mild left lower facial asymmetry when she smiles. Tongue midline. Motor system exam no upper or lower extremity drift. Mild weakness of the left grip. Diminished fine finger movements on the left. Orbits right over left upper extremity. Mild left lower extremity weakness. .ASSESSMENT/PLAN [name] is a 82 y.o. female with history of advanced dementia dependent on ADLs presenting not speaking well, slumped to the R. She did not receive IV t-PA due to delay in arrival.   Stroke:  bilateral anterior and posterior territory embolic infarcts due to unknown source  MRI  right posterior temporal lobe, inferior right occipital lobe, left ACA territory infarcts  MRA  Distal R P2, L PCA, L A2 stenoses  LE venous dopplers pending   Loop recorder implanted early 2014, last reading neg as of 09/17/2015  LDL 187  Heparin 5000 units sq tid for VTE prophylaxis Diet regular Room service appropriate?: Yes; Fluid consistency:: Thin. Family refused speech therapy evaluation. They accept the aspiration risk  No antithrombotic prior to admission, now on aspirin 325 mg daily  Therapy recommendations:  pending   Disposition:  Return home (lives with husband, has help in the home from 8a-1p daily, family with 4 kids are supportive  patient staying at home). We did discuss possible  Involvement of hospice in the home to assist with patient and family needs. Daughter to call local hospice to evaluate if there are potential benefits for the home/famiy (end stage dementia with recurrent strokes, at risk for more). If so, she will get referral from primary M.D.  Hypertension  elevated  Permissive hypertension (OK if < 220/120) but gradually normalize in 5-7 days  Hyperlipidemia  Home meds:  No statin   LDL 187, goal < 70  Statin not recommended due to advanced dementia  Other Stroke Risk Factors  Advanced age  Family hx stroke (mother)  Hx stroke  April 2016 Non-dominant right MCA punctate cortical infarcts, embolic secondary to LE DVT in the setting of PFO.  Other Active Problems  Advanced dementia  GERD  CKD stage III  CHB  Asymptomatic bacteriuria   NOTHING FURTHER TO ADD FROM THE STROKE STANDPOINT  Patient has a 10-15% risk of having another stroke over the next year, the highest risk is within 2 weeks of the  most recent stroke/TIA (risk of having a stroke following a stroke or TIA is the same). Daughter is aware of the risk.  Ongoing risk factor control by Primary Care Physician  Stroke Service will sign off. Please call should any needs arise.  Follow-up Stroke Clinic at [facility] with Dr. Marvel Plan as appropriate.   Hospital day # 1  [facility] See Amion for Pager information 10/03/2015 2:46 PM  I have personally examined this patient, reviewed notes, independently viewed imaging studies, participated in medical decision making and plan of care. I have made any additions or clarifications directly to the above note. Agree with note above.  She presented with altered mental status and is deviation an MRI scan shows bilateral embolic infarcts. Patient has significant progressive dementia at baseline and poor quality of life and she is not a  candidate for aggressive evaluation  or anticoagulation. I a long discussion the patient's daughter the bedside regarding her condition and family's wishes to keep the patient at home and son do not believe aggressive stroke workup is necessary. She does however remain at risk for neurological worsening, recurrent stroke, TIA. I strongly recommend discussion about hospice at home. Delia Heady, MD Medical Director [facility] Stroke Center Pager: [phone] 10/03/2015 3:54 PM    To contact Stroke Continuity provider, please refer to [email or web address]. After hours, contact General Neurology]

## 2015-10-03 NOTE — Progress Notes (Signed)
Family Medicine Teaching Service Daily Progress Note Intern Pager: [phone]  [name] Medical record number: [id number] Date of birth: Dec 12, 1927 Age: 82 y.o. Gender: female  Primary Care Provider: Uvaldo Rising, MD Consultants: neurology Code Status: full  Pt Overview and Major Events to Date:  01/23/-admitted with stroke  Assessment and Plan: Victoria Lewis is a 82 y.o. female presenting with left sided neglect . PMH is significant for recurrent h/o CVA Right MCA, DVT, PFO, UTI, CKD stage III, dementia, complete heart block.  L sided neglect: improved. CT head suggestive of acute/subacute right PCA infarct. MRI/MRA:Acute/subacute nonhemorrhagic infarct involving the right posterior temporal & inferior right occipital lobe. High-grade stenosis or occlusion of the distal right P2 segment with nonvisualization of the medial PCA branch corresponding to the acute infarct. Tandem high-grade stenoses of the left posterior cerebral artery. Acute/subacute nonhemorrhagic infarct in the left ACA territory. High-grade stenosis or occlusion of the left ACA within the left A2 segment. Extensive medium and distal small vessel disease. Advanced atrophy and diffuse white matter disease corresponding with the extensive small vessel disease. Last known well was before 8am today. Patient with h/o stroke in 12/2014 secondary to DVT/PFO. She was treated with 6 months of Eliquis. Lipid panel total 291, LDL 211. 12/2014 A1c 5.7. Family elected to hold statin in the past given degree of vascular dementia - Vitals per floor protocol - Appreciate neuro recs - EEG - ASA 325 daily  - Neuro checks q4 - family declined SLP/PT/OT evals - Permissive HTN to 220/120 - Continue gabapentin, tylenol - continue tele  Asymptomatic bacteruria: UA with large leuks, positive nitrites, mod hgb, many bacteria. Last UTI 12/16 with E coli and Klebsiella oxytoca sensitive to cephalosporins and  nitrofurantoin. Afebrile and no leukocytosis so low suspicion for sepsis. - f/u urine culture. No clear utility if no signs and symptoms - f/u Blood cultures - Consider starting Rocephin if she starts having temperatures > 99.  CKD stage III: Cr 0.91 on admission - Avoid nephrotoxic agents  Complete heart block: has [employer] loop recorder placed 10/2013. EKG with moderate artifacts but seemingly sinus without ischemia. - Cardiac monitoring  Dementia: dependent of ADLs  - At increased risk of delirium while hospitalized - Will continue to monitor.   FEN/GI:  Regular diet. Family refused SLP eval  Prophylaxis: scds  Disposition: home pending stroke work up and clinical improvement. Follow up with Dr. Versie Starks recs  Subjective:  Patient lying in bed and smiling when I ask questions. She doesn't respond to questions. However, she intermittently follows command. Daughter reports that her left-sided neglect has improved. She also reports that her urine smell's different.   Objective: Temp:  [97.3 F (36.3 C)-98.9 F (37.2 C)] 97.3 F (36.3 C) (01/24 0113) Pulse Rate:  [96-128] 100 (01/24 0254) Resp:  [17-23] 18 (01/24 0254) BP: (147-192)/(81-115) 158/88 mmHg (01/24 0254) SpO2:  [96 %-100 %] 98 % (01/24 0254) Weight:  [130 lb 11.7 oz (59.3 kg)] 130 lb 11.7 oz (59.3 kg) (01/23 1611) Physical Exam: Gen: appears frail, smiling back but not answering quesitons Eyes: pupils equal, round and reactive to light. Able to look on both sides but doesn't answer questions. Nares: clear, no erythema, swelling or congestion Oropharynx: clear, moist CV: regular rate and rythm. S1 & S2 audible Resp: no apparent work of breathing, clear to auscultation bilaterally. GI: bowel sounds normal, no tenderness to palpation GU: no suprapubic tenderness Skin: two healing wounds on left leg about a centimeter  in diameter, doesn't appear infected. Another proximally and laterally with tape on it. Neuro:  Awake, alert, can look to both sides today, normal grip strength bilaterally, able to wiggle her toes  Laboratory:  Recent Labs Lab 10/02/15 1258 10/02/15 1307  WBC  --  10.3  HGB 12.2 10.3*  HCT 36.0 32.2*  PLT  --  400    Recent Labs Lab 10/02/15 1258 10/02/15 1307  NA 135 136  K 4.3 4.3  CL 101 101  CO2  --  22  BUN 17 13  CREATININE 0.90 0.91  CALCIUM  --  9.5  PROT  --  6.6  BILITOT  --  0.3  ALKPHOS  --  98  ALT  --  11*  AST  --  22  GLUCOSE 88 92    Imaging/Diagnostic Tests: Ct Head Wo Contrast  10/02/2015  ADDENDUM REPORT: 10/02/2015 13:00 ADDENDUM: The original report was by Dr. Gaylyn Rong. The following addendum is by Dr. Gaylyn Rong: Critical Value/emergent results were called by telephone at the time of interpretation on 10/02/2015 at 12:45 pm to Dr. Mosie Epstein , who verbally acknowledged these results. Electronically Signed   By: Gaylyn Rong M.D.   On: 10/02/2015 13:00  10/02/2015  CLINICAL DATA:  Right-sided gaze. Right arm weakness. History of dementia. Newly nonverbal. EXAM: CT HEAD WITHOUT CONTRAST TECHNIQUE: Contiguous axial images were obtained from the base of the skull through the vertex without intravenous contrast. COMPARISON:  09/05/2015 FINDINGS: Stable hypodensities in the thalami and left lentiform nucleus with bilateral globus pallidus calcifications and confluent periventricular white matter hypodensities similar to prior, compatible with chronic microvascular white matter disease and chronic lacunar infarcts. There is loss of gray-white differentiation in the right occipital lobe suggesting a PCA infarct. This is new compared to the prior exam. No additional new significant finding is observed. No intracranial hemorrhage or mass lesion. Chronic bilateral maxillary sinusitis noted with some frothy fluid in the right maxillary sinus. Chronic ethmoid sinusitis noted with fluid level in the right sphenoid sinus and frothy fluid in the  left sphenoid sinus. Chronic bifrontal sinusitis. There is atherosclerotic calcification of the cavernous carotid arteries bilaterally. IMPRESSION: 1. Loss of gray-white differentiation in the right occipital lobe suspicious for an acute/subacute right PCA distribution infarct. This was not present on 09/05/2015. 2. Advanced chronic ischemic microvascular white matter disease with scattered lacunar infarcts in the left lentiform nucleus and thalami. 3. Acute on chronic paranasal sinusitis as detailed above. Electronically Signed: By: Gaylyn Rong M.D. On: 10/02/2015 12:43   Mr Maxine Glenn Head Wo Contrast  10/02/2015  CLINICAL DATA:  The patient has been nonverbal several days. Advanced dementia. The patient has been cleaning and her chair to the right. EXAM: MRI HEAD WITHOUT CONTRAST MRA HEAD WITHOUT CONTRAST TECHNIQUE: Multiplanar, multiecho pulse sequences of the brain and surrounding structures were obtained without intravenous contrast. Angiographic images of the head were obtained using MRA technique without contrast. COMPARISON:  CT head without contrast 10/02/2015. MRI brain 12/19/2014. FINDINGS: MRI HEAD FINDINGS The diffusion-weighted images demonstrate an acute/subacute nonhemorrhagic infarct involving the posterior right temporal lobe and medial a occipital lobe in a PCA distribution. Do there is also focal restricted diffusion involving the posterior inferior right ACA territory adjacent to the left lateral ventricle. Atrophy and advanced white matter disease is present bilaterally. There are remote infarcts involving the basal ganglia bilaterally. White matter changes extend into the brainstem. Other remote lacunar infarcts are again seen in the cerebellum. Flow is present  in the major intracranial arteries. The patient is status post bilateral lens replacements. The globes and orbits are otherwise intact. Fluid levels are present in the maxillary sinuses bilaterally. There is scattered opacification  of ethmoid air cells. Mucosal thickening is present in the frontal sinuses bilaterally. There is fluid in both sphenoid sinuses. The mastoid air cells are clear. Exaggerated cervical lordosis is present. There is slight anterolisthesis at C4-5 in retrolisthesis at C5-6. MRA HEAD FINDINGS The internal carotid arteries demonstrates mild irregularity throughout the cavernous segments bilaterally without a significant stenosis relative to the more distal vessel. 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 There is moderate stenosis in the left A1 segment. There is a high-grade stenosis or occlusion of the left A2 segment. Moderate stenosis is present in the right A2 approximately 9 mm from the anterior communicating artery. There is mild narrowing of the distal M1 segments bilaterally. Moderate small vessel attenuation is present bilaterally with high-grade distal stenoses on the right. The The left vertebral artery is the dominant vessel. The left PICA is visualized and normal. The right PICA is not visualized. The basilar artery is within normal limits. Both posterior cerebral arteries originate from the basilar tip. There is a high-grade focal stenosis with signal loss in the proximal left P2 segment and a moderate stenosis on the right. A high-grade distal right P2 stenosis is present without visualization of the medial branch. This corresponds with the area of acute infarction. A high-grade stenosis is present at the left PCA bifurcation as well. Both distal branch vessels are visualized. IMPRESSION: 1. Acute/subacute nonhemorrhagic infarct involving the right posterior temporal lobe and inferior right occipital lobe. 2. High-grade stenosis or occlusion of the distal right P2 segment with nonvisualization of the medial PCA branch corresponding to the acute infarct. 3. Tandem high-grade stenoses of the left posterior cerebral artery. 4. Acute/subacute nonhemorrhagic infarct in the left ACA territory. 5. High-grade  stenosis or occlusion of the left ACA within the left A2 segment. 6. Extensive medium and distal small vessel disease. 7. Advanced atrophy and diffuse white matter disease corresponding with the extensive small vessel disease. Electronically Signed   By: Marin Roberts M.D.   On: 10/02/2015 14:45   Mr Brain Wo Contrast  10/02/2015  CLINICAL DATA:  The patient has been nonverbal several days. Advanced dementia. The patient has been cleaning and her chair to the right. EXAM: MRI HEAD WITHOUT CONTRAST MRA HEAD WITHOUT CONTRAST TECHNIQUE: Multiplanar, multiecho pulse sequences of the brain and surrounding structures were obtained without intravenous contrast. Angiographic images of the head were obtained using MRA technique without contrast. COMPARISON:  CT head without contrast 10/02/2015. MRI brain 12/19/2014. FINDINGS: MRI HEAD FINDINGS The diffusion-weighted images demonstrate an acute/subacute nonhemorrhagic infarct involving the posterior right temporal lobe and medial a occipital lobe in a PCA distribution. Do there is also focal restricted diffusion involving the posterior inferior right ACA territory adjacent to the left lateral ventricle. Atrophy and advanced white matter disease is present bilaterally. There are remote infarcts involving the basal ganglia bilaterally. White matter changes extend into the brainstem. Other remote lacunar infarcts are again seen in the cerebellum. Flow is present in the major intracranial arteries. The patient is status post bilateral lens replacements. The globes and orbits are otherwise intact. Fluid levels are present in the maxillary sinuses bilaterally. There is scattered opacification of ethmoid air cells. Mucosal thickening is present in the frontal sinuses bilaterally. There is fluid in both sphenoid sinuses. The mastoid air cells  are clear. Exaggerated cervical lordosis is present. There is slight anterolisthesis at C4-5 in retrolisthesis at C5-6. MRA HEAD  FINDINGS The internal carotid arteries demonstrates mild irregularity throughout the cavernous segments bilaterally without a significant stenosis relative to the more distal vessel. 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 There is moderate stenosis in the left A1 segment. There is a high-grade stenosis or occlusion of the left A2 segment. Moderate stenosis is present in the right A2 approximately 9 mm from the anterior communicating artery. There is mild narrowing of the distal M1 segments bilaterally. Moderate small vessel attenuation is present bilaterally with high-grade distal stenoses on the right. The The left vertebral artery is the dominant vessel. The left PICA is visualized and normal. The right PICA is not visualized. The basilar artery is within normal limits. Both posterior cerebral arteries originate from the basilar tip. There is a high-grade focal stenosis with signal loss in the proximal left P2 segment and a moderate stenosis on the right. A high-grade distal right P2 stenosis is present without visualization of the medial branch. This corresponds with the area of acute infarction. A high-grade stenosis is present at the left PCA bifurcation as well. Both distal branch vessels are visualized. IMPRESSION: 1. Acute/subacute nonhemorrhagic infarct involving the right posterior temporal lobe and inferior right occipital lobe. 2. High-grade stenosis or occlusion of the distal right P2 segment with nonvisualization of the medial PCA branch corresponding to the acute infarct. 3. Tandem high-grade stenoses of the left posterior cerebral artery. 4. Acute/subacute nonhemorrhagic infarct in the left ACA territory. 5. High-grade stenosis or occlusion of the left ACA within the left A2 segment. 6. Extensive medium and distal small vessel disease. 7. Advanced atrophy and diffuse white matter disease corresponding with the extensive small vessel disease. Electronically Signed   By: Marin Roberts M.D.    On: 10/02/2015 14:45   Dg Chest Port 1 View  10/02/2015  CLINICAL DATA:  Altered mental status.  Hypertension. EXAM: PORTABLE CHEST 1 VIEW COMPARISON:  07/16/2015 FINDINGS: Cardiomediastinal silhouette is normal. Mediastinal contours appear intact. There is chronic elevation of the right hemidiaphragm. There is no evidence of focal airspace consolidation, pleural effusion or pneumothorax. Osseous structures are without acute abnormality. Soft tissues are grossly normal. IMPRESSION: No active disease. Electronically Signed   By: Ted Mcalpine M.D.   On: 10/02/2015 13:06    Almon Hercules, MD 10/03/2015, 7:18 AM PGY-1, [facility] FPTS Intern pager: [phone], text pages welcome

## 2015-10-03 NOTE — Discharge Summary (Signed)
[facility] Discharge Summary  [name] Medical record number: [id number] Date of birth: November 26, 1927 Age: 82 y.o. Gender: female Date of Admission: 10/02/2015  Date of Discharge: /126/2107 Admitting Physician: Blane Ohara McDiarmid, MD  Primary Care Provider: Lupita Dawn, MD Consultants: neurology  Indication for Hospitalization: stroke  Discharge Diagnoses/Problem List:  CVA, DVT, PFO, Pyuria, CKD stage III, dementia, complete heart block, AFib.  Disposition: home with home hospice  Discharge Condition: stable  Discharge Exam:  Filed Vitals:   10/04/15 2214 10/05/15 0112 10/05/15 0617 10/05/15 1037  BP: 174/96 180/84 154/89 130/56  Pulse: 104 87 89 87  Temp: 99.1 F (37.3 C) 97.7 F (36.5 C) 97.6 F (36.4 C) 98.6 F (37 C)  TempSrc: Oral Oral Oral Oral  Resp: '18 18 20 16  '$ Height:      Weight:      SpO2: 95% 97% 97% 97%   Gen: appears frail, more alert and interactive today Oropharynx: clear, moist CV: regular rate and rythm. S1 & S2 audible Resp: no apparent work of breathing, clear to auscultation bilaterally. GI: bowel sounds normal, no tenderness to palpation GU: no suprapubic tenderness Skin: two healing wounds on left leg about a centimeter in diameter, doesn't appear infected. Another proximally and laterally with tape on it. Neuro: awake, follows command, normal grip strength bilaterally, more interactive today.  Brief Hospital Course:  Victoria Lewis is a 82 y.o. female presenting with left sided neglect . PMH is significant for recurrent h/o CVA inr Right MCA territory in 12/2014, DVT, PFO, UTI, CKD stage III, dementia, complete heart block.  L sided neglect: improved. CT head suggestive of acute/subacute right PCA infarct. Patient outside tPA window on arrival to ED. MRI/MRA with acute/subacute nonhemorrhagic infarct involving the right posterior temporal, inferior right occipital lobe and left ACA  territory. High-grade stenosis or occlusion of the distal right P2 segment and left ACA within the left A2 segment. Tandem high-grade stenoses of the left posterior cerebral artery. Extensive medium and distal small vessel disease. Advanced atrophy and diffuse WMD corresponding with the extensive small vessel disease. Family declined SLP/PT/OT evals. Neurology consulted and recommended against further aggressive work up or anticoagulation as patient has significant progressive dementia at baseline. Started on full dose ASA. A1c 5.7. Lipid panel total 291, LDL 211.However, family elected to hold statin in the past when she had CVA given degree of vascular dementia. Patient initially full code status, but changed to DNR/DNI after family meeting with PCP on 10/04/2015. At the same time, goal of care was discussed and family wished to pursue home hospice for the patient. Care management consulted and met with family to help set up this.  Asymptomatic bacteruria: UA with large leuks, positive nitrites, mod hgb, many bacteria.Urine culture with 100,000 GNRs. She had multiple positive urine cultures in the past. No dysuria, fever or leukocytosis. Decided to treat her with keflex per family's request. She was discharged on keflex for 4 more days (through 10/08/2015)  Issues for Follow Up:  1. CVA: family and neurology team agreed on not pursuing aggressive work up or anticoagulation. Her symptoms have improved on discharge.  2. Bacteruria: discharged on keflex to complete 5 days course.  Significant Procedures: none  Significant Labs and Imaging:   Recent Labs Lab 10/02/15 1258 10/02/15 1307  WBC  --  10.3  HGB 12.2 10.3*  HCT 36.0 32.2*  PLT  --  400    Recent Labs Lab 10/02/15 1258  10/02/15 1307  NA 135 136  K 4.3 4.3  CL 101 101  CO2  --  22  GLUCOSE 88 92  BUN 17 13  CREATININE 0.90 0.91  CALCIUM  --  9.5  ALKPHOS  --  98  AST  --  22  ALT  --  11*  ALBUMIN  --  3.7    Results/Tests Pending at Time of Discharge: none  Discharge Medications:    Medication List    ASK your doctor about these medications        acetaminophen 500 MG tablet  Commonly known as:  TYLENOL  Take 500 mg by mouth 4 (four) times daily - after meals and at bedtime. Take one tablet (500 mg) by mouth four times a day     ALIGN 4 MG Caps  Take 4 mg by mouth daily.     BENEFIBER Powd  Take 5-10 mg by mouth See admin instructions. Take '5mg'$  daily, and if needed for constipation take another '5mg'$ .     BLUE-EMU MAXIMUM STRENGTH EX  Apply 1 application topically daily as needed (for arthritis pain).     CRANBERRY PO  Take 240 mLs by mouth 3 (three) times daily. Cranberry concentrate solution     D-MANNOSE PO  Take 1 tablet by mouth 2 (two) times daily.     docusate sodium 100 MG capsule  Commonly known as:  COLACE  Take 1 capsule (100 mg total) by mouth 2 (two) times daily.     fluticasone 50 MCG/ACT nasal spray  Commonly known as:  FLONASE  Place 2 sprays into both nostrils daily.     gabapentin 100 MG capsule  Commonly known as:  NEURONTIN  Take 2 capsules (200 mg total) by mouth 3 (three) times daily. Take 1 capsule three times each day; may take 1 more if needed for breakthrough pain     nystatin 100000 UNIT/GM Powd  Apply to affected area twice daily.     omeprazole 20 MG capsule  Commonly known as:  PRILOSEC  Take one tablet (20 mg) by mouth every morning        Discharge Instructions: Please refer to Patient Instructions section of EMR for full details.  Patient was counseled important signs and symptoms that should prompt return to medical care, changes in medications, dietary instructions, activity restrictions, and follow up appointments.   Follow-Up Appointments:  Mercy Riding, MD 10/05/2015, 11:43 AM PGY-1, [place]

## 2015-10-03 NOTE — Care Management Note (Signed)
Case Management Note  Patient Details  Name: NAKENYA THEALL MRN: [id number] Date of Birth: [birth year]  Subjective/Objective:                    Action/Plan: Patient was admitted with CVA. Recently discharged with UTI/syncope. Lives at home with husband and has a HH aide 3 hrs a day. Daughter lives next door. Will follow for discharge needs pending PT/OT evals and physician orders. Expected Discharge Date:                  Expected Discharge Plan:     In-House Referral:     Discharge planning Services     Post Acute Care Choice:    Choice offered to:     DME Arranged:    DME Agency:     HH Arranged:    HH Agency:     Status of Service:  In process, will continue to follow  Medicare Important Message Given:    Date Medicare IM Given:    Medicare IM give by:    Date Additional Medicare IM Given:    Additional Medicare Important Message give by:     If discussed at Long Length of Stay Meetings, dates discussed:    Additional Comments:  Anda Kraft, RN 10/03/2015, 10:19 AM [phone]

## 2015-10-04 ENCOUNTER — Inpatient Hospital Stay (HOSPITAL_COMMUNITY): Payer: Medicare Other

## 2015-10-04 DIAGNOSIS — W19XXXD Unspecified fall, subsequent encounter: Secondary | ICD-10-CM

## 2015-10-04 DIAGNOSIS — W19XXXA Unspecified fall, initial encounter: Secondary | ICD-10-CM | POA: Insufficient documentation

## 2015-10-04 DIAGNOSIS — Z7189 Other specified counseling: Secondary | ICD-10-CM

## 2015-10-04 DIAGNOSIS — Z66 Do not resuscitate: Secondary | ICD-10-CM | POA: Insufficient documentation

## 2015-10-04 LAB — URINE CULTURE: SPECIAL REQUESTS: NORMAL

## 2015-10-04 LAB — HEMOGLOBIN A1C
Hgb A1c MFr Bld: 5.7 % — ABNORMAL HIGH (ref 4.8–5.6)
Mean Plasma Glucose: 117 mg/dL

## 2015-10-04 MED ORDER — FOSFOMYCIN TROMETHAMINE 3 G PO PACK
3.0000 g | PACK | Freq: Once | ORAL | Status: DC
  Filled 2015-10-04: qty 3

## 2015-10-04 MED ORDER — CEPHALEXIN 500 MG PO CAPS
500.0000 mg | ORAL_CAPSULE | Freq: Two times a day (BID) | ORAL | Status: DC
  Administered 2015-10-04 – 2015-10-05 (×3): 500 mg via ORAL
  Filled 2015-10-04 (×3): qty 1

## 2015-10-04 NOTE — Progress Notes (Signed)
No change overnight from baseline assessment. Pt was incontinent with urine and the nurse came in to provide care and daughter at bedside wanted to assist  .  while making the bed the fitted sheet slipped off and nurse was trying to fit it right. Patient daughter just went off angerily  saying "this nurse does not know  what she is doing and wound not let mother be care for in such manner therefore does not want this nurse  to take care of her mother.This nurse tried to say sorry & explained the nature of the fitted sheet but she will not listen. Care was rendered and warm blanket given and pt made comfortable in bed. Pt assignment was later changed with another nurse.

## 2015-10-04 NOTE — Progress Notes (Signed)
Discussed patient's care with her PCP, Dr Fletke.  There was a family meetiRandolm Idolheld today.  She is now DNR/DNI.  Her family wishes that she go home with home hospice.  A palliative care consult has been placed to assist with this.  DNR paperwork completed by her PCP.    Victoria Lewis M. Nadine Counts, DO PGY-2, [facility]

## 2015-10-04 NOTE — Progress Notes (Signed)
Family Medicine Teaching Service Daily Progress Note Intern Pager: [phone]  [name] Medical record number: [id number] Date of birth: 05-Mar-1928 Age: 82 y.o. Gender: female  Primary Care Provider: Uvaldo Rising, MD Consultants: neurology Code Status: full  Pt Overview and Major Events to Date:  01/23/-admitted with stroke  Assessment and Plan: Victoria Lewis is a 82 y.o. female presenting with left sided neglect . PMH is significant for recurrent h/o CVA Right MCA, DVT, PFO, UTI, CKD stage III, dementia, complete heart block.  L sided neglect: improved. CT head suggestive of acute/subacute right PCA infarct. Patient outside tPA window on arrival to ED. MRI/MRA with acute/subacute nonhemorrhagic infarct involving the right posterior temporal, inferior right occipital lobe and left ACA territory. High-grade stenosis or occlusion of the distal right P2 segment and left ACA within the left A2 segment. Tandem high-grade stenoses of the left posterior cerebral artery. Extensive medium and distal small vessel disease. Advanced atrophy and diffuse WMD corresponding with the extensive small vessel disease. Lipid panel total 291, LDL 211.Family elected to hold statin in the past given degree of vascular dementia. A1c 5.7. Family declined SLP/PT/OT evals. Started on full dose ASA - Vitals per floor protocol - Appreciate neuro recs  -no aggressive work up or anticoagulation although with high risk for CVA - ASA 325 daily  - Neuro checks q4 - family declined SLP/PT/OT evals - Permissive HTN to 220/120 - Continue gabapentin, tylenol - continue tele - GOC discussion with PCP today  Asymptomatic bacteruria: UA with large leuks, positive nitrites, mod hgb, many bacteria.Urine culture with 100,000 GNRs. Blood cultures NGTD. No dysuria, fever or leukocytosis.  - Fosfomycin 3 gm once - Consider starting Rocephin if she starts having temperatures > 99.  CKD stage III: Cr  0.91 on admission - Avoid nephrotoxic agents  Complete heart block: has Medtronic loop recorder placed 10/2013. EKG with moderate artifacts but seemingly sinus without ischemia. - Cardiac monitoring  Dementia: dependent of ADLs  - At increased risk of delirium while hospitalized - Will continue to monitor.   FEN/GI:  Regular diet. Family refused SLP eval  Prophylaxis: scds  Disposition: to be determined after GOC discussion today  Subjective:  Daughter at bedside reports poor MS, oral intake and foul smelling urine. She is interested in treating with antibiotics to avoid readmission.  Objective: Temp:  [98 F (36.7 C)-98.5 F (36.9 C)] 98 F (36.7 C) (01/25 0622) Pulse Rate:  [84-102] 91 (01/25 0622) Resp:  [18] 18 (01/25 0622) BP: (137-170)/(56-89) 154/82 mmHg (01/25 0622) SpO2:  [95 %-100 %] 100 % (01/25 0622)  Physical Exam: Gen: appears frail and tired, follows commands Oropharynx: clear, moist CV: regular rate and rythm. S1 & S2 audible Resp: no apparent work of breathing, clear to auscultation bilaterally. GI: bowel sounds normal, no tenderness to palpation GU: no suprapubic tenderness Skin: two healing wounds on left leg about a centimeter in diameter, doesn't appear infected. Another proximally and laterally with tape on it. Neuro: awake, follows command, normal grip strength bilaterally  Laboratory:  Recent Labs Lab 10/02/15 1258 10/02/15 1307  WBC  --  10.3  HGB 12.2 10.3*  HCT 36.0 32.2*  PLT  --  400    Recent Labs Lab 10/02/15 1258 10/02/15 1307  NA 135 136  K 4.3 4.3  CL 101 101  CO2  --  22  BUN 17 13  CREATININE 0.90 0.91  CALCIUM  --  9.5  PROT  --  6.6  BILITOT  --  0.3  ALKPHOS  --  98  ALT  --  11*  AST  --  22  GLUCOSE 88 92    Imaging/Diagnostic Tests: No results found.  Victoria Hercules, MD 10/04/2015, 6:33 AM PGY-1, [facility] FPTS Intern pager: [phone], text pages welcome

## 2015-10-04 NOTE — Progress Notes (Signed)
OT Cancellation Note  Patient Details Name: Victoria Lewis MRN: [id number] DOB: 26-Dec-1927   Cancelled Treatment:    Reason Eval/Treat Not Completed: OT screened, no needs identified, will sign off. Resident note from today says family not wishing for SLP/PT/OT but orders not D/C'd. I spoke with family and they indeed to NOT want SLP (their note already written)/OT/PT (do not even see order). Acute OT will not eval, we will sign off.  Evette Georges [phone] 10/04/2015, 11:40 AM

## 2015-10-05 ENCOUNTER — Telehealth: Payer: Self-pay | Admitting: *Deleted

## 2015-10-05 DIAGNOSIS — R4182 Altered mental status, unspecified: Secondary | ICD-10-CM

## 2015-10-05 MED ORDER — CEPHALEXIN 500 MG PO CAPS: 500.0000 mg | ORAL_CAPSULE | Freq: Two times a day (BID) | ORAL | Status: DC

## 2015-10-05 NOTE — Telephone Encounter (Signed)
Victoria Lewis, [facility] called requesting to speak with Dr. Randolm Idol.  Patient will be discharged today from the Hospital under hospice care.  Will Dr. Randolm Idol be the attending physician and will he like symptom management from the hospice doctors.  Please give her a call at [phone].  Clovis Pu, RN

## 2015-10-05 NOTE — Care Management Note (Signed)
Case Management Note  Patient Details  Name: Victoria Lewis MRN: [id number] Date of Birth: 1928-01-24  Subjective/Objective:                    Action/Plan: Plan is for patient to discharge home with hospice care. CM met with the patients daughter and provided her a list of hospice agencies in the [facility] area. She selected [facility]. CM called [facility] and they are going over the patients information and accepted the referral. CM will continue to follow for discharge needs.   Expected Discharge Date:                  Expected Discharge Plan:  Home w Hospice Care  In-House Referral:     Discharge planning Services  CM Consult  Post Acute Care Choice:    Choice offered to:  Adult Children  DME Arranged:    DME Agency:     HH Arranged:    HH Agency:     Status of Service:  In process, will continue to follow  Medicare Important Message Given:    Date Medicare IM Given:    Medicare IM give by:    Date Additional Medicare IM Given:    Additional Medicare Important Message give by:     If discussed at [place] of Stay Meetings, dates discussed:    Additional Comments:  Pollie Friar, RN 10/05/2015, 10:30 AM

## 2015-10-05 NOTE — Progress Notes (Signed)
Family Medicine Teaching Service Daily Progress Note Intern Pager: [phone]  [name] Medical record number: [id number] Date of birth: [birth year] Age: 82 y.o. Gender: female  Primary Care Provider: Uvaldo Rising, MD Consultants: neurology Code Status: DNR  Pt Overview and Major Events to Date:  01/23/-admitted with stroke  Assessment and Plan: [name] is a 82 y.o. female presenting with left sided neglect . PMH is significant for recurrent h/o CVA Right MCA, DVT, PFO, UTI, CKD stage III, dementia, complete heart block.  L sided neglect: improved. No aggressive work up or anticoagulation for CVA. Family elected comfort care after discussion with PCP.  -Follow palliative consult. They will see her this morning -CM consult placed. -Continue gabapentin, tylenol  Asymptomatic bacteruria: UA with large leuks, positive nitrites, mod hgb, many bacteria.Urine culture with 100,000 GNRs. Blood cultures NGTD. No dysuria, fever or leukocytosis.  - Keflex 500 mg twice a day on 01/25>  CKD stage III: Cr 0.91 on admission - Avoid nephrotoxic agents  Complete heart block: has Medtronic loop recorder placed 10/2013. EKG with moderate artifacts but seemingly sinus without ischemia.  Dementia: dependent of ADLs  - At increased risk of delirium while hospitalized - Will continue to monitor.   FEN/GI:  Regular diet. Family refused SLP eval  Prophylaxis: scds  Disposition: home with home hospice  Subjective:  Family meeting yesterday. She is now DNR/DNI. Her family wishes that she go home with home hospice.A palliative care consult has been placed to assist with this. DNR paperwork completed by her PCP.  Today, daughter at bedside. Reports significant improvement. She is more interactive (smiling) & eating well. Urine odor improved after antibiotics. Daughter asks for early discharge if possible.  Temp:  [97.6 F (36.4 C)-99.1 F (37.3 C)] 97.6 F (36.4  C) (01/26 0617) Pulse Rate:  [84-104] 89 (01/26 0617) Resp:  [18-20] 20 (01/26 0617) BP: (151-181)/(73-96) 154/89 mmHg (01/26 0617) SpO2:  [95 %-98 %] 97 % (01/26 0617)  Physical Exam: Gen: appears frail, more interactive today, follows commands, said bye at the end. Resp: no apparent work of breathing Neuro: awake, follows command, normal grip strength bilaterally, more interactive today.  Laboratory:  Recent Labs Lab 10/02/15 1258 10/02/15 1307  WBC  --  10.3  HGB 12.2 10.3*  HCT 36.0 32.2*  PLT  --  400    Recent Labs Lab 10/02/15 1258 10/02/15 1307  NA 135 136  K 4.3 4.3  CL 101 101  CO2  --  22  BUN 17 13  CREATININE 0.90 0.91  CALCIUM  --  9.5  PROT  --  6.6  BILITOT  --  0.3  ALKPHOS  --  98  ALT  --  11*  AST  --  22  GLUCOSE 88 92    Imaging/Diagnostic Tests: No results found.  Almon Hercules, MD 10/05/2015, 7:16 AM PGY-1, [facility] FPTS Intern pager: [phone], text pages welcome

## 2015-10-05 NOTE — Telephone Encounter (Signed)
Dorisann Frames RN to contact Hospice to let them know that I will be the attending.

## 2015-10-05 NOTE — Progress Notes (Signed)
Notified by Sabino Donovan of family request for [facility] services at home after discharge. Chart and patient information currently under review to confirm hospice eligibility.   Spoke with daughter, Dewayne Hatch at bedside to initiate education related to hospice philosophy, services and team approach to care. Family verbalized understanding of the information provided. Per discussion, plan is for discharge to home by personal vehicle with son and daughter today.   Please send signed, completed DNR form home with patient.   DME needs discussed and family denied any needs at this time.  [facility] aware of the above.  Completed discharge summary will need to be faxed to [facility] at [phone] when final.  Please notify [facility] when patient is ready to leave unit at discharge-call [phone].  [facility] information and contact numbers have been given to daughter during visit.   Please call with any questions.  Thank You,  Hessie Knows RN, BSN  [facility] Liaison  [phone]

## 2015-10-05 NOTE — Progress Notes (Signed)
Pt discharged home with hospice. IV removed and discharge instructions given by [facility] nursing student accompanied by Alan Mulder, RN, MSN. Pt left unit via wheelchair with family at 1400. Lawson Radar

## 2015-10-05 NOTE — Care Management Note (Signed)
Case Management Note  Patient Details  Name: Victoria Lewis MRN: [id number] Date of Birth: 09/21/1927  Subjective/Objective:                    Action/Plan: Plan is for patient to discharge home today with home hospice services. Family is going to transport patient home via private vehicle. Daughter Thurston Hole was given DNR form for home hospice.   Expected Discharge Date:                  Expected Discharge Plan:  Home w Hospice Care  In-House Referral:     Discharge planning Services  CM Consult  Post Acute Care Choice:    Choice offered to:  Adult Children  DME Arranged:    DME Agency:     HH Arranged:    HH Agency:     Status of Service:  In process, will continue to follow  Medicare Important Message Given:    Date Medicare IM Given:    Medicare IM give by:    Date Additional Medicare IM Given:    Additional Medicare Important Message give by:     If discussed at Long Length of Stay Meetings, dates discussed:    Additional Comments:  Kermit Balo, RN 10/05/2015, 1:33 PM

## 2015-10-05 NOTE — Telephone Encounter (Signed)
Order given to [facility] that Dr. Randolm Idol will be the attending physician and hospice physicians for symptom management.  Clovis Pu, RN

## 2015-10-05 NOTE — Discharge Instructions (Signed)
It has been a pleasure taking care of you! You were admitted due to stroke and urinary tract infection. We have treated the infection with antibiotics. We are discharging you on more of this antibiotic that you need to continue taking after you leave the hospital. After a discussion with your family, we have made a decision ro pursue hospice care vs. Aggressive treatment for stroke. We may have made some adjustments to your other medications. Please, make sure to read the directions before you take them. The names and directions on how to take these medications are found on this discharge paper under medication section.  Take care,

## 2015-10-06 ENCOUNTER — Ambulatory Visit: Payer: Medicare Other | Admitting: Family Medicine

## 2015-10-07 LAB — CULTURE, BLOOD (ROUTINE X 2)
CULTURE: NO GROWTH
Culture: NO GROWTH

## 2015-10-23 ENCOUNTER — Other Ambulatory Visit: Payer: Self-pay | Admitting: Family Medicine

## 2015-10-23 MED ORDER — CEPHALEXIN 500 MG PO CAPS: 500.0000 mg | ORAL_CAPSULE | Freq: Two times a day (BID) | ORAL | Status: DC

## 2015-10-23 NOTE — Progress Notes (Signed)
Received UA from Hospice obtained 10/19/15. Consistent with UTI. Called and spoke to daughter [name]. Mother has been more lethargic/eating less/increased urine odor. Started on Cipro 3 days ago with no improvement. Per last urine cx. Resistant to Cipro. Will start Keflex. Sent to pharmacy.

## 2015-10-27 ENCOUNTER — Ambulatory Visit (INDEPENDENT_AMBULATORY_CARE_PROVIDER_SITE_OTHER): Admitting: *Deleted

## 2015-10-27 DIAGNOSIS — R55 Syncope and collapse: Secondary | ICD-10-CM

## 2015-10-30 NOTE — Progress Notes (Signed)
Carelink Summary Report / Loop Recorder 

## 2015-11-07 ENCOUNTER — Encounter: Payer: Self-pay | Admitting: Internal Medicine

## 2015-11-08 ENCOUNTER — Other Ambulatory Visit: Payer: Self-pay | Admitting: Family Medicine

## 2015-11-08 DIAGNOSIS — L304 Erythema intertrigo: Secondary | ICD-10-CM

## 2015-11-08 NOTE — Telephone Encounter (Signed)
Daughter called and would like a new Medication List mailed to her home. She would also like to have Senna put on the list since her mother takes this occasionally. Her mother also needs a refill on her Nystatin and her mother uses a cream  called Clotrimazole for the welts she gets . jw

## 2015-11-09 MED ORDER — NYSTATIN 100000 UNIT/GM EX POWD: CUTANEOUS | Status: DC

## 2015-11-09 MED ORDER — SENNA 8.6 MG PO TABS: 1.0000 | ORAL_TABLET | Freq: Every day | ORAL | Status: AC | PRN

## 2015-11-09 NOTE — Telephone Encounter (Signed)
I have sent in the Nystatin and added Senna to her medication list.  Clotrimazole is an OTC medication.  Please print and send a copy of her medication list for patient.  I am covering for Dr. Randolm Idol while he is out.

## 2015-11-10 NOTE — Telephone Encounter (Signed)
Medication list mailed to address listed in chart.

## 2015-11-11 ENCOUNTER — Other Ambulatory Visit: Payer: Self-pay | Admitting: Family Medicine

## 2015-11-15 NOTE — Telephone Encounter (Signed)
2nd request.  Martin, Tamika L, RN  

## 2015-11-16 ENCOUNTER — Telehealth: Payer: Self-pay | Admitting: Internal Medicine

## 2015-11-16 LAB — CUP PACEART REMOTE DEVICE CHECK: Date Time Interrogation Session: 20170119023638

## 2015-11-16 NOTE — Telephone Encounter (Signed)
After hours telephone call  Hospice RN Albin Felling(Carla) calls to discuss patient's status.  No fevers, less responsive, foul order to urine.  Tried to obtain I&O cath for urine sample and unable to obtain.  BPs 140s/70s usually, now 210/80-90s and 180.  Patient is full comfort care.  RN suggested starting antibiotics for presumed UTI given history of recurrent UTIs.  Family would prefer to wait for urine sample to be collected in AM and then start abx if warranted.  Agree with family.  OK to let BP run a little high.  Will notify PCP.  Erasmo DownerAngela M Teegan Guinther, MD, MPH PGY-2,  [facility] 11/16/2015 8:59 PM

## 2015-11-17 ENCOUNTER — Telehealth: Payer: Self-pay | Admitting: *Deleted

## 2015-11-17 ENCOUNTER — Telehealth: Payer: Self-pay | Admitting: Family Medicine

## 2015-11-17 MED ORDER — CEPHALEXIN 500 MG PO CAPS: 500.0000 mg | ORAL_CAPSULE | Freq: Two times a day (BID) | ORAL | Status: DC

## 2015-11-17 NOTE — Telephone Encounter (Signed)
Will empirically treat for UTI with keflex. Await culture and sensitivity results.

## 2015-11-17 NOTE — Telephone Encounter (Signed)
Patty, RN with hospice and palliative care called stating patient's family think she has another UTI.  She is requesting verbal order for In & Out cath or clean catch for UA and sensitivity.  Verbal order given by Dr. Randolm IdolFletke to In & Out cath for UA & CNS.  Dr. Randolm IdolFletke with call in antibiotic to [facility] [place], [place].  Will forward to PCP.  [name], Tamika L, RN

## 2015-11-17 NOTE — Telephone Encounter (Signed)
Below noted. See most recent telephone note.

## 2015-11-17 NOTE — Telephone Encounter (Signed)
Spoke with her daughter nancy, per dr fletke tell them to contact hospice to get another nurse to obtain urine sample. But if they cant get it successfully bring her in for a same day appt. Deseree Bruna PotterBlount, CMA

## 2015-11-17 NOTE — Telephone Encounter (Signed)
Harriett Sineancy is calling and needs to know what to do. Her mother they think has a UTI and the home aide tried to do a cath but was not successful. They need a urine sample and then if needed antibiotic. She isn't sure if she needs to bring her mother in or hospice will send someone else out to obtain the sample. She really needs to talk to Dr. Randolm IdolFletke concerning her mother. If she doesn't answer her cell please call her work number [phone]. Myriam Jacobsonjw

## 2015-11-20 ENCOUNTER — Telehealth: Payer: Self-pay | Admitting: Family Medicine

## 2015-11-20 DIAGNOSIS — L304 Erythema intertrigo: Secondary | ICD-10-CM

## 2015-11-20 MED ORDER — NYSTATIN 100000 UNIT/GM EX POWD: CUTANEOUS | Status: AC

## 2015-11-20 NOTE — Telephone Encounter (Signed)
Refill of Nystatin sent electronically to pharmacy.

## 2015-11-20 NOTE — Telephone Encounter (Signed)
Daughter called and her mother needs a prescription of Nystatin. Please call in the large bottle. jw

## 2015-11-23 ENCOUNTER — Telehealth: Payer: Self-pay | Admitting: Family Medicine

## 2015-11-23 NOTE — Telephone Encounter (Signed)
Spoke with Lorin PicketScott, he states at one point MD stated that patient maybe should not continue the gabapentin and they believe at this point it may be doing more harm than good. Wanted to speak with MD to see what his thought are, will forward to PCP.

## 2015-11-23 NOTE — Telephone Encounter (Signed)
Son is calling to speak to Dr. Randolm IdolFletke about his mother's medications. He has some questions about her taking a medication or maybe stop staking it. He said that he could also call his sister [phone]. jw

## 2015-11-23 NOTE — Telephone Encounter (Signed)
Discussed gabapentin with Scott. Decision made to stop Gabapentin.

## 2015-11-27 ENCOUNTER — Ambulatory Visit (INDEPENDENT_AMBULATORY_CARE_PROVIDER_SITE_OTHER): Admitting: *Deleted

## 2015-11-27 DIAGNOSIS — R55 Syncope and collapse: Secondary | ICD-10-CM | POA: Diagnosis not present

## 2015-11-28 NOTE — Progress Notes (Signed)
Carelink Summary Report / Loop Recorder 

## 2015-12-05 ENCOUNTER — Other Ambulatory Visit: Payer: Self-pay | Admitting: Family Medicine

## 2015-12-08 ENCOUNTER — Telehealth: Payer: Self-pay | Admitting: Family Medicine

## 2015-12-08 MED ORDER — CEPHALEXIN 500 MG PO CAPS: 500.0000 mg | ORAL_CAPSULE | Freq: Two times a day (BID) | ORAL | Status: DC

## 2015-12-08 NOTE — Telephone Encounter (Signed)
Harriett Sineancy, pt's daughter calls, states that pt passed out yday and that he urine has a foul odor. Hospice is coming out to the home today. She just wanted to make Dr. Randolm IdolFletke aware.

## 2015-12-08 NOTE — Telephone Encounter (Signed)
Bradly BienenstockKisha, RN with [facility] called stating that the family is requesting antibiotics again for possible UTI.  The family requested antibiotics three weeks ago, in January and February. Family stated that patient's urine has an odor and that she is not smiling.  Please give Bradly BienenstockKisha a call with questions [phone]. Per Dr. Randolm IdolFletke, he would give the family a call this afternoon to speak with them regarding their concerns.  [name], Cadie Sorci L, RN

## 2015-12-08 NOTE — Telephone Encounter (Signed)
Spoke with daughter. Patient looks better today. Is alert and smiling. She filled the Keflex but did not give it to her mother. She will not give unless she starts to become symptomatic.

## 2015-12-08 NOTE — Telephone Encounter (Signed)
Spoke with DelphiKisha. She saw patient yesterday and she was in her normal state of health. Family contacted her today about concern for foul spelling urine. Gave verbal to check UA and Culture today if patient not acting self or spiking fevers.

## 2015-12-08 NOTE — Telephone Encounter (Signed)
Spoke to daughter nancy, her mother has had increased somnolence, decreased appetite, and "passing out" over the past 24 hours. Counseled to let Hospice check UA today, will send in Keflex to empirically treat UTI however told nancy to not give unless mother continues to have symptoms. Also notified that loop recorder recently had a small pause and informed them to follow up with cardiology.

## 2015-12-08 NOTE — Telephone Encounter (Signed)
Received call back from Hospice RN. Patient afebrile, looks better today, BP in normal range, did not check UA.

## 2015-12-16 ENCOUNTER — Telehealth: Payer: Self-pay | Admitting: Family Medicine

## 2015-12-16 NOTE — Telephone Encounter (Signed)
Emergency Line / After Hours Call  Patient's daughter, Harriett Sineancy, is calling about the patient. Her mother is currently enrolled in hospice but will continue to have her UTI's treated with antibiotics.  Harriett Sineancy reports her mother is "out of her head" and then this is how she acts when she gets a UTI. Also her urine smells. She thought she had a urinary infection a week ago. Dr. Randolm IdolFletke provided keflex but wants a urine culture for sensitivities. Hospice to be around today and to draw a urine sample.   Called Hospice at [phone] and gave the order to get a urine sample today when they see the patient and prior to the daughter giving antibiotics.   Myra RudeJeremy E Schmitz, MD PGY-3, [facility] 12/16/2015, 1:42 PM

## 2015-12-18 ENCOUNTER — Ambulatory Visit (INDEPENDENT_AMBULATORY_CARE_PROVIDER_SITE_OTHER): Admitting: Family Medicine

## 2015-12-18 ENCOUNTER — Telehealth: Payer: Self-pay | Admitting: Family Medicine

## 2015-12-18 VITALS — BP 178/96 | HR 104 | Temp 98.5°F

## 2015-12-18 DIAGNOSIS — N390 Urinary tract infection, site not specified: Secondary | ICD-10-CM | POA: Diagnosis not present

## 2015-12-18 NOTE — Telephone Encounter (Signed)
Daughter called to say that mother has a urine specimen was taking on Saturday by the Hospice nurse, Clydie BraunKaren  It was being sent out for culture.  Gave mother antibiotics that was called in.

## 2015-12-18 NOTE — Telephone Encounter (Signed)
Spoke with Victoria Lewis. Mother having increase somnolence and decreased urine output over the weekend, Hospice obtained urine culture on Saturday and family started Keflex that has previously been called in (for suspected UTI). Ms. [name] was somewhat imrproved yesterday, however today has white thick urine and increased somnolence. Daughter [name] bringing her to hospital/clinic. Attempted to contact Ann at [phone] with no answer. If patient arrives at clinic will attempt to schedule same day appointment.   Victoria Sineancy also noted that mother had "seizure like activity" Friday night (4/7), increased shaking/decreased level of consciousness.

## 2015-12-18 NOTE — Progress Notes (Signed)
   Subjective:   Victoria Lewis is a 82 y.o. female with a history of Dementia, CVA, complete heart block, HTN, DVT, HLD here for Possible UTI. 7 daughter report that the patient is on hospice now. For the last 3 days she has been more lethargic with decreased awareness of her surroundings. She was able to tell them that she was in pain earlier today with squeezing her hand. Daughter reports that she has had a good appetite and drinking a large bottle water daily. She's had some decreased urine output and urine is tea colored. This is starting to improve. On Saturday hospice collected a urine culture and they started previously called in course of Keflex twice a day 7 days.  Deny any fevers.  Review of Systems:  Per HPI.   Social History: never smoker  Objective:  BP 178/96 mmHg  Pulse 104  Temp(Src) 98.5 F (36.9 C) (Oral)  Gen:  82 y.o. female in NAD, sitting in wheelchair, makes eye contact intermittently HEENT: NCAT, MMM, PERRL, anicteric sclerae CV: RRR, no MRG Resp: Non-labored, CTAB, no wheezes noted Abd: Soft, ND, appears to be mildly TTP suprapubically, BS present, no guarding or organomegaly Ext: WWP, no edema Neuro: Alert and oriented, speech normal    Assessment & Plan:     Victoria HelperBetty C Dibuono is a 82 y.o. female here for ?UTI  UTI (lower urinary tract infection) Possible UTI and patient with recurrent UTIs Decreased awareness likely related to delirium in setting of infection Continue course of Keflex Follow-up as needed    Erasmo DownerAngela M Avelyn Touch, MD MPH PGY-2,  [facility] 12/18/2015  3:35 PM

## 2015-12-18 NOTE — Assessment & Plan Note (Signed)
Possible UTI and patient with recurrent UTIs Decreased awareness likely related to delirium in setting of infection Continue course of Keflex Follow-up as needed

## 2015-12-18 NOTE — Patient Instructions (Signed)

## 2015-12-19 NOTE — Telephone Encounter (Signed)
Returned patient call. Informed Victoria Lewis that I would continue to serve as primary physician is she [facility].

## 2015-12-19 NOTE — Telephone Encounter (Signed)
Nurse [facility] calling to let Dr. Randolm IdolFletke know [facility].  Advised that we were informed by daughter but thanked her for giving us a call .Bryauna Byrum, Maryjo RochesterJessica Dawn, CMA

## 2015-12-19 NOTE — Telephone Encounter (Signed)
Please call Victoria Lewis to discuss continuing being provider to Victoria Lewis if she [facility].  Need to know before nurse come to have them sign documents.

## 2015-12-22 ENCOUNTER — Telehealth: Payer: Self-pay | Admitting: Family Medicine

## 2015-12-22 MED ORDER — CEPHALEXIN 500 MG PO CAPS: 500.0000 mg | ORAL_CAPSULE | Freq: Two times a day (BID) | ORAL | Status: DC

## 2015-12-22 NOTE — Telephone Encounter (Signed)
Received call from patient's daughter on after hours line. Said that the patient was being treated for a UTI with keflex, which seemed to be working, however she lost 2 tablets and was concerned about finishing the course. Overall, she feels like her mother is significantly improved from last week. I told patient that we normally do not fill prescriptions over the phone, but that I would send in 2 extra tablets for her to complete her course of keflex.  Patient's daughter had no further questions.  Katina Degreealeb M. Jimmey RalphParker, MD [facility] Resident PGY-2 12/22/2015 9:58 AM

## 2015-12-26 ENCOUNTER — Ambulatory Visit (INDEPENDENT_AMBULATORY_CARE_PROVIDER_SITE_OTHER): Admitting: *Deleted

## 2015-12-26 DIAGNOSIS — R55 Syncope and collapse: Secondary | ICD-10-CM

## 2015-12-27 NOTE — Progress Notes (Signed)
Carelink Summary Report / Loop Recorder 

## 2015-12-28 ENCOUNTER — Telehealth: Payer: Self-pay | Admitting: Family Medicine

## 2015-12-28 MED ORDER — CEPHALEXIN 500 MG PO CAPS: 500.0000 mg | ORAL_CAPSULE | Freq: Two times a day (BID) | ORAL | Status: DC

## 2015-12-28 MED ORDER — CEPHALEXIN 250 MG PO CAPS: ORAL_CAPSULE | ORAL | Status: DC

## 2015-12-28 NOTE — Telephone Encounter (Signed)
Please call Victoria Lewis back regarding a possible urinary tract infection

## 2015-12-28 NOTE — Telephone Encounter (Signed)
Spoke with son Lorin PicketScott. Patient completed course of Keflex last week, did better during treatment, however now more somnolent, increased urine odor. Will empirically treat for UTI with Keflex 500 mg BID for 7 days, then start prophylactic Keflex 125 mg daily after.

## 2015-12-29 ENCOUNTER — Telehealth: Payer: Self-pay | Admitting: *Deleted

## 2015-12-29 NOTE — Telephone Encounter (Signed)
Pt daughter was able to talk to dr Randolm Idolfletke and they said the antibiotic seemed to be working. But dr Randolm Idolfletke informed her that if things got worse over the weekend, they need to take her to the ED. Deseree Bruna PotterBlount, CMA

## 2015-12-29 NOTE — Telephone Encounter (Signed)
Please call patient/familyand have them bring her in for evaluation. Offer same day if available.

## 2015-12-29 NOTE — Telephone Encounter (Signed)
Daughter [name] calling, states that mother was up and took abx today and ate breakfast but now she is not very responsive and her eyes are somewhat glassed over. Daughter just wants to know if they should just let her rest or if something else should be done. Would like MD to give her a call at [phone]

## 2016-01-12 LAB — CUP PACEART REMOTE DEVICE CHECK: Date Time Interrogation Session: 20170218031338

## 2016-01-25 ENCOUNTER — Ambulatory Visit (INDEPENDENT_AMBULATORY_CARE_PROVIDER_SITE_OTHER): Payer: Medicare Other | Admitting: *Deleted

## 2016-01-25 DIAGNOSIS — R55 Syncope and collapse: Secondary | ICD-10-CM | POA: Diagnosis not present

## 2016-01-26 NOTE — Progress Notes (Signed)
Carelink Summary Report / Loop Recorder 

## 2016-02-03 LAB — CUP PACEART REMOTE DEVICE CHECK: Date Time Interrogation Session: 20170320033521

## 2016-02-03 NOTE — Progress Notes (Signed)
[facility] summary report received. Battery status OK. Normal device function. No new symptom episodes, tachy episodes, brady, or pause episodes. No new AF episodes. Monthly summary reports and ROV/PRN 

## 2016-02-05 LAB — CUP PACEART REMOTE DEVICE CHECK: Date Time Interrogation Session: 20170419033816

## 2016-02-05 NOTE — Progress Notes (Signed)
[facility] summary report received. Battery status OK. Normal device function. No new symptom episodes, tachy episodes, brady, or pause episodes. No new AF episodes. Monthly summary reports and ROV/PRN 

## 2016-02-23 ENCOUNTER — Ambulatory Visit (INDEPENDENT_AMBULATORY_CARE_PROVIDER_SITE_OTHER): Payer: Medicare Other | Admitting: *Deleted

## 2016-02-23 DIAGNOSIS — R55 Syncope and collapse: Secondary | ICD-10-CM

## 2016-02-26 NOTE — Progress Notes (Signed)
Carelink Summary Report / Loop Recorder 

## 2016-03-04 LAB — CUP PACEART REMOTE DEVICE CHECK
Date Time Interrogation Session: 20170519040911
MDC IDC SESS DTM: 20170618040722

## 2016-03-25 ENCOUNTER — Other Ambulatory Visit: Payer: Self-pay | Admitting: *Deleted

## 2016-03-25 MED ORDER — CEPHALEXIN 250 MG PO CAPS: ORAL_CAPSULE | ORAL | Status: DC

## 2016-03-25 NOTE — Telephone Encounter (Signed)
Daughter called and states that patient needs a refill on her keflex. Kalif Kattner,CMA

## 2016-03-26 ENCOUNTER — Ambulatory Visit (INDEPENDENT_AMBULATORY_CARE_PROVIDER_SITE_OTHER): Payer: Medicare Other | Admitting: *Deleted

## 2016-03-26 DIAGNOSIS — R55 Syncope and collapse: Secondary | ICD-10-CM

## 2016-03-26 NOTE — Progress Notes (Signed)
Carelink Summary Report / Loop Recorder 

## 2016-04-25 ENCOUNTER — Ambulatory Visit (INDEPENDENT_AMBULATORY_CARE_PROVIDER_SITE_OTHER): Payer: Medicare Other | Admitting: *Deleted

## 2016-04-25 DIAGNOSIS — R55 Syncope and collapse: Secondary | ICD-10-CM

## 2016-04-25 LAB — CUP PACEART REMOTE DEVICE CHECK: MDC IDC SESS DTM: 20170718043645

## 2016-04-25 NOTE — Progress Notes (Signed)
Carelink Summary Report / Loop Recorder 

## 2016-05-15 ENCOUNTER — Telehealth: Payer: Self-pay | Admitting: Family Medicine

## 2016-05-15 NOTE — Telephone Encounter (Signed)
Michelle from hospice is calling to get verbal orders to be able cut the patients toe nails. Please call [phone] with the verbal orders. jw

## 2016-05-15 NOTE — Telephone Encounter (Signed)
Nursing staff - please call Victoria Lewis from Hospice and give verbal order to cut patient's toe nails.

## 2016-05-15 NOTE — Telephone Encounter (Signed)
Victoria Lewis informed of verbal orders. [name], CMA

## 2016-05-21 LAB — CUP PACEART REMOTE DEVICE CHECK: Date Time Interrogation Session: 20170817043521

## 2016-05-27 ENCOUNTER — Ambulatory Visit (INDEPENDENT_AMBULATORY_CARE_PROVIDER_SITE_OTHER): Payer: Medicare Other | Admitting: *Deleted

## 2016-05-27 DIAGNOSIS — R55 Syncope and collapse: Secondary | ICD-10-CM | POA: Diagnosis not present

## 2016-05-27 NOTE — Progress Notes (Signed)
Carelink Summary Report / Loop Recorder 

## 2016-06-18 ENCOUNTER — Other Ambulatory Visit: Payer: Self-pay | Admitting: *Deleted

## 2016-06-18 MED ORDER — CEPHALEXIN 250 MG PO CAPS: ORAL_CAPSULE | ORAL | 2 refills | Status: AC

## 2016-06-18 NOTE — Telephone Encounter (Signed)
Pt daughter was in office and wanted dr fletke to reRandolm Idolfill pt medicine. Deseree Bruna PotterBlount, CMA

## 2016-06-20 LAB — CUP PACEART REMOTE DEVICE CHECK: MDC IDC SESS DTM: 20170916043730

## 2016-06-20 NOTE — Progress Notes (Signed)
[facility] summary report received. Battery status OK. Normal device function. No new symptom episodes, brady, or pause episodes. No new AF episodes. 1 tachy- ECG previously addressed. Monthly summary reports and ROV/PRN 

## 2016-06-24 ENCOUNTER — Ambulatory Visit (INDEPENDENT_AMBULATORY_CARE_PROVIDER_SITE_OTHER): Payer: Medicare Other | Admitting: *Deleted

## 2016-06-24 DIAGNOSIS — R55 Syncope and collapse: Secondary | ICD-10-CM

## 2016-06-24 NOTE — Progress Notes (Signed)
Carelink Summary Report / Loop Recorder 

## 2016-07-03 ENCOUNTER — Telehealth: Payer: Self-pay | Admitting: Family Medicine

## 2016-07-03 NOTE — Telephone Encounter (Signed)
Prescription placed in fax pile 

## 2016-07-03 NOTE — Telephone Encounter (Signed)
[facility]  Needs written Rx faxed to her at [phone] for roxanol  5mg  per ml to give 5mg  po/sl every 3 hrs as needed for pain or dyspenea

## 2016-07-08 ENCOUNTER — Telehealth: Payer: Self-pay | Admitting: *Deleted

## 2016-07-08 NOTE — Telephone Encounter (Signed)
Called to offer AWV, however, husband answered and stated patient is unable to speak 2/2 to tube insertion.  Kinnie FeilL. Ducatte, RN, BSN

## 2016-07-12 ENCOUNTER — Encounter: Payer: Self-pay | Admitting: Family Medicine

## 2016-07-12 NOTE — Progress Notes (Signed)
Patient passed away on 07/13/2016. Completed Death Certificate.

## 2016-07-15 ENCOUNTER — Telehealth: Payer: Self-pay | Admitting: Family Medicine

## 2016-07-15 NOTE — Telephone Encounter (Signed)
[facility] just wanted to let Dr. Randolm IdolFletke know the pt passed away last week. Please advise. Thanks! ep

## 2016-08-09 DEATH — deceased

## 2016-08-29 NOTE — Progress Notes (Signed)
Opened in error

## 2016-10-10 IMAGING — CR DG CHEST 1V PORT
1 series · 1 of 1 positions shown · non-contrast
Comparison: 03/21/2014

CLINICAL DATA: Tachycardia

EXAM:
PORTABLE CHEST - 1 VIEW

[AP]
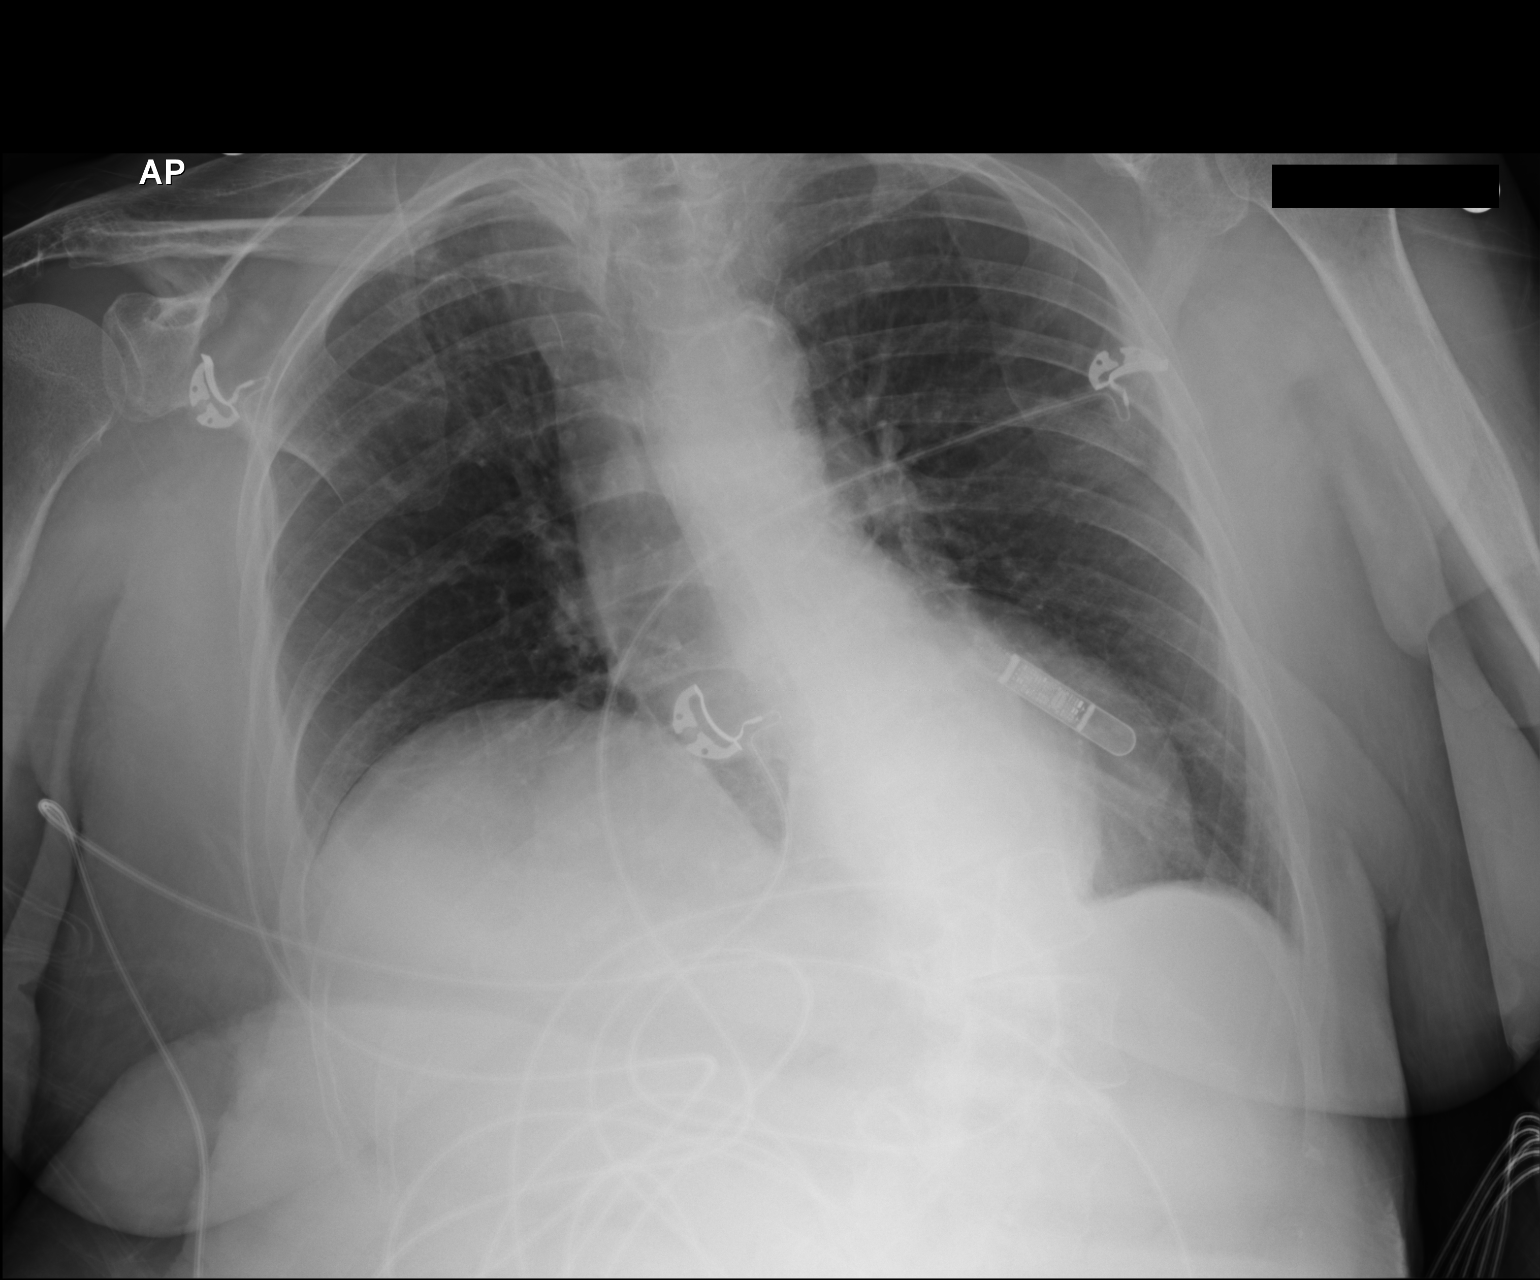

[1 of 1 positions shown; findings below may reference images not displayed]

FINDINGS: Chronic interstitial markings/emphysematous changes. No focal
consolidation. No pleural effusion or pneumothorax.

Eventration of the hemidiaphragm.

Heart is normal size.  Martens monitor overlies the left hemithorax.
IMPRESSION: No evidence of acute cardiopulmonary disease.

## 2016-10-15 ENCOUNTER — Other Ambulatory Visit: Payer: Self-pay | Admitting: Internal Medicine

## 2017-03-15 IMAGING — MR MR MRA HEAD W/O CM
9 of 11 series · 29 of 48 positions shown · non-contrast
Comparison: CT of the head [DATE]/[id number] hours, MRI brain 12/29/2012

ADDENDUM:
Acute on chronic moderate paranasal sinusitis.

By: [REDACTED]AL DATA:  Code stroke, left facial weakness and left arm
weakness, slurred speech. History of hypertension, hyperlipidemia,
chronic kidney disease, dementia, syncope.
EXAM:
MRI HEAD WITHOUT CONTRAST
MRA HEAD WITHOUT CONTRAST
TECHNIQUE: Multiplanar, multiecho pulse sequences of the brain and surrounding
structures were obtained without intravenous contrast. Angiographic
images of the head were obtained using MRA technique without
contrast.

[Series 4: DWI · axial · 3.0mm · 1.09mm/px · z∈[-24,+105]mm · 7 of 92 slices shown (1 of 4)]
[im 1/92]
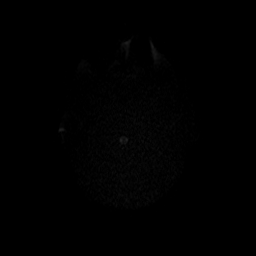
[im 16/92]
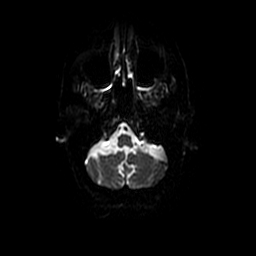
[im 31/92]
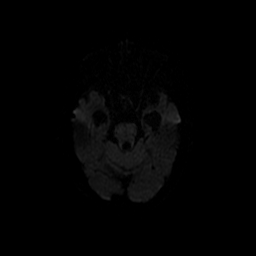
[im 46/92]
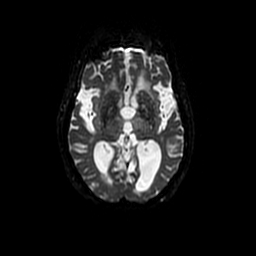
[im 61/92]
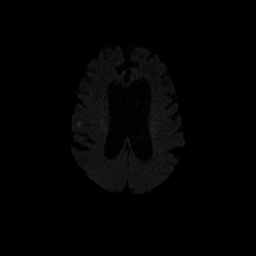
[im 76/92]
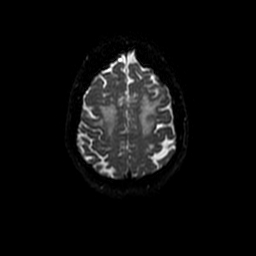
[im 92/92]
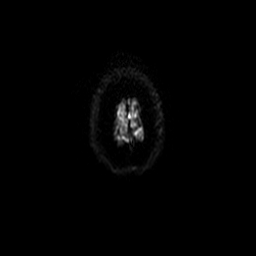

[Series 5: (id) mt fs · axial · 1.2mm · 0.43mm/px · z∈[-61,-27]mm · 3 of 174 slices shown]
[im 1/174]
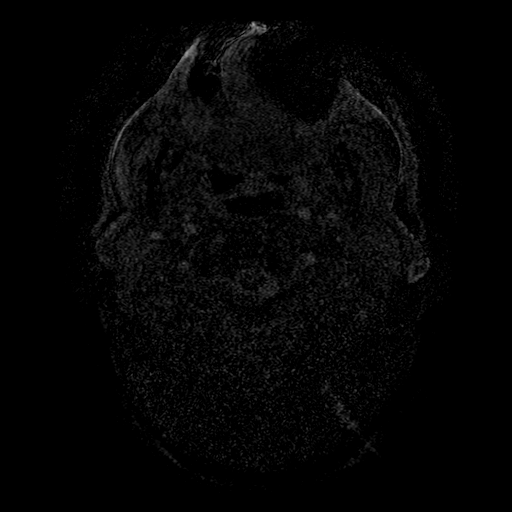
[im 29/174]
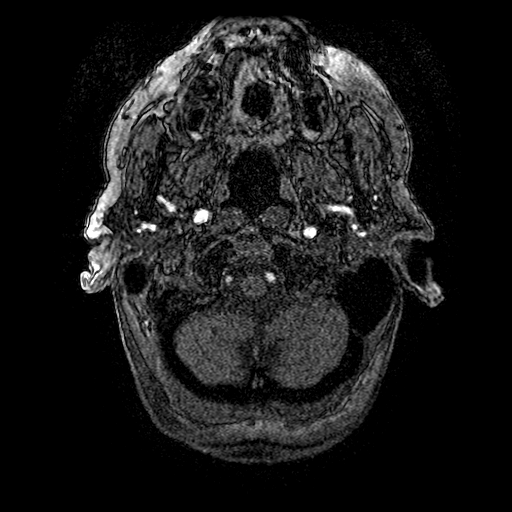
[im 58/174]
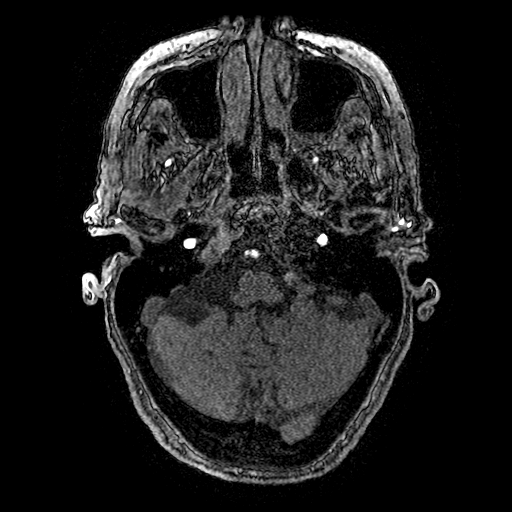

[Series 6: T1 · sagittal · 5.0mm · 0.47mm/px · 2 of 21 slices shown]
[im 1/21]
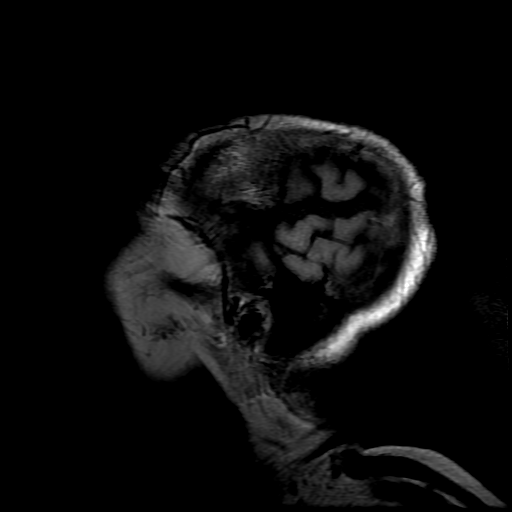
[im 21/21]
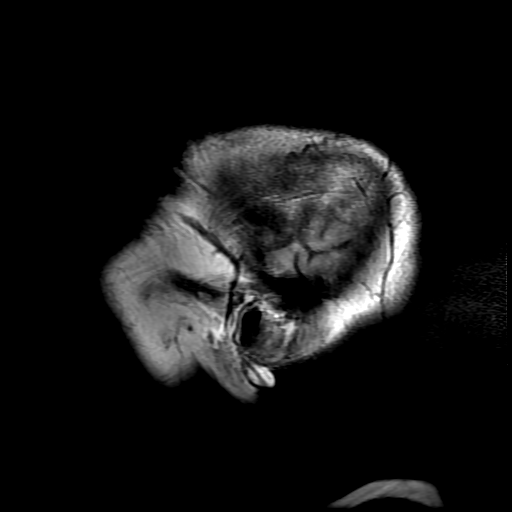

[Series 7: T2 · axial · 5.0mm · 0.43mm/px · z∈[-42,+91]mm · 2 of 24 slices shown (1 of 2)]
[im 1/24]
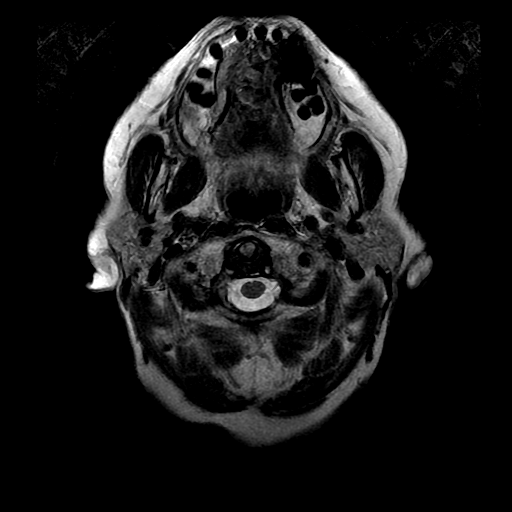
[im 24/24]
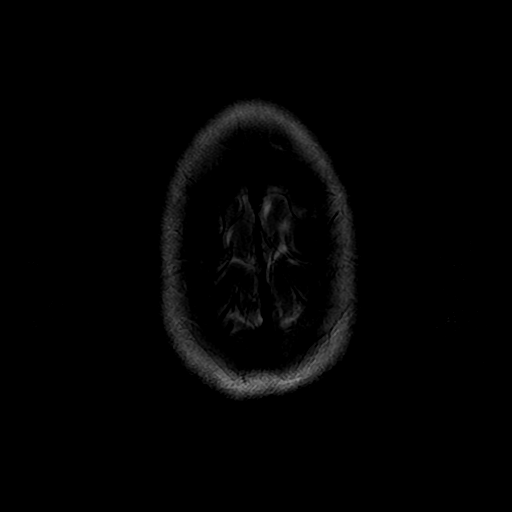

[Series 8: FLAIR · axial · 5.0mm · 0.43mm/px · z∈[-42,+91]mm · 2 of 24 slices shown]
[im 1/24]
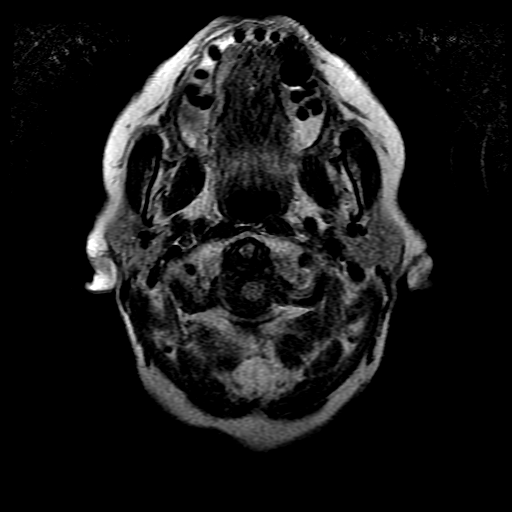
[im 24/24]
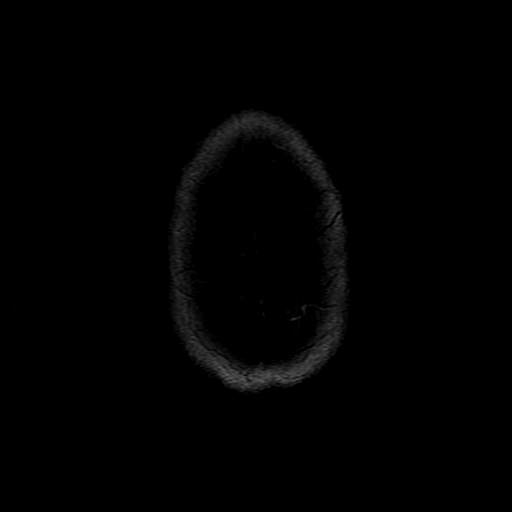

[Series 11: DWI · coronal · 5.0mm · 1.09mm/px · 5 of 66 slices shown (2 of 4)]
[im 1/66]
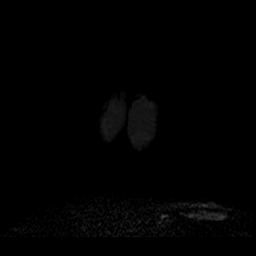
[im 17/66]
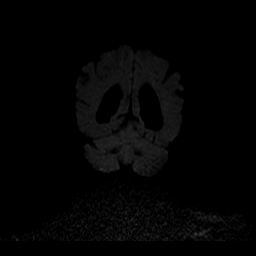
[im 33/66]
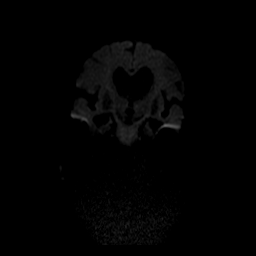
[im 49/66]
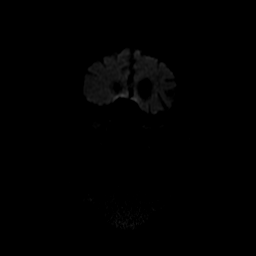
[im 66/66]
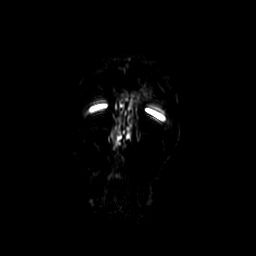

[Series 12: T2 · coronal · 5.0mm · 0.39mm/px · 3 of 34 slices shown (2 of 2)]
[im 1/34]
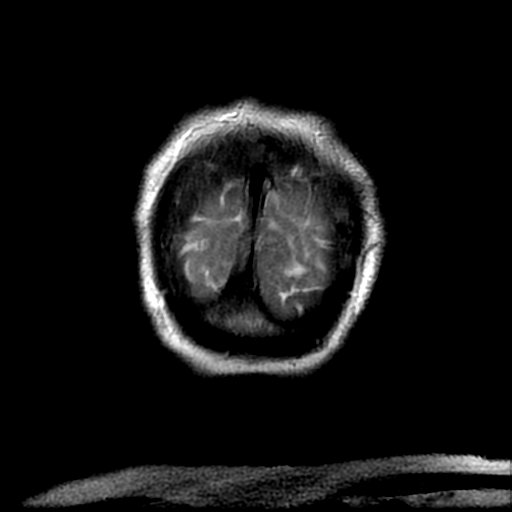
[im 17/34]
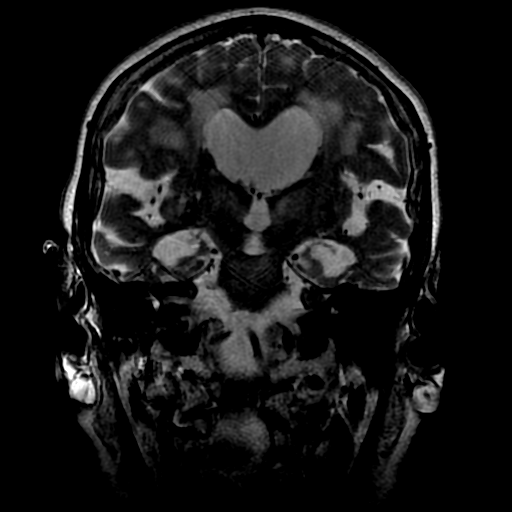
[im 34/34]
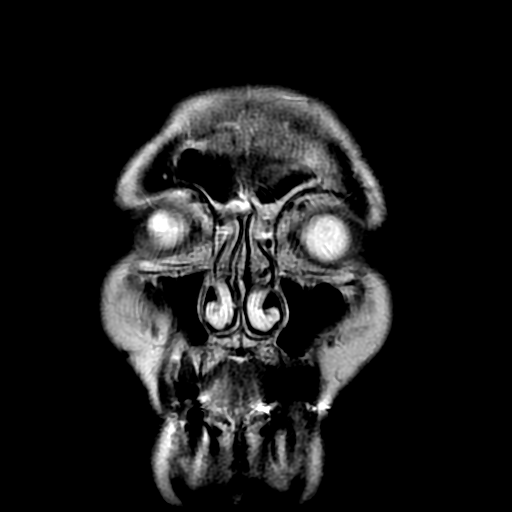

[Series 400: DWI · axial · 3.0mm · 1.09mm/px · z∈[-24,+105]mm · 3 of 46 slices shown (3 of 4)]
[im 1/46]
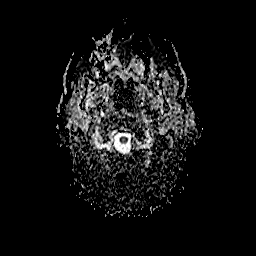
[im 23/46]
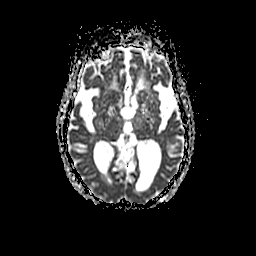
[im 46/46]
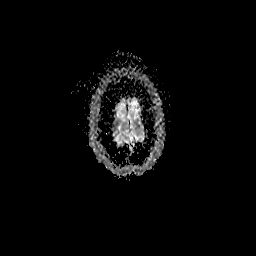

[Series 1100: DWI · coronal · 5.0mm · 1.09mm/px · 2 of 33 slices shown (4 of 4)]
[im 1/33]
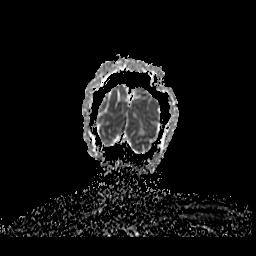
[im 33/33]
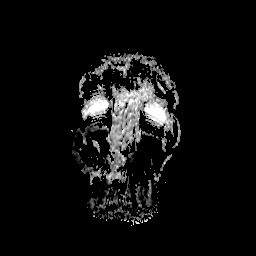

[29 of 48 positions shown; findings below may reference images not displayed]

FINDINGS: MRI HEAD FINDINGS

At least 2 subcentimeter foci of reduced diffusion in RIGHT
posterior frontal lobe, with low ADC values. In addition, faint
reduced diffusion in a linear fashion within the RIGHT posterior
frontal lobe, with equivocal decreased ADC values. No susceptibility
artifact to suggest hemorrhage.

Moderate to severe ventriculomegaly, likely on the basis of global
parenchymal brain volume loss as there is overall commensurate
enlargement of cerebral sulci and cerebellar folia, with slight
sulcal effacement at the convexities. Confluent supratentorial and
pontine white matter FLAIR T2 hyperintensities most consistent with
chronic small vessel ischemic disease. Small remote bilateral
cerebellar infarcts, some of which are new from prior MRI. Bilateral
remote basal ganglia lacunar infarcts, some of which are new, for
example LEFT caudate head.

No abnormal extra-axial fluid collections. Status post bilateral
ocular lens implants. Moderate paranasal sinus mucosal thickening
with scattered air-fluid levels. The mastoid air cells are well
aerated. No abnormal sellar expansion. No cerebellar tonsillar
ectopia. Generalized bright T1 bone marrow signal consistent with
osteopenia.

MRA HEAD FINDINGS

Mild motion degraded examination.

Anterior circulation: Normal flow related enhancement of the
cervical, cavernous internal carotid arteries. Mild luminal
irregularity of the bilateral cavernous segments corresponding to
dense calcification on prior CT. Normal flow related enhancement
supraclinoid internal carotid arteries. Normal flow related
enhancement anterior and middle cerebral arteries with patent
anterior communicating artery present.

Posterior circulation: LEFT vertebral artery is dominant with normal
flow early enhancement of rete tuber arteries, vertebrobasilar
junction, basilar artery and main branch vessels. Normal flow early
enhanced with bilateral posterior cerebral arteries.

No large vessel occlusion. At mid high-grade stenosis distal LEFT M1
segment. High-grade stenosis of LEFT mid A2 segment, vessel remains
patent. Focal loss of LEFT P2/3 junction consistent with high-grade
stenosis, with minimal flow related enhancement distally.
IMPRESSION: MRI HEAD: At least 2 subcentimeter foci of rete of acute ischemia in
RIGHT middle cerebral artery territory. Faint reduced diffusion in
RIGHT posterior frontal lobe could reflect subacute infarct or,
artifact.

Moderate to severe global parenchymal brain volume loss with
suspected component of normal pressure hydrocephalus.

Severe white matter changes most consistent with chronic small
vessel ischemic disease. Remote bilateral basal ganglia and RIGHT
greater than LEFT small cerebellar infarcts, advanced from prior
imaging.

MRA HEAD: Multifocal mid to high-grade and, high-grade stenosis
involving the anterior and posterior circulation without large
vessel occlusion. Findings most consistent with intracranial
atherosclerosis.

By: Quanda Cao

## 2017-08-06 IMAGING — CT CT HEAD W/O CM
1 of 2 series · 16 of 30 positions shown, 20 images · non-contrast
Comparison: 12/19/2014 CT and MRI and prior studies.

CLINICAL DATA: 87-year-old female with syncope and altered mental
status today.

EXAM:
CT HEAD WITHOUT CONTRAST
TECHNIQUE: Contiguous axial images were obtained from the base of the skull
through the vertex without intravenous contrast.

[Series 3: head 2.0 h70h · axial · 0.43mm/px · z∈[+167,+309]mm · 16 of 79 slices shown, 20 images]
[im 4/79  brain]
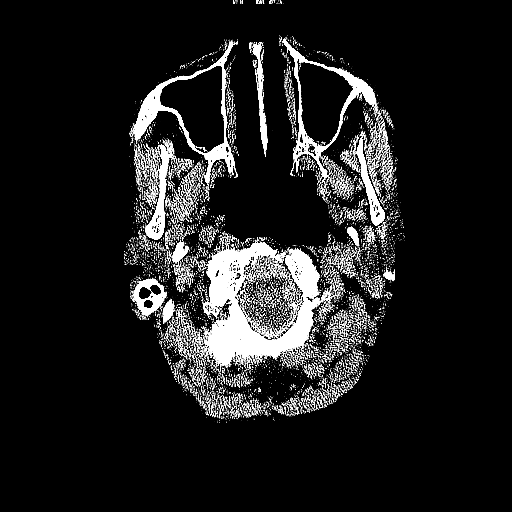
[im 4/79  bone]
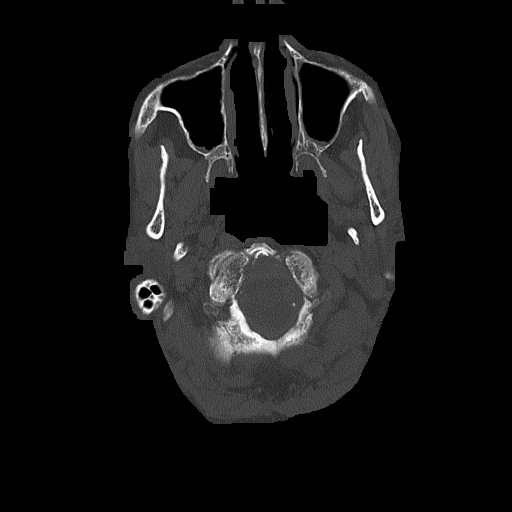
[im 8/79  brain]
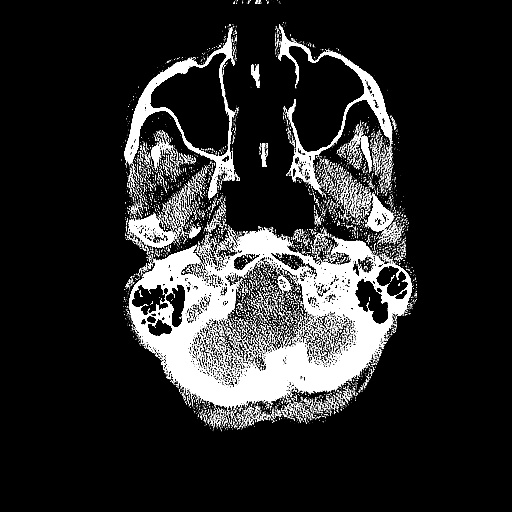
[im 15/79  brain]
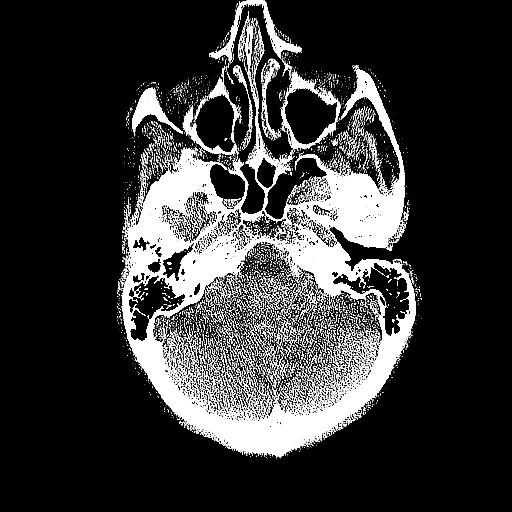
[im 19/79  brain]
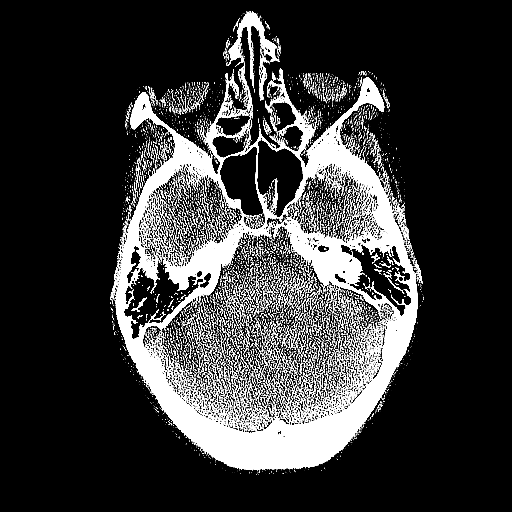
[im 23/79  brain]
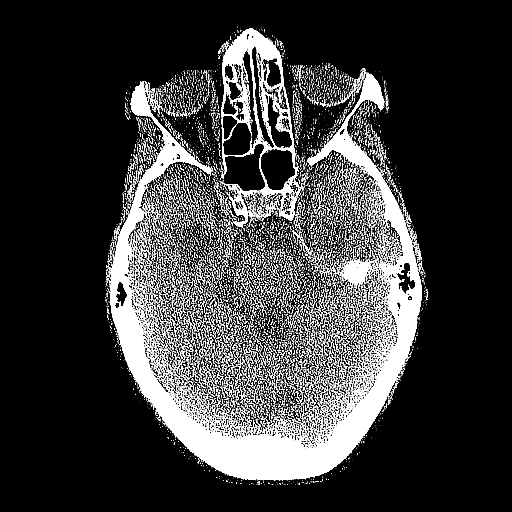
[im 23/79  bone]
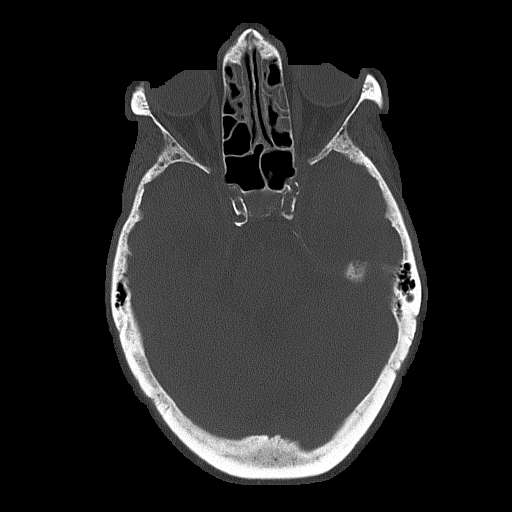
[im 27/79  brain]
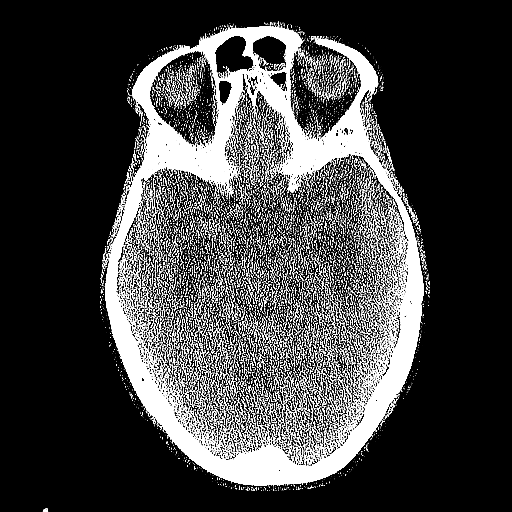
[im 34/79  brain]
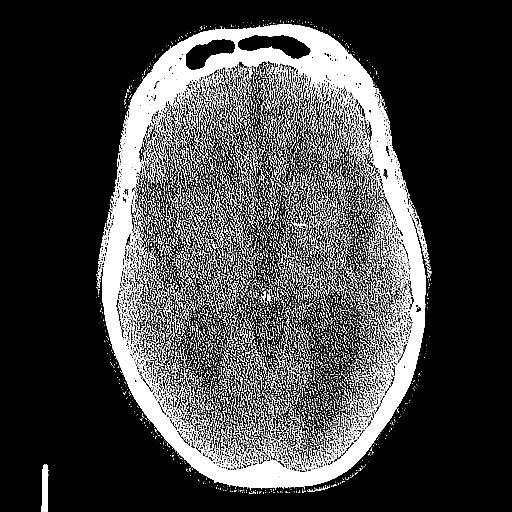
[im 38/79  brain]
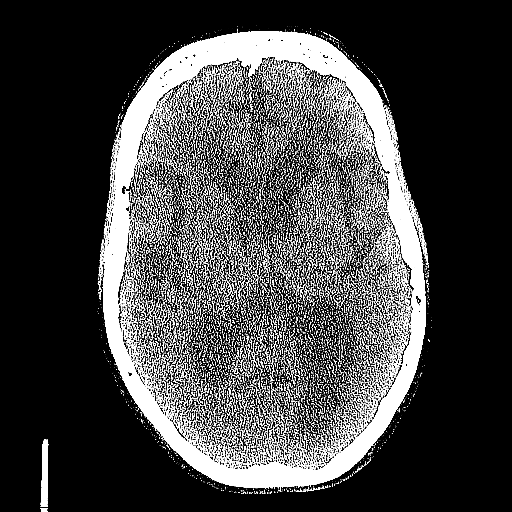
[im 41/79  brain]
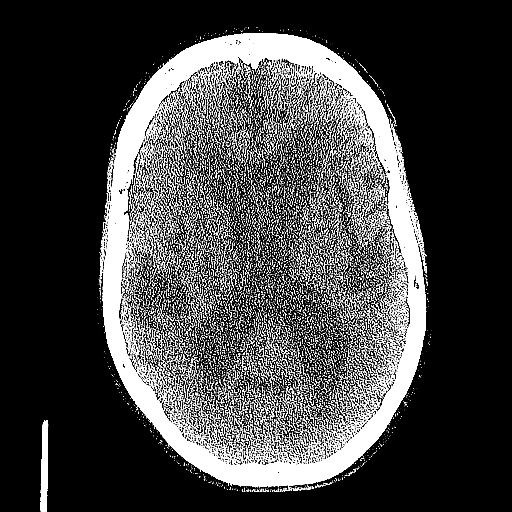
[im 41/79  bone]
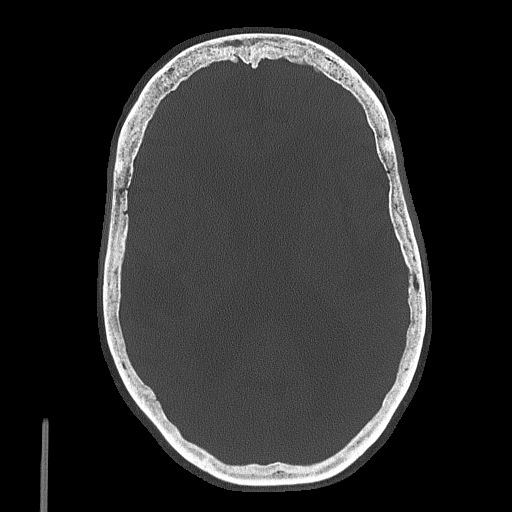
[im 45/79  brain]
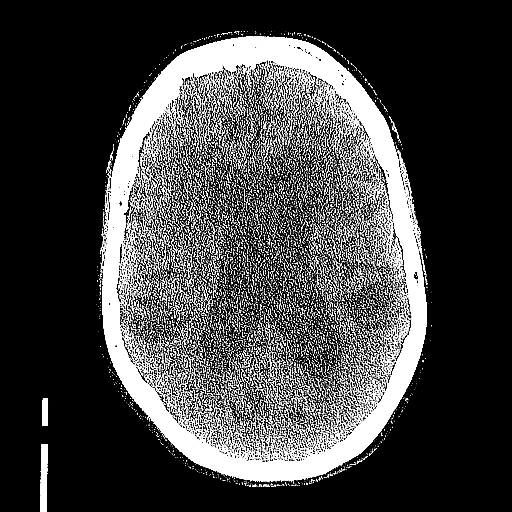
[im 53/79  brain]
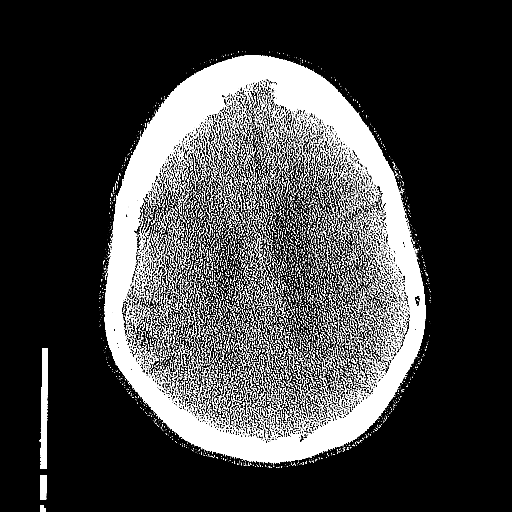
[im 56/79  brain]
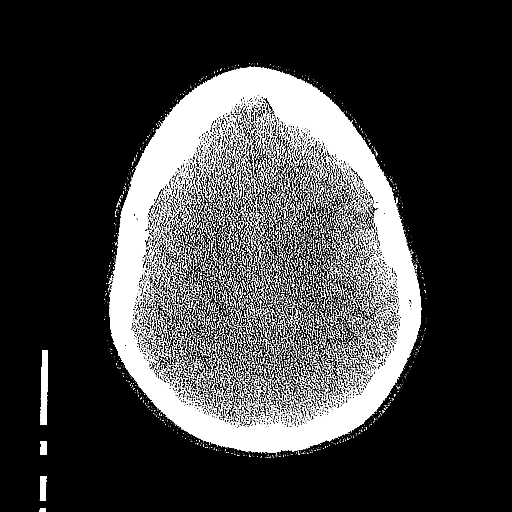
[im 60/79  brain]
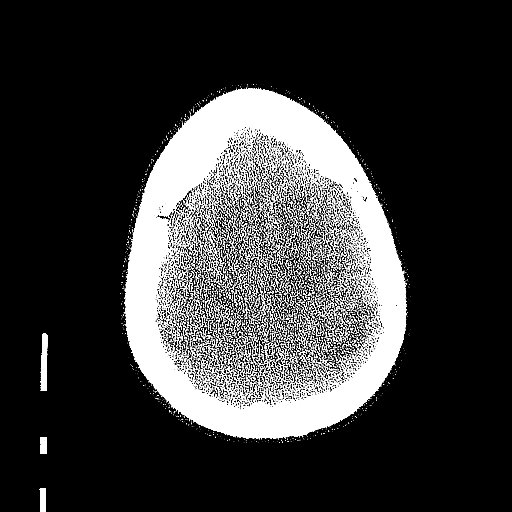
[im 60/79  bone]
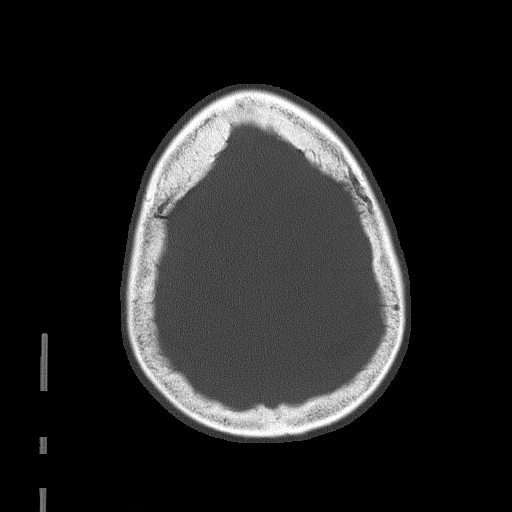
[im 64/79  brain]
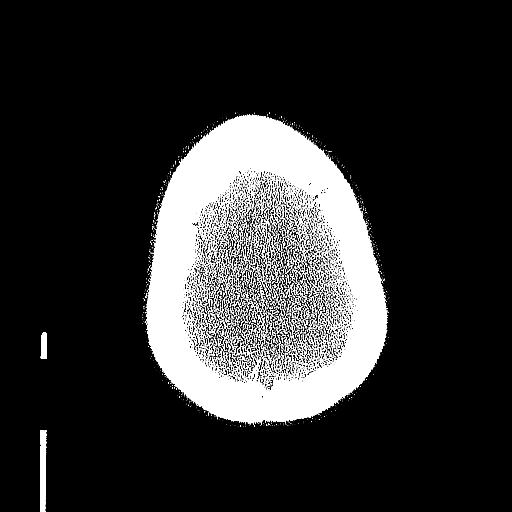
[im 71/79  brain]
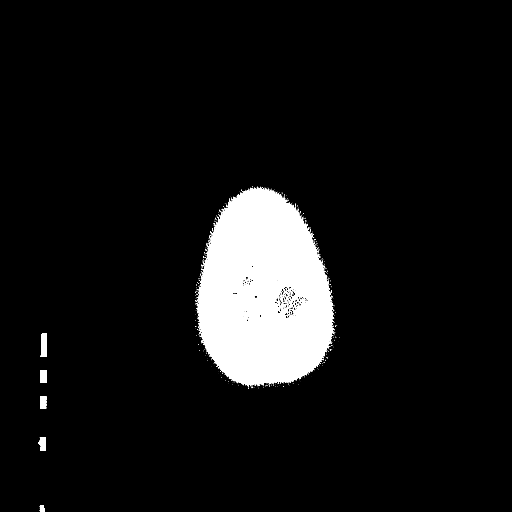
[im 75/79  brain]
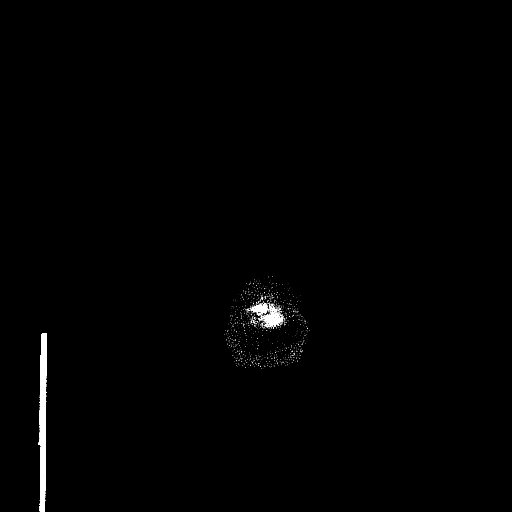

[16 of 30 positions shown; findings below may reference images not displayed]

FINDINGS: Atrophy and severe chronic small-vessel white matter ischemic
changes again noted.

No acute intracranial abnormalities are identified, including mass
lesion or mass effect, hydrocephalus, extra-axial fluid collection,
midline shift, hemorrhage, or acute infarction.

A small amount of fluid within scattered paranasal sinuses again
noted.
IMPRESSION: No evidence of acute intracranial abnormality.

Atrophy and severe chronic small-vessel white matter ischemic
changes.

Unchanged sinus disease compatible with acute sinusitis.
